# Patient Record
Sex: Male | Born: 1940
Health system: Southern US, Community
[De-identification: ages and names within clinical notes are randomized; demographics above are authoritative.]

## PROBLEM LIST (undated history)

## (undated) DIAGNOSIS — I2510 Atherosclerotic heart disease of native coronary artery without angina pectoris: Secondary | ICD-10-CM

## (undated) DIAGNOSIS — G2581 Restless legs syndrome: Secondary | ICD-10-CM

## (undated) DIAGNOSIS — F32A Depression, unspecified: Secondary | ICD-10-CM

## (undated) DIAGNOSIS — I10 Essential (primary) hypertension: Secondary | ICD-10-CM

## (undated) DIAGNOSIS — F329 Major depressive disorder, single episode, unspecified: Secondary | ICD-10-CM

## (undated) DIAGNOSIS — I255 Ischemic cardiomyopathy: Secondary | ICD-10-CM

## (undated) DIAGNOSIS — G4733 Obstructive sleep apnea (adult) (pediatric): Principal | ICD-10-CM

## (undated) DIAGNOSIS — Z9289 Personal history of other medical treatment: Secondary | ICD-10-CM

## (undated) DIAGNOSIS — C801 Malignant (primary) neoplasm, unspecified: Secondary | ICD-10-CM

## (undated) DIAGNOSIS — M1990 Unspecified osteoarthritis, unspecified site: Secondary | ICD-10-CM

## (undated) DIAGNOSIS — J302 Other seasonal allergic rhinitis: Secondary | ICD-10-CM

## (undated) DIAGNOSIS — E785 Hyperlipidemia, unspecified: Secondary | ICD-10-CM

## (undated) HISTORY — DX: Personal history of other medical treatment: Z92.89

## (undated) HISTORY — PX: TONSILLECTOMY: SUR1361

## (undated) HISTORY — PX: OTHER SURGICAL HISTORY: SHX169

## (undated) HISTORY — DX: Essential (primary) hypertension: I10

## (undated) HISTORY — DX: Malignant (primary) neoplasm, unspecified: C80.1

## (undated) HISTORY — DX: Depression, unspecified: F32.A

## (undated) HISTORY — DX: Hyperlipidemia, unspecified: E78.5

## (undated) HISTORY — DX: Other seasonal allergic rhinitis: J30.2

## (undated) HISTORY — DX: Restless legs syndrome: G25.81

## (undated) HISTORY — DX: Atherosclerotic heart disease of native coronary artery without angina pectoris: I25.10

## (undated) HISTORY — DX: Major depressive disorder, single episode, unspecified: F32.9

## (undated) HISTORY — PX: COLONOSCOPY: SHX174

## (undated) HISTORY — DX: Obstructive sleep apnea (adult) (pediatric): G47.33

## (undated) HISTORY — DX: Ischemic cardiomyopathy: I25.5

## (undated) HISTORY — PX: NOSE SURGERY: SHX723

## (undated) HISTORY — DX: Unspecified osteoarthritis, unspecified site: M19.90

---

## 2002-05-02 ENCOUNTER — Encounter (INDEPENDENT_AMBULATORY_CARE_PROVIDER_SITE_OTHER): Payer: Self-pay | Admitting: Specialist

## 2002-05-02 ENCOUNTER — Ambulatory Visit (HOSPITAL_COMMUNITY): Admission: RE | Admit: 2002-05-02 | Discharge: 2002-05-02 | Payer: Self-pay | Admitting: *Deleted

## 2003-12-01 ENCOUNTER — Encounter: Admission: RE | Admit: 2003-12-01 | Discharge: 2003-12-01 | Payer: Self-pay | Admitting: Internal Medicine

## 2007-09-25 ENCOUNTER — Ambulatory Visit (HOSPITAL_COMMUNITY): Admission: RE | Admit: 2007-09-25 | Discharge: 2007-09-25 | Payer: Self-pay | Admitting: *Deleted

## 2008-02-01 ENCOUNTER — Ambulatory Visit (HOSPITAL_BASED_OUTPATIENT_CLINIC_OR_DEPARTMENT_OTHER): Admission: RE | Admit: 2008-02-01 | Discharge: 2008-02-01 | Payer: Self-pay | Admitting: Orthopedic Surgery

## 2008-02-22 ENCOUNTER — Ambulatory Visit (HOSPITAL_BASED_OUTPATIENT_CLINIC_OR_DEPARTMENT_OTHER): Admission: RE | Admit: 2008-02-22 | Discharge: 2008-02-22 | Payer: Self-pay | Admitting: Orthopedic Surgery

## 2010-01-22 ENCOUNTER — Inpatient Hospital Stay (HOSPITAL_COMMUNITY): Admission: EM | Admit: 2010-01-22 | Discharge: 2010-01-24 | Payer: Self-pay | Admitting: Emergency Medicine

## 2010-01-22 ENCOUNTER — Ambulatory Visit: Payer: Self-pay | Admitting: Internal Medicine

## 2010-01-29 ENCOUNTER — Encounter: Payer: Self-pay | Admitting: Cardiovascular Disease

## 2010-02-08 ENCOUNTER — Encounter (INDEPENDENT_AMBULATORY_CARE_PROVIDER_SITE_OTHER): Payer: Self-pay | Admitting: *Deleted

## 2010-02-11 ENCOUNTER — Encounter (HOSPITAL_COMMUNITY): Admission: RE | Admit: 2010-02-11 | Discharge: 2010-03-26 | Payer: Self-pay | Admitting: Cardiovascular Disease

## 2010-02-11 ENCOUNTER — Encounter: Payer: Self-pay | Admitting: Cardiovascular Disease

## 2010-02-23 ENCOUNTER — Ambulatory Visit: Payer: Self-pay | Admitting: Cardiovascular Disease

## 2010-02-23 DIAGNOSIS — E7800 Pure hypercholesterolemia, unspecified: Secondary | ICD-10-CM | POA: Insufficient documentation

## 2010-02-23 DIAGNOSIS — I2510 Atherosclerotic heart disease of native coronary artery without angina pectoris: Secondary | ICD-10-CM | POA: Insufficient documentation

## 2010-02-24 ENCOUNTER — Encounter: Payer: Self-pay | Admitting: Cardiovascular Disease

## 2010-02-24 ENCOUNTER — Telehealth (INDEPENDENT_AMBULATORY_CARE_PROVIDER_SITE_OTHER): Payer: Self-pay | Admitting: *Deleted

## 2010-02-25 ENCOUNTER — Telehealth: Payer: Self-pay | Admitting: Cardiovascular Disease

## 2010-03-02 ENCOUNTER — Ambulatory Visit: Payer: Self-pay | Admitting: Cardiovascular Disease

## 2010-03-05 ENCOUNTER — Encounter: Payer: Self-pay | Admitting: Cardiology

## 2010-03-08 ENCOUNTER — Telehealth (INDEPENDENT_AMBULATORY_CARE_PROVIDER_SITE_OTHER): Payer: Self-pay | Admitting: *Deleted

## 2010-03-09 ENCOUNTER — Encounter: Payer: Self-pay | Admitting: Cardiology

## 2010-03-09 ENCOUNTER — Ambulatory Visit: Payer: Self-pay | Admitting: Cardiology

## 2010-03-09 ENCOUNTER — Encounter (HOSPITAL_COMMUNITY): Admission: RE | Admit: 2010-03-09 | Discharge: 2010-04-28 | Payer: Self-pay | Admitting: Internal Medicine

## 2010-03-09 ENCOUNTER — Ambulatory Visit: Payer: Self-pay

## 2010-03-10 ENCOUNTER — Telehealth: Payer: Self-pay | Admitting: Cardiovascular Disease

## 2010-03-12 ENCOUNTER — Ambulatory Visit (HOSPITAL_COMMUNITY): Admission: RE | Admit: 2010-03-12 | Discharge: 2010-03-12 | Payer: Self-pay | Admitting: Internal Medicine

## 2010-03-12 ENCOUNTER — Ambulatory Visit: Payer: Self-pay

## 2010-03-12 ENCOUNTER — Ambulatory Visit: Payer: Self-pay | Admitting: Internal Medicine

## 2010-03-16 ENCOUNTER — Telehealth: Payer: Self-pay | Admitting: Cardiovascular Disease

## 2010-03-24 ENCOUNTER — Telehealth: Payer: Self-pay | Admitting: Cardiovascular Disease

## 2010-03-26 ENCOUNTER — Ambulatory Visit: Payer: Self-pay | Admitting: Internal Medicine

## 2010-03-26 DIAGNOSIS — I495 Sick sinus syndrome: Secondary | ICD-10-CM | POA: Insufficient documentation

## 2010-03-27 ENCOUNTER — Encounter (HOSPITAL_COMMUNITY)
Admission: RE | Admit: 2010-03-27 | Discharge: 2010-06-14 | Payer: Self-pay | Source: Home / Self Care | Attending: Cardiovascular Disease | Admitting: Cardiovascular Disease

## 2010-04-07 ENCOUNTER — Encounter: Payer: Self-pay | Admitting: Cardiovascular Disease

## 2010-04-20 ENCOUNTER — Ambulatory Visit: Payer: Self-pay | Admitting: Cardiovascular Disease

## 2010-05-12 ENCOUNTER — Encounter: Payer: Self-pay | Admitting: Cardiovascular Disease

## 2010-07-02 ENCOUNTER — Encounter: Payer: Self-pay | Admitting: Cardiovascular Disease

## 2010-07-27 NOTE — Progress Notes (Signed)
Summary: questions re appt  Phone Note Call from Patient   Caller: Patient [phone] Reason for Call: Talk to Nurse Summary of Call: pt was referred to dr allred, by dr cooper for poss pacemaker, pt had echo first and he was told it was normal and he wouldn't need the pacemaker, does he still need to see dr allred? [phone] Initial call taken by: Glynda Jaeger,  March 24, 2010 10:35 AM  Follow-up for Phone Call        I spoke with the pt and made him aware that we would like him to keep his appt with Dr Johney Frame. The pt's echo was okay but he still needs to be evaluated for arrhythmia on holter monitor.  Follow-up by: Julieta Gutting, RN, BSN,  March 24, 2010 10:53 AM

## 2010-07-27 NOTE — Assessment & Plan Note (Signed)
Summary: 2 month rov   Visit Type:  Follow-up Referring Provider:  Dr Excell Seltzer Primary Provider:  Pearson Grippe, M.D.  CC:  No complaints.  History of Present Illness: 70 year-old male presents for followup evaluation of CAD following an inferior STEMI July 2011. His LVEF has returned to normal at 60%. He was evaluated by Dr Johney Frame for an idioventricular rhythm and bradycardia - he has had no symptoms since cessation of his beta-blocker. Pt is participating in cardiac rehab. He has no complaints today. The patient had a followup Myoview scan to assess residual CAD in the LAD and LCx territories and this showed no ischemia.  He is scheduled to see Dr Selena Batten next month with labwork at that time.  Current Medications (verified): 1)  Nitrostat 0.4 Mg Subl (Nitroglycerin) .Marland Kitchen.. 1 Tablet Under Tongue At Onset of Chest Pain; You May Repeat Every 5 Minutes For Up To 3 Doses. 2)  Effient 10 Mg Tabs (Prasugrel Hcl) .... Take One Tablet By Mouth Daily 3)  Lisinopril 5 Mg Tabs (Lisinopril) .... Take One Tablet By Mouth Daily 4)  Aspirin 81 Mg Tbec (Aspirin) .... Take One Tablet By Mouth Daily 5)  Crestor 40 Mg Tabs (Rosuvastatin Calcium) .... Take One Tablet By Mouth Daily. 6)  Allegra 180 Mg Tabs (Fexofenadine Hcl) .... Take 1 Tablet By Mouth Once A Day 7)  Calcium 600+d Plus Minerals 600-400 Mg-Unit Tabs (Calcium Carbonate-Vit D-Min) .... Take 1 Tablet By Mouth Once A Day 8)  Metformin Hcl 500 Mg Tabs (Metformin Hcl) .... Take 1 Tablet By Mouth Two Times A Day 9)  Multivitamins  Tabs (Multiple Vitamin) .... Take 1 Tablet By Mouth Once A Day 10)  Metamucil 30.9 % Powd (Psyllium) .... Once A Day 11)  Azelastine Hcl 137 Mcg/spray Soln (Azelastine Hcl) .... As Needed 12)  Perforomist 20 Mcg/35ml Nebu (Formoterol Fumarate) .... As Needed  Allergies (verified): No Known Drug Allergies  Past History:  Past medical history reviewed for relevance to current acute and chronic problems.  Past Medical  History: Reviewed history from 03/26/2010 and no changes required.  Acute inferior ST-elevation myocardial infarction July 2011 s/p PCI RCA  Non-insulin-dependent diabetes mellitus.   Hypertension.   Hyperlipidemia.   DJD  Seasonal Allergies  Review of Systems       Positive for episodic GI upset/diarrhea, chronic low back pain, otherwise negative except as per HPI.  Vital Signs:  Patient profile:   70 year old male Height:      69 inches Weight:      173.50 pounds BMI:     25.71 Pulse rate:   60 / minute Pulse rhythm:   regular Resp:     18 per minute BP sitting:   122 / 64  (left arm) Cuff size:   large  Vitals Entered By: Vikki Ports (April 20, 2010 10:01 AM)  Physical Exam  General:  Pt is alert and oriented, in no acute distress. HEENT: normal Neck: normal carotid upstrokes without bruits, JVP normal Lungs: CTA CV: RRR without murmur or gallop Abd: soft, NT, positive BS, no bruit, no organomegaly Ext: no clubbing, cyanosis, or edema. peripheral pulses 2+ and equal Skin: warm and dry without rash    Impression & Recommendations:  Problem # 1:  CORONARY ATHEROSCLEROSIS NATIVE CORONARY ARTERY (ICD-414.01) Pt stable post-MI. LVEF has normalized and he has no angina with activity. He is participitating in Phase 2 cardiac rehab. Would continue current Rx and will plan on followup in 6 months.  His updated medication list for this problem includes:    Nitrostat 0.4 Mg Subl (Nitroglycerin) .Marland Kitchen... 1 tablet under tongue at onset of chest pain; you may repeat every 5 minutes for up to 3 doses.    Effient 10 Mg Tabs (Prasugrel hcl) .Marland Kitchen... Take one tablet by mouth daily    Lisinopril 5 Mg Tabs (Lisinopril) .Marland Kitchen... Take one tablet by mouth daily    Aspirin 81 Mg Tbec (Aspirin) .Marland Kitchen... Take one tablet by mouth daily  Problem # 2:  HYPERCHOLESTEROLEMIA (ICD-272.0) Upcoming labs with Dr Selena Batten next month.  His updated medication list for this problem includes:    Crestor 40 Mg  Tabs (Rosuvastatin calcium) .Marland Kitchen... Take one tablet by mouth daily.  Problem # 3:  SINOATRIAL NODE DYSFUNCTION (ICD-427.81) HR improved off of beta blocker. Continue to follow.  His updated medication list for this problem includes:    Nitrostat 0.4 Mg Subl (Nitroglycerin) .Marland Kitchen... 1 tablet under tongue at onset of chest pain; you may repeat every 5 minutes for up to 3 doses.    Effient 10 Mg Tabs (Prasugrel hcl) .Marland Kitchen... Take one tablet by mouth daily    Lisinopril 5 Mg Tabs (Lisinopril) .Marland Kitchen... Take one tablet by mouth daily    Aspirin 81 Mg Tbec (Aspirin) .Marland Kitchen... Take one tablet by mouth daily  Patient Instructions: 1)  Your physician recommends that you continue on your current medications as directed. Please refer to the Current Medication list given to you today. 2)  Your physician wants you to follow-up in: 6 MONTHS.  You will receive a reminder letter in the mail two months in advance. If you don't receive a letter, please call our office to schedule the follow-up appointment.

## 2010-07-27 NOTE — Progress Notes (Signed)
Summary: Need Monitor  Phone Note Outgoing Call   Call placed by: Julieta Gutting, RN, BSN,  February 25, 2010 7:44 PM Call placed to: Patient Summary of Call: I spoke with the pt and made him aware that Dr Excell Seltzer reviewed his cardiac strips from rehab.  This showed an Idioventricular rhythm in the 60s.  Dr Excell Seltzer would like the pt to wear a 48 hour holter monitor.  Order sent to [facility] to schedule.  Pt aware that our office will call with appointment.  Initial call taken by: Julieta Gutting, RN, BSN,  February 25, 2010 7:44 PM

## 2010-07-27 NOTE — Miscellaneous (Signed)
Summary: update meds  Clinical Lists Changes  Medications: Added new medication of METOPROLOL SUCCINATE 25 MG XR24H-TAB (METOPROLOL SUCCINATE) Take one tablet by mouth daily Added new medication of NITROSTAT 0.4 MG SUBL (NITROGLYCERIN) 1 tablet under tongue at onset of chest pain; you may repeat every 5 minutes for up to 3 doses. Added new medication of EFFIENT 10 MG TABS (PRASUGREL HCL) Take one tablet by mouth daily Added new medication of LISINOPRIL 2.5 MG TABS (LISINOPRIL) Take one tablet by mouth daily Added new medication of ASPIRIN EC 325 MG TBEC (ASPIRIN) Take one tablet by mouth daily

## 2010-07-27 NOTE — Progress Notes (Signed)
Summary: Nuclear pre procedure  Phone Note Outgoing Call Call back at [facility] Phone [phone]   Call placed by: Rea College, CMA,  March 08, 2010 3:15 PM Call placed to: Patient Summary of Call: Left message with information on Myoview Information Sheet (see scanned document for details).      Nuclear Med Background Indications for Stress Test: Evaluation for Ischemia, Stent Patency, Post Hospital  Indications Comments: evaluation of LAD and LCFX  History: Angioplasty, Heart Catheterization, Myocardial Perfusion Study, Stents  History Comments: 7/11 Stent-RCA with moderate residual CAD  Symptoms: Chest Pain  Symptoms Comments: CP>jaw.   Nuclear Pre-Procedure Cardiac Risk Factors: Hypertension, Lipids, NIDDM Height (in): 69

## 2010-07-27 NOTE — Miscellaneous (Signed)
Summary: [place] [facility] Progress Note   [place] [facility] [facility] Progress Note   Imported By: Roderic Ovens 05/03/2010 09:14:17  _____________________________________________________________________  External Attachment:    Type:   Image     Comment:   External Document

## 2010-07-27 NOTE — Assessment & Plan Note (Signed)
Summary: Idioventricular Rhythm   Visit Type:  Initial Consult Referring Zachary Nolan:  Dr Excell Seltzer Primary Zachary Nolan:  Zachary Nolan, M.D.   History of Present Illness: [name] is a pleasant 70 year-old gentleman with a h/o CAD sp acute inferior MI requiring PCI of the RCA 7/11 who has subsequently been diagnosed with accelerated ideoventricular rhythm. He reports doing very well s/p MI.  He denies CP, SOB,  presyncope, or syncope.  He reports rate palpitations, worse with caffeine which he has had for years.  He reports being told previously that he had an "irregular heart beat".  He reports occasional orthostatic dizziness if he stands too quickly but otherwise does not have dizziness. During recent cardiac rehab, he was found to have a wide complex rhythm for which he was referred to Dr Excell Seltzer.  He reports that during this documentation, he felt normal.  He subsequently wore an event monitor.  His post-MI LVEF was in the range of 35-40%.  Repeat echo 03/12/10 revealed preserved EF.  He has done well since discharge home from the hospital and denies recurrent chest pain or dyspnea. He denies edema, orthopnea, or PND.  He has been walking about 30 minutes daily.  Current Medications (verified): 1)  Metoprolol Succinate 25 Mg Xr24h-Tab (Metoprolol Succinate) .... Take One Tablet By Mouth Daily 2)  Nitrostat 0.4 Mg Subl (Nitroglycerin) .Marland Kitchen.. 1 Tablet Under Tongue At Onset of Chest Pain; You May Repeat Every 5 Minutes For Up To 3 Doses. 3)  Effient 10 Mg Tabs (Prasugrel Hcl) .... Take One Tablet By Mouth Daily 4)  Lisinopril 5 Mg Tabs (Lisinopril) .... Take One Tablet By Mouth Daily 5)  Aspirin 81 Mg Tbec (Aspirin) .... Take One Tablet By Mouth Daily 6)  Crestor 40 Mg Tabs (Rosuvastatin Calcium) .... Take One Tablet By Mouth Daily. 7)  Allegra 180 Mg Tabs (Fexofenadine Hcl) .... Take 1 Tablet By Mouth Once A Day 8)  Calcium 600+d Plus Minerals 600-400 Mg-Unit Tabs (Calcium Carbonate-Vit D-Min) .... Take 1  Tablet By Mouth Once A Day 9)  Metformin Hcl 500 Mg Tabs (Metformin Hcl) .... Take 1 Tablet By Mouth Two Times A Day 10)  Multivitamins  Tabs (Multiple Vitamin) .... Take 1 Tablet By Mouth Once A Day 11)  Nexium 40 Mg Cpdr (Esomeprazole Magnesium) .... Take 1 Capsule By Mouth Once A Day 12)  Metamucil 30.9 % Powd (Psyllium) .... Once A Day 13)  Azelastine Hcl 137 Mcg/spray Soln (Azelastine Hcl) .... As Needed 14)  Perforomist 20 Mcg/44ml Nebu (Formoterol Fumarate) .... As Needed  Allergies (verified): No Known Drug Allergies  Past History:  Past Medical History:  Acute inferior ST-elevation myocardial infarction July 2011 s/p PCI RCA  Non-insulin-dependent diabetes mellitus.   Hypertension.   Hyperlipidemia.   DJD  Seasonal Allergies  Past Surgical History: Reviewed history from 02/19/2010 and no changes required. Percutaneous coronary    intervention using a drug-eluting stent (Promus).  Moderately    severe left anterior descending stenosis.  Moderate left     circumflex stenosis, moderate left ventricular dysfunction with   left ventricular ejection fraction of 35% to 40%.   Release of left transcarpal ligament.   Release of right transcarpal ligament.   Family History: Reviewed history from 02/19/2010 and no changes required.   Notable for extensive coronary disease in all of his  family members.  He states that none of his family members have lived as  long as he has, all have died from heart disease.  Denies sudden death.     Social History: Reviewed history from 02/19/2010 and no changes required. The patient lives at home in [place].  He is a  widow.  His mother lives at home with him.  He denies any tobacco,  alcohol or drug use.      Review of Systems       All systems are reviewed and negative except as listed in the HPI.   Vital Signs:  Patient profile:   70 year old male Height:      69 inches Weight:      174 pounds BMI:     25.79 Zachary Nolan rate:   51 /  minute BP sitting:   120 / 70  (left arm)  Vitals Entered By: Laurance Flatten CMA (March 26, 2010 9:14 AM)  Physical Exam  General:  well appearing, NAD Head:  normocephalic and atraumatic Eyes:  PERRLA/EOM intact; conjunctiva and lids normal. Mouth:  Teeth, gums and palate normal. Oral mucosa normal. Neck:  Neck supple, no JVD. No masses, thyromegaly or abnormal cervical nodes. Lungs:  Clear bilaterally to auscultation and percussion. Heart:  Non-displaced PMI, chest non-tender; regular rate and rhythm, S1, S2 without murmurs, rubs or gallops. Carotid upstroke normal, no bruit. Normal abdominal aortic size, no bruits. Femorals normal pulses, no bruits. Pedals normal pulses. No edema, no varicosities. Abdomen:  Bowel sounds positive; abdomen soft and non-tender without masses, organomegaly, or hernias noted. No hepatosplenomegaly. Msk:  Back normal, normal gait. Muscle strength and tone normal. Pulses:  pulses normal in all 4 extremities Extremities:  No clubbing or cyanosis. Neurologic:  Alert and oriented x 3. Skin:  Intact without lesions or rashes. Psych:  Normal affect.   EKG  Procedure date:  02/23/2010  Findings:      sinus arrhythmia with probable sinus node exit block,  inferior MI, diffuse TWI  Holter Monitor  Procedure date:  03/02/2010  Findings:      predominant rhythm is sinus.  HR range is 43-93 bpm (average 53),   evidence of sinus node dysfunction with exist block exists,  no prolonged pauses,  accelerated idioventricular rhythm observed     Nuclear Study  Procedure date:  03/09/2010  Findings:       Overall Impression   Exercise Capacity: Lexiscan with no exercise. BP Response: Normal blood pressure response. Clinical Symptoms: Lightheaded ECG Impression: PVCs, no other changes.  Overall Impression: Fixed basal to mid inferior perfusion defect likely represents prior MI.  No significant ischemia.     Signed by Marca Ancona, MD on 03/09/2010  at 6:37 PM  Cardiac Cath  Procedure date:  01/22/2010  Findings:       FINAL ASSESSMENT:   1. Critical right coronary artery stenosis with successful       percutaneous coronary intervention using a drug-eluting stent.   2. Moderately severe left anterior descending stenosis.   3. Moderate left circumflex stenosis.   4. Moderate left ventricular dysfunction with left ventricular       ejection fraction 35%-40%.      PLAN:  Recommend aspirin and Plavix for at least 12 months.  We will   consider staged PCI verses Myoview stress testing to evaluate LAD and   left circumflex territory ischemia.               Veverly Fells. Excell Seltzer, MD            MDC/MEDQ  D:  01/22/2010  T:  01/22/2010  Job:  [id number]  Echocardiogram  Procedure date:  01/22/2010  Findings:      Study Conclusions    - Left ventricle: The cavity size was normal. Wall thickness was     normal. Systolic function was normal. The estimated ejection     fraction was in the range of 60% to 65%.   - Mitral valve: Mild regurgitation.   - Left atrium: The atrium was moderately dilated.   Transthoracic echocardiography. M-mode, complete 2D, spectral   Doppler, and color Doppler. Height: Height: 175.3cm. Height: 69in.   Weight: Weight: 80.3kg. Weight: 176.6lb. Body mass index: BMI:   26.1kg/m^2. Body surface area: BSA: 1.63m^2. Blood pressure: 120/70.   Patient status: Outpatient. Location: [facility]  Left atrium                        gradient,   AP dim           47 mm     ------  S   AP dim index    2.4 cm/m^2 <2.2    HR            63 bpm       ------               Prepared and Electronically Authenticated by    Dietrich Pates, MD   [id number]     Impression & Recommendations:  Problem # 1:  SINOATRIAL NODE DYSFUNCTION (ICD-427.81) The patient has sinus node dysfunction with sinus  node exit block.  He has had no prolonged pauses, though EKG reveals an idioventricular rhythm at times.   Interestingly, the patient is asymptomatic with this.  His EF is preserved post MI and he has no ischemic symptoms.  At this point, I would recommend that we stop his beta blocker.  If he develops symptoms of bradycardia or has more ominimous sinus node dysfunction, then I would have a low threshold to place a pacemaker at that time.  I would not recommend medical therapy for his ideoventricular rhythm.  As his EF has recovered, he does not meet ICD implantation indication at this time.   He will follow closely with Dr Excell Seltzer and I will see him again should he develope further symptoms or arrhythmic issues.  Problem # 2:  CORONARY ATHEROSCLEROSIS NATIVE CORONARY ARTERY (ICD-414.01) stable without symptoms of ischemia no changes at this time  Patient Instructions: 1)  Your physician recommends that you schedule a follow-up appointment with Dr Excell Seltzer as scheduled 2)  Your physician has recommended you make the following change in your medication: stop Metoprolol

## 2010-07-27 NOTE — Letter (Signed)
Summary: [facility]  [facility]   Imported By: Marylou Mccoy 04/06/2010 08:12:32  _____________________________________________________________________  External Attachment:    Type:   Image     Comment:   External Document

## 2010-07-27 NOTE — Miscellaneous (Signed)
Summary: [facility] Physician Order/Treatment Plan   [facility] Order/Treatment Plan   Imported By: Roderic Ovens 02/03/2010 12:35:44  _____________________________________________________________________  External Attachment:    Type:   Image     Comment:   External Document

## 2010-07-27 NOTE — Procedures (Signed)
Summary: summary report  summary report   Imported By: Mirna Mires 03/10/2010 12:34:24  _____________________________________________________________________  External Attachment:    Type:   Image     Comment:   External Document

## 2010-07-27 NOTE — Assessment & Plan Note (Signed)
Summary: Cardiology Nuclear Testing  Nuclear Med Background Indications for Stress Test: Evaluation for Ischemia, Stent Patency, Post Hospital  Indications Comments: evaluation of LAD and LCFX  History: Angioplasty, Heart Catheterization, Myocardial Perfusion Study, Stents  History Comments: 7/11 Stent-RCA with moderate residual CAD  Symptoms: Chest Pain  Symptoms Comments: CP>jaw.   Nuclear Pre-Procedure Cardiac Risk Factors: Hypertension, Lipids, NIDDM Caffeine/Decaff Intake: None NPO After: 10:00 PM Lungs: clear IV 0.9% NS with Angio Cath: 20g     IV Site: R Antecubital IV Started by: Irean Hong, RN Chest Size (in) 42     Height (in): 69 Weight (lb): 173 BMI: 25.64 Tech Comments: Held metoprolol this am.  Nuclear Med Study 1 or 2 day study:  1 day     Stress Test Type:  Eugenie Birks Reading MD:  Marca Ancona, MD     Referring MD:  M.Cooper Resting Radionuclide:  Technetium 78m Tetrofosmin     Resting Radionuclide Dose:  11 mCi  Stress Radionuclide:  Technetium 68m Tetrofosmin     Stress Radionuclide Dose:  33 mCi   Stress Protocol   Lexiscan: 0.4 mg   Stress Test Technologist:  Milana Na, EMT-P     Nuclear Technologist:  Domenic Polite, CNMT  Rest Procedure  Myocardial perfusion imaging was performed at rest 45 minutes following the intravenous administration of Technetium 57m Tetrofosmin.  Stress Procedure  The patient received IV Lexiscan 0.4 mg over 15-seconds.  Technetium 36m Tetrofosmin injected at 30-seconds.  There were no significant changes and occ pvcs/V-Cuplets with infusion.  Quantitative spect images were obtained after a 45 minute delay.  QPS Raw Data Images:  Normal; no motion artifact; normal heart/lung ratio. Stress Images:  Mild basal to mid inferior perfusion defect.  Rest Images:  Mild basal to mid inferior perfusion defect.  Subtraction (SDS):  Mild fixed basal to mid inferior perfusion defect.  Transient Ischemic Dilatation:  0.95   (Normal <1.22)  Lung/Heart Ratio:  0.33  (Normal <0.45)  Quantitative Gated Spect Images QGS cine images:  Nongated study.    Overall Impression  Exercise Capacity: Lexiscan with no exercise. BP Response: Normal blood pressure response. Clinical Symptoms: Lightheaded ECG Impression: PVCs, no other changes.  Overall Impression: Fixed basal to mid inferior perfusion defect likely represents prior MI.  No significant ischemia.   Appended Document: Cardiology Nuclear Testing Pt aware of results by phone.  Pt scheduled for ECHO 03/12/10, Dr Johney Frame 03/26/10.

## 2010-07-27 NOTE — Assessment & Plan Note (Signed)
Summary: eph   Visit Type:  Post-hospital Primary Provider:  Pearson Grippe, M.D.  CC:  No cardiac complaints.  History of Present Illness: 70 year-old gentleman presenting for hospital follow-up evaluation, currently one month out from an inferior MI. The pt presented with an inferior STEMI and was treated with primary PCI using a drug-eluting stent in the right coronary artery. He was noted to have moderate residual CAD involving the LAD and LCx vessels. He had an uncomplicated post-MI course and was discharged home from the hospital after 48 hours. His post-MI LVEF was in the range of 35-40%.  He has done well since discharge home from the hospital and denies recurrent chest pain or dyspnea. He denies edema, orthopnea, or PND. He is eager to begin cardiac rehab. He has been walking about 30 minutes daily.  Current Medications (verified): 1)  Metoprolol Succinate 25 Mg Xr24h-Tab (Metoprolol Succinate) .... Take One Tablet By Mouth Daily 2)  Nitrostat 0.4 Mg Subl (Nitroglycerin) .Marland Kitchen.. 1 Tablet Under Tongue At Onset of Chest Pain; You May Repeat Every 5 Minutes For Up To 3 Doses. 3)  Effient 10 Mg Tabs (Prasugrel Hcl) .... Take One Tablet By Mouth Daily 4)  Lisinopril 2.5 Mg Tabs (Lisinopril) .... Take One Tablet By Mouth Daily 5)  Aspirin Ec 325 Mg Tbec (Aspirin) .... Take One Tablet By Mouth Daily 6)  Crestor 40 Mg Tabs (Rosuvastatin Calcium) .... Take One Tablet By Mouth Daily. 7)  Allegra 180 Mg Tabs (Fexofenadine Hcl) .... Take 1 Tablet By Mouth Once A Day 8)  Calcium 600+d Plus Minerals 600-400 Mg-Unit Tabs (Calcium Carbonate-Vit D-Min) .... Take 1 Tablet By Mouth Once A Day 9)  Metformin Hcl 500 Mg Tabs (Metformin Hcl) .... Take 1 Tablet By Mouth Two Times A Day 10)  Multivitamins  Tabs (Multiple Vitamin) .... Take 1 Tablet By Mouth Once A Day 11)  Nexium 40 Mg Cpdr (Esomeprazole Magnesium) .... Take 1 Capsule By Mouth Once A Day 12)  Metamucil 30.9 % Powd (Psyllium) .... Once A Day 13)   Azelastine Hcl 137 Mcg/spray Soln (Azelastine Hcl) .... As Needed 14)  Perforomist 20 Mcg/20ml Nebu (Formoterol Fumarate) .... As Needed  Allergies (verified): No Known Drug Allergies  Past History:  Past medical history reviewed for relevance to current acute and chronic problems.  Past Medical History:  Acute inferior ST-elevation myocardial infarction July 2011   Non-insulin-dependent diabetes mellitus.   Hypertension.   Hyperlipidemia.   Review of Systems       Negative except as per HPI   Vital Signs:  Patient profile:   70 year old male Height:      69 inches Weight:      177 pounds BMI:     26.23 Pulse rate:   57 / minute Pulse rhythm:   regular Resp:     18 per minute BP sitting:   120 / 70  (left arm) Cuff size:   large  Vitals Entered By: Vikki Ports (February 23, 2010 4:14 PM)  Physical Exam  General:  Pt is alert and oriented, in no acute distress. HEENT: normal Neck: normal carotid upstrokes without bruits, JVP normal Lungs: CTA CV: RRR without murmur or gallop Abd: soft, NT, positive BS, no bruit, no organomegaly Ext: no clubbing, cyanosis, or edema. peripheral pulses 2+ and equal Skin: warm and dry without rash    EKG  Procedure date:  02/23/2010  Findings:      NSR with atrial bigeminy, HR 57 bpm, ag-indeterminate inferior  MI.  Impression & Recommendations:  Problem # 1:  CORONARY ATHEROSCLEROSIS NATIVE CORONARY ARTERY (ICD-414.01) Pt is s/p inferior MI with moderate residual CAD. He is tolerating post-MI medical therapy well and is currently symptom-free. He is scheduled for an exercise Myoview stress test to rule out significant ischemia in the LAD or LCx territories. He is also scheduled for a followup echo, but if his LVEF is ok by [facility], will cancel the echo.  Recommend continue dual antiplatelet Rx with ASA and Effient (he was a plavix 'nonresponder'). Will decrease ASA dose to 81 mg daily in the presence of Effient. Also recommend  increase lisinopril to 5 mg daily. OK to start cardiac rehab as scheduled tomorrow. Will followup in 2 months.  His updated medication list for this problem includes:    Metoprolol Succinate 25 Mg Xr24h-tab (Metoprolol succinate) .Marland Kitchen... Take one tablet by mouth daily    Nitrostat 0.4 Mg Subl (Nitroglycerin) .Marland Kitchen... 1 tablet under tongue at onset of chest pain; you may repeat every 5 minutes for up to 3 doses.    Effient 10 Mg Tabs (Prasugrel hcl) .Marland Kitchen... Take one tablet by mouth daily    Lisinopril 5 Mg Tabs (Lisinopril) .Marland Kitchen... Take one tablet by mouth daily    Aspirin 81 Mg Tbec (Aspirin) .Marland Kitchen... Take one tablet by mouth daily  Orders: EKG w/ Interpretation (93000)  Problem # 2:  HYPERCHOLESTEROLEMIA (ICD-272.0) Followed by Dr Selena Batten. Goal LDL less than 70 mg/dL.  His updated medication list for this problem includes:    Crestor 40 Mg Tabs (Rosuvastatin calcium) .Marland Kitchen... Take one tablet by mouth daily.  Orders: EKG w/ Interpretation (93000)  Patient Instructions: 1)  Your physician recommends that you schedule a follow-up appointment in: 2 MONTHS 2)  Your physician has recommended you make the following change in your medication:  DECREASE Aspirin to 81mg  once a day, INCREASE Lisinopril 5mg  once a day Prescriptions: CRESTOR 40 MG TABS (ROSUVASTATIN CALCIUM) Take one tablet by mouth daily.  #90 x 3   Entered by:   Julieta Gutting, RN, BSN   Authorized by:   Norva Karvonen, MD   Signed by:   Julieta Gutting, RN, BSN on 02/23/2010   Method used:   Electronically to        [facility]  [address] [phone]* (retail)       [address]       [place], [place]  [address]       Ph: [phone] or [phone]       Fax: [phone]   RxID:   [id number] LISINOPRIL 5 MG TABS (LISINOPRIL) Take one tablet by mouth daily  #90 x 3   Entered by:   Julieta Gutting, RN, BSN   Authorized by:   Norva Karvonen, MD   Signed by:   Julieta Gutting, RN, BSN on 02/23/2010   Method  used:   Electronically to        [facility]  [address] [phone]* (retail)       [address]       [place], [place]  [address]       Ph: [phone] or [phone]       Fax: [phone]   RxID:   [id number] EFFIENT 10 MG TABS (PRASUGREL HCL) Take one tablet by mouth daily  #90 x 3   Entered by:   Julieta Gutting, RN, BSN   Authorized  by:   Norva Karvonen, MD   Signed by:   Julieta Gutting, RN, BSN on 02/23/2010   Method used:   [facility]ronically [facility]        [facility] [facility] [address] [phone]* (retail)       [address]       [place], [place]  [address]       Ph: [phone] or [phone]       Fax: [phone]   RxID:   [id number] METOPROLOL SUCCINATE 25 MG XR24H-TAB (METOPROLOL SUCCINATE) Take one tablet by mouth daily  #90 x 3   Entered by:   Julieta Gutting, RN, BSN   Authorized by:   Norva Karvonen, MD   Signed by:   Julieta Gutting, RN, BSN on 02/23/2010   Method used:   [facility]ronically to        [facility]  [address] [phone]* (retail)       [address]       [place], [place]  [id number]       Ph: [phone] or [phone]       Fax: [phone]   RxID:   [id number]

## 2010-07-27 NOTE — Progress Notes (Signed)
Summary: Echo results  Phone Note Call from Patient Call back at Home Phone [phone]   Caller: Patient Summary of Call: Echo results Initial call taken by: Judie Grieve,  March 16, 2010 9:27 AM  Follow-up for Phone Call        pt given results Meredith Staggers, RN  March 16, 2010 9:32 AM

## 2010-07-27 NOTE — Progress Notes (Signed)
  Phone Note From Other Clinic   Caller: [facility] Initial call taken by: KM    Faxed LOV, 12 lead to [phone] [facility]  February 24, 2010 8:53 AM

## 2010-07-27 NOTE — Progress Notes (Signed)
Summary: Holter Monitor Results  Phone Note Outgoing Call   Call placed by: Julieta Gutting, RN, BSN,  March 10, 2010 9:37 AM Call placed to: Patient Summary of Call: Left message for pt to call back. Julieta Gutting, RN, BSN  March 10, 2010 9:37 AM  Follow-up for Phone Call        I spoke with the pt and reviewed the results of his myoview and holter monitor.   Holter monitor showed NSR/SB and PVC's and long runs of AIVR vs slow juctional rhythm.  Dr Excell Seltzer recommeded 2D echo and then EP eval.  Would not push beta blockers with average HR 53 bpm.  The pt is scheduled for an echo in October and we will move this up and arrange EP appt. Order sent to [facility].  Follow-up by: Julieta Gutting, RN, BSN,  March 10, 2010 10:24 AM

## 2010-07-27 NOTE — Miscellaneous (Signed)
Summary: [facility]   [facility]   Imported By: Roderic Ovens 03/08/2010 13:19:11  _____________________________________________________________________  External Attachment:    Type:   Image     Comment:   External Document

## 2010-07-29 NOTE — Miscellaneous (Signed)
Summary: [facility] Progress Note   [facility]   Imported By: Roderic Ovens 07/19/2010 15:52:57  _____________________________________________________________________  External Attachment:    Type:   Image     Comment:   External Document

## 2010-09-09 LAB — GLUCOSE, CAPILLARY
Glucose-Capillary: 147 mg/dL — ABNORMAL HIGH (ref 70–99)
Glucose-Capillary: 77 mg/dL (ref 70–99)
Glucose-Capillary: 88 mg/dL (ref 70–99)

## 2010-09-10 LAB — GLUCOSE, CAPILLARY
Glucose-Capillary: 130 mg/dL — ABNORMAL HIGH (ref 70–99)
Glucose-Capillary: 98 mg/dL (ref 70–99)

## 2010-09-11 LAB — GLUCOSE, CAPILLARY
Glucose-Capillary: 110 mg/dL — ABNORMAL HIGH (ref 70–99)
Glucose-Capillary: 124 mg/dL — ABNORMAL HIGH (ref 70–99)
Glucose-Capillary: 163 mg/dL — ABNORMAL HIGH (ref 70–99)
Glucose-Capillary: 96 mg/dL (ref 70–99)

## 2010-09-11 LAB — POCT I-STAT, CHEM 8
Calcium, Ion: 1.24 mmol/L (ref 1.12–1.32)
Chloride: 108 mEq/L (ref 96–112)
Glucose, Bld: 164 mg/dL — ABNORMAL HIGH (ref 70–99)
HCT: 38 % — ABNORMAL LOW (ref 39.0–52.0)
Hemoglobin: 12.9 g/dL — ABNORMAL LOW (ref 13.0–17.0)
Sodium: 140 mEq/L (ref 135–145)
TCO2: 19 mmol/L (ref 0–100)

## 2010-09-11 LAB — BASIC METABOLIC PANEL
BUN: 15 mg/dL (ref 6–23)
BUN: 20 mg/dL (ref 6–23)
CO2: 23 mEq/L (ref 19–32)
Calcium: 8.7 mg/dL (ref 8.4–10.5)
Calcium: 9.6 mg/dL (ref 8.4–10.5)
Chloride: 102 mEq/L (ref 96–112)
Chloride: 105 mEq/L (ref 96–112)
Creatinine, Ser: 0.9 mg/dL (ref 0.4–1.5)
Creatinine, Ser: 0.98 mg/dL (ref 0.4–1.5)
GFR calc Af Amer: >60 mL/min (ref >60)
GFR calc non Af Amer: >60 mL/min (ref >60)
Glucose, Bld: 169 mg/dL — ABNORMAL HIGH (ref 70–99)
Glucose, Bld: 199 mg/dL — ABNORMAL HIGH (ref 70–99)
Potassium: 4.1 mEq/L (ref 3.5–5.1)

## 2010-09-11 LAB — LIPID PANEL
LDL Cholesterol: 79 mg/dL (ref 0–99)
VLDL: 28 mg/dL (ref 0–40)

## 2010-09-11 LAB — CBC
Hemoglobin: 14 g/dL (ref 13.0–17.0)
MCH: 32.7 pg (ref 26.0–34.0)
MCHC: 35.1 g/dL (ref 30.0–36.0)
MCV: 93.3 fL (ref 78.0–100.0)
MCV: 93.6 fL (ref 78.0–100.0)
Platelets: 160 10*3/uL (ref 150–400)
Platelets: 180 10*3/uL (ref 150–400)
RBC: 4.41 MIL/uL (ref 4.22–5.81)
RDW: 12.8 % (ref 11.5–15.5)
RDW: 12.9 % (ref 11.5–15.5)
WBC: 6.1 10*3/uL (ref 4.0–10.5)
WBC: 6.3 10*3/uL (ref 4.0–10.5)

## 2010-09-11 LAB — TROPONIN I: Troponin I: 1.18 ng/mL (ref 0.00–0.06)

## 2010-09-11 LAB — PROTIME-INR: Prothrombin Time: 11.5 seconds — ABNORMAL LOW (ref 11.6–15.2)

## 2010-09-11 LAB — CK TOTAL AND CKMB (NOT AT ARMC)
CK, MB: 10.2 ng/mL (ref 0.3–4.0)
Relative Index: 3.8 — ABNORMAL HIGH (ref 0.0–2.5)
Total CK: 272 U/L — ABNORMAL HIGH (ref 7–232)

## 2010-09-11 LAB — CARDIAC PANEL(CRET KIN+CKTOT+MB+TROPI): Troponin I: 2.97 ng/mL (ref 0.00–0.06)

## 2010-09-11 LAB — PLATELET INHIBITION P2Y12: Platelet Function  P2Y12: 330 [PRU] (ref 194–418)

## 2010-09-28 ENCOUNTER — Telehealth: Payer: Self-pay | Admitting: Internal Medicine

## 2010-09-28 ENCOUNTER — Encounter: Payer: Self-pay | Admitting: Cardiovascular Disease

## 2010-10-11 ENCOUNTER — Other Ambulatory Visit: Payer: Self-pay | Admitting: Dermatology

## 2010-11-01 ENCOUNTER — Encounter: Payer: Self-pay | Admitting: *Deleted

## 2010-11-02 ENCOUNTER — Ambulatory Visit (INDEPENDENT_AMBULATORY_CARE_PROVIDER_SITE_OTHER): Payer: Medicare Other | Admitting: Cardiovascular Disease

## 2010-11-02 ENCOUNTER — Encounter: Payer: Self-pay | Admitting: Cardiovascular Disease

## 2010-11-02 VITALS — BP 140/72 | HR 62 | Ht 69.0 in | Wt 182.0 lb

## 2010-11-02 DIAGNOSIS — I10 Essential (primary) hypertension: Secondary | ICD-10-CM | POA: Insufficient documentation

## 2010-11-02 DIAGNOSIS — E7800 Pure hypercholesterolemia, unspecified: Secondary | ICD-10-CM

## 2010-11-02 DIAGNOSIS — I2510 Atherosclerotic heart disease of native coronary artery without angina pectoris: Secondary | ICD-10-CM

## 2010-11-02 MED ORDER — LISINOPRIL 10 MG PO TABS: 10.0000 mg | ORAL_TABLET | Freq: Every day | ORAL | Status: DC

## 2010-11-02 NOTE — Assessment & Plan Note (Signed)
The patient reports elevation of his blood pressure at home. Lisinopril will be increased from 5 to 10 mg daily.

## 2010-11-02 NOTE — Patient Instructions (Signed)
Your physician recommends that you schedule a follow-up appointment in: 6 months with Dr. Theodoro Parma may stop your Effient 01/26/11  Your physician has recommended you make the following change in your medication:  INCREASE LISINOPRIL to 10 mg daily.

## 2010-11-02 NOTE — Progress Notes (Signed)
HPI:  This is a 70 year old gentleman presenting for followup evaluation. The patient presented with an inferior wall ST elevation infarction in July 2011.  He was treated with primary PCI using a 3.5 x 28 mm Promus drug-eluting stent. The patient is doing well at present. He denies chest pain, lightheadedness, palpitations, dyspnea, or syncope. He has been active but not engage in regular exercise. The patient had moderate residual CAD at the time of his catheterization and has subsequently undergone a followup nuclear study which was negative for ischemia. He brings in lab work today from Dr. Elmyra Ricks office dated September 13, 2010. His total cholesterol was 104 with an LDL of 51 and an HDL of 42. Triglycerides were 54.  Outpatient Encounter Prescriptions as of 11/02/2010  Medication Sig Dispense Refill  . aspirin 81 MG tablet Take 81 mg by mouth daily.        Marland Kitchen azelastine (ASTELIN) 137 MCG/SPRAY nasal spray 1 spray by Nasal route as needed. Use in each nostril as directed       . Calcium Carbonate-Vitamin D (CALCIUM 600 + D PO) Take by mouth daily.        . fexofenadine (ALLEGRA) 180 MG tablet Take 180 mg by mouth daily.        . formoterol (PERFOROMIST) 20 MCG/2ML nebulizer solution Take 20 mcg by nebulization as needed.        Marland Kitchen L-Methylfolate-B6-B12 (METANX PO) Take 180 mg by mouth 2 (two) times daily.        Marland Kitchen lisinopril (PRINIVIL,ZESTRIL) 10 MG tablet Take 1 tablet (10 mg total) by mouth daily.  90 tablet  3  . metFORMIN (GLUMETZA) 500 MG (MOD) 24 hr tablet Take 500 mg by mouth 2 (two) times daily with a meal.        . Multiple Vitamin (MULTIVITAMIN) capsule Take 1 capsule by mouth daily.        . nitroGLYCERIN (NITROSTAT) 0.4 MG SL tablet Place 0.4 mg under the tongue every 5 (five) minutes as needed.        . prasugrel (EFFIENT) 10 MG TABS 1 tab po qd       . psyllium (METAMUCIL) 58.6 % powder Take 1 packet by mouth daily.        . rosuvastatin (CRESTOR) 40 MG tablet Take 40 mg by mouth daily.         Marland Kitchen DISCONTD: lisinopril (PRINIVIL,ZESTRIL) 5 MG tablet Take 5 mg by mouth daily.          Allergies  Allergen Reactions  . Dust Mite Extract   . Pollen Extract   . Shellfish-Derived Products     JUST CRAB MEAT    Past Medical History  Diagnosis Date  . Myocardial infarction     Acute inferior ST-elevation myocardial infarction July 2011 s/p PCI RCA  . Diabetes mellitus     Non-insulin-dependent diabetes mellitus.   Marland Kitchen HTN (hypertension)   . Hyperlipemia   . DJD (degenerative joint disease)   . Seasonal allergies     ROS: Negative except as per HPI  BP 140/72  Pulse 62  Ht 5\' 9"  (1.753 m)  Wt 182 lb (82.555 kg)  BMI 26.88 kg/m2  PHYSICAL EXAM: Pt is alert and oriented, NAD HEENT: normal Neck: JVP - normal, carotids 2+= without bruits Lungs: CTA bilaterally CV: RRR without murmur or gallop Abd: soft, NT, Positive BS, no hepatomegaly Ext: no C/C/E Skin: warm/dry no rash  EKG: Sinus bradycardia with sinus arrhythmia, heart rate 56 beats  per minute, inferior infarction age undetermined  ASSESSMENT AND PLAN:

## 2010-11-02 NOTE — Assessment & Plan Note (Signed)
The patient is stable without angina will continue his current medical program. I advised he can discontinue Effient when he is out to 12 months from his infarction which will be July 2012. We discussed the very low risk of late stent thrombosis but when balancing this with increased bleeding risk agreed that discontinuing Effient at that time we'll be appropriate.

## 2010-11-02 NOTE — Assessment & Plan Note (Signed)
Lipids are excellent on his current medical program.

## 2010-11-09 NOTE — Op Note (Signed)
NAMEDYSHAWN, CANGELOSI NO.:  [id number]   MEDICAL RECORD NO.:  [id number]          PATIENT TYPE:  AMB   LOCATION:  ENDO                         FACILITY:  [facility]   PHYSICIAN:  Georgiana Spinner, M.D.    DATE OF BIRTH:  1940-11-13   DATE OF PROCEDURE:  DATE OF DISCHARGE:                               OPERATIVE REPORT   PROCEDURE:  Colonoscopy.   INDICATIONS:  Colon polyps.   ANESTHESIA:  Demerol 70 mg, Versed 7.5 mg.   PROCEDURE:  With the patient mildly sedated in the left lateral  decubitus position, a rectal examinations was performed which was  unremarkable, prostate felt normal.  Subsequently the Pentax videoscopic  colonoscope was inserted in the rectum and passed under direct vision  with pressure applied to reach the cecum identified by ileocecal valve  and appendiceal orifice, both of which were photographed. From this  point, the colonoscope was slowly withdrawn taking circumferential views  of the colonic mucosa stopping only in the rectum which appeared normal  on direct and showed hemorrhoids on retroflexed view. The endoscope was  straightened and withdrawn.  The patient's vital signs and pulse  oximeter remained stable.  The patient tolerated the procedure well  without apparent complications.   FINDINGS:  Diverticulosis mild scattered throughout the colon,  photographed in the right colon and internal hemorrhoids otherwise an  unremarkable exam.   PLAN:  Repeat examination in 5 years.           ______________________________  Georgiana Spinner, M.D.     GMO/MEDQ  D:  09/25/2007  T:  09/25/2007  Job:  [id number]

## 2010-11-09 NOTE — Op Note (Signed)
Zachary Nolan, Zachary Nolan                  ACCOUNT NO.:  [id number]   MEDICAL RECORD NO.:  [id number]          PATIENT TYPE:  AMB   LOCATION:  DSC                          FACILITY:  [facility]   PHYSICIAN:  Katy Fitch. Sypher, M.D. DATE OF BIRTH:  [birth year]   DATE OF PROCEDURE:  02/01/2008  DATE OF DISCHARGE:                               OPERATIVE REPORT   PREOPERATIVE DIAGNOSIS:  Entrapped neuropathy, median nerve, right  carpal tunnel.   POSTOPERATIVE DIAGNOSIS:  Entrapped neuropathy, median nerve, right  carpal tunnel.   OPERATION:  Release of right transcarpal ligament.   OPERATING SURGEON:  Katy Fitch. Sypher, MD.   ASSISTANT:  Marveen Reeks. Dasnoit, PA-C.   ANESTHESIA:  General by LMA.   SUPERVISING ANESTHESIOLOGIST:  Burna Forts, MD   INDICATIONS:  Zachary Nolan is a 70 year old gentleman referred through the  courtesy of Dr. Pearson Grippe for evaluation and management of hand numbness  and discomfort.  Zachary Nolan was noted to have background degenerative  arthritis of his wrist, cervical degenerative disk disease without sign  of radiculopathy, and bilateral carpal tunnel syndrome.   Electrodiagnostic studies confirmed significant median neuropathy  bilaterally.   He was advised to proceed with stage bilateral carpal tunnel release.   Preoperatively, he was advised that surgery would not relieve his  discomfort due to arthritis.  Our goal is to correct his nighttime  numbness and numbness in the median distribution.   Preoperatively, questions were invited and answered in detail.   PROCEDURE:  Zachary Nolan was brought to the operating room and placed in  the supine position upon the operating table.   Following an anesthesia consult with Dr. Jacklynn Bue, general anesthesia by  LMA technique was recommended and accepted.  Zachary Nolan was brought to the  operating room and placed in the supine position upon the operating  table and under Dr. Marlane Mingle direct supervision, general anesthesia  by  LMA technique was induced.   The right arm was prepped with Betadine soap solution and sterilely  draped.  Following exsanguination of the right arm with an Esmarch  bandage, an arterial tourniquet on the proximal right brachium was  inflated to 220 mmHg.  The procedure was commenced with a short incision  in the line of the ring finger of the palm.  Subcutaneous tissues were  carefully divided revealing the palmar fascia.  This was split with  scissors to reveal a common sensory branch of the median nerve.  A  Penfield #4 elevator was used to sound the carpal canal and separate the  median nerve proper from the synovium on the deep surface of the  transverse carpal ligament.  The limb was then released with scissors  into the distal forearm.   This widely opened the carpal canal.  No mass or other predicaments were  noted.  The ulnar bursa was quite fibrotic and thickened.   Bleeding points were inspected and found to be otherwise not  problematic.  The wound was then repaired with intradermal 3-0 Prolene  suture.   A compressive dressing was applied with a volar  plaster splint  maintaining the wrist in 5 degrees of dorsiflexion.   For aftercare, Zachary Nolan is provided with a prescription for Percocet 5  mg 1 p.o. q.4-6 h. p.r.n. pain, 20 tablets without refill.      Katy Fitch Sypher, M.D.  Electronically Signed     RVS/MEDQ  D:  02/01/2008  T:  02/01/2008  Job:  [id number]   cc:   Massie Maroon, MD

## 2010-11-09 NOTE — Op Note (Signed)
NAMETONY, GRANQUIST                  ACCOUNT NO.:  [id number]   MEDICAL RECORD NO.:  [id number]          PATIENT TYPE:  AMB   LOCATION:  DSC                          FACILITY:  [facility]   PHYSICIAN:  Katy Fitch. Sypher, M.D. DATE OF BIRTH:  1940/08/01   DATE OF PROCEDURE:  02/22/2008  DATE OF DISCHARGE:                               OPERATIVE REPORT   PREOPERATIVE DIAGNOSIS:  Chronic entrapped neuropathy, left median nerve  at carpal tunnel.   POSTOPERATIVE DIAGNOSIS:  Chronic entrapped neuropathy, left median  nerve at carpal tunnel.   OPERATIONS:  Release of left transcarpal ligament.   OPERATING SURGEON:  Katy Fitch. Sypher, MD   No assistant.   ANESTHESIA:  General by LMA.  Supervising anesthesiologist is W. Autumn Patty, MD   INDICATIONS:  Zachary Nolan is a 70 year old gentleman referred by Dr.  Pearson Grippe for evaluation and management of chronic numbness of his  hands.  He is status post right carpal tunnel release with good recovery  of sensibility.  He now presents for release of his left transcarpal  ligament.   He is noted to have type 2 diabetes.  He is allergic to CRABMEAT.   After informed consent, he is brought to the operating room at this  time.   PROCEDURE:  Zachary Nolan was brought to the operating room and placed in a  supine position on the operating table.   Following the induction of general anesthesia by LMA technique, the left  arm was prepped with Betadine soap solution and sterilely draped.  A  pneumatic tourniquet was applied to the proximal left brachium.  Following exsanguination of the left arm with an Esmarch bandage, an  arterial tourniquet was inflated to 220 mmHg.  The procedure commenced  with short incision in line of the ring finger of the palm.  Subcutaneous tissues were carefully divided between the palmar fascia.  This split longitudinally to reveal the common sensory branch of the  median nerve.  These were followed back to the  transcarpal ligament,  which was gently isolated from the median nerve.  The ligament was then  released along its ulnar border extending into the distal forearm.  This  widely opened the carpal canal.  No masses or other predicaments were  noted.   Bleeding points along the margin of the released ligament were  electrocauterized with bipolar current followed by repair of the skin  with intradermal 3-0 Prolene suture.   Compressive dressing applied with volar plaster splint maintaining the  wrist in 5 degrees of dorsiflexion.   For aftercare, Zachary Nolan has a prescription for Percocet.  He will use  this at home.  I will see him back in followup in a week for dressing  change and possible suture removal.      Katy Fitch. Sypher, M.D.  Electronically Signed     RVS/MEDQ  D:  02/22/2008  T:  02/22/2008  Job:  [id number]   cc:   Massie Maroon, MD

## 2010-11-12 NOTE — Op Note (Signed)
   NAMEMARVON, Zachary Nolan NO.:  [id number]   MEDICAL RECORD NO.:  [id number]                   PATIENT TYPE:  AMB   LOCATION:  ENDO                                 FACILITY:  [facility]   PHYSICIAN:  Georgiana Spinner, M.D.                 DATE OF BIRTH:  Jun 15, 1941   DATE OF PROCEDURE:  05/02/2002  DATE OF DISCHARGE:                                 OPERATIVE REPORT   PROCEDURE:  Upper endoscopy.   INDICATIONS:  GERD.   ANESTHESIA:  Demerol 50 mg, Versed 4 mg.   DESCRIPTION OF PROCEDURE:  With the patient mildly sedated in the left  lateral decubitus position, the Olympus videoscopic endoscope was inserted  in the mouth, passed under direct vision through the esophagus, which  appeared normal.  There was a beginning of a stricture, which was  photographed.  The endoscope was advanced into the stomach.  Fundus, body,  antrum, duodenal bulb, second portion of the duodenum all appeared normal.  From this point the endoscope was slowly withdrawn, taking circumferential  views of the entire duodenal mucosa, until the endoscope pulled back into  the stomach, placed in retroflexion to view the stomach from below.  The  endoscope was then straightened and withdrawn, taking circumferential views  of the remaining gastric and esophageal mucosa.  The patient's vital signs  and pulse oximetry remained stable.  The patient tolerated the procedure  well without apparent complications.   FINDINGS:  Early stricture of distal esophagus, otherwise unremarkable  examination.   PLAN:  Proceed to colonoscopy.                                               Georgiana Spinner, M.D.    GMO/MEDQ  D:  05/02/2002  T:  05/02/2002  Job:  [id number]   cc:   Maxwell Caul, M.D.

## 2010-11-12 NOTE — Op Note (Signed)
   NAMEASCHER, SCHROEPFER NO.:  [id number]   MEDICAL RECORD NO.:  [id number]                   PATIENT TYPE:  AMB   LOCATION:  ENDO                                 FACILITY:  [facility]   PHYSICIAN:  Georgiana Spinner, M.D.                 DATE OF BIRTH:  [birth year]   DATE OF PROCEDURE:  DATE OF DISCHARGE:                                 OPERATIVE REPORT   PROCEDURE PERFORMED:  Colonoscopy.   ENDOSCOPIST:  Georgiana Spinner, M.D.   INDICATIONS FOR PROCEDURE:  Colon cancer screening, colon polyp, hemoccult  positivity.   ANESTHESIA:  Demerol 20 mg, Versed 1 gm.   DESCRIPTION OF PROCEDURE:  With the patient mildly sedated in the left  lateral decubitus position, a rectal exam was performed which was  unremarkable.  Subsequently, the Olympus video colonoscope was inserted in  the rectum and passed under direct vision to the cecum, identified by the  ileocecal valve and appendiceal orifice, both of which were photographed.  From this point the colonoscope was slowly withdrawn taking circumferential  views of the entire colonic mucosa stopping in the rectum where a small  polyp was seen on retroflex view.  It was photographed, biopsied and removed  using hot biopsy forceps technique at a setting of 20/20 blended current.  The patient's vital signs and pulse oximeter remained stable.  The patient  tolerated the procedure well without apparent complications.   FINDINGS:  Small polyp or rectum.  Otherwise unremarkable examination.   PLAN:  Await biopsy report.  Patient will call me for results and follow up  with me as an outpatient.                                                  Georgiana Spinner, M.D.    GMO/MEDQ  D:  05/02/2002  T:  05/02/2002  Job:  [id number]

## 2011-03-04 ENCOUNTER — Other Ambulatory Visit: Payer: Self-pay | Admitting: Cardiovascular Disease

## 2011-03-25 LAB — BASIC METABOLIC PANEL
BUN: 13
CO2: 29
Chloride: 105
Creatinine, Ser: 0.82
Potassium: 5.5 — ABNORMAL HIGH

## 2011-05-06 ENCOUNTER — Encounter: Payer: Self-pay | Admitting: Cardiovascular Disease

## 2011-05-06 ENCOUNTER — Ambulatory Visit (INDEPENDENT_AMBULATORY_CARE_PROVIDER_SITE_OTHER): Payer: Medicare Other | Admitting: Cardiovascular Disease

## 2011-05-06 ENCOUNTER — Ambulatory Visit: Payer: PRIVATE HEALTH INSURANCE | Admitting: Cardiovascular Disease

## 2011-05-06 DIAGNOSIS — I10 Essential (primary) hypertension: Secondary | ICD-10-CM

## 2011-05-06 DIAGNOSIS — E7800 Pure hypercholesterolemia, unspecified: Secondary | ICD-10-CM

## 2011-05-06 DIAGNOSIS — I2510 Atherosclerotic heart disease of native coronary artery without angina pectoris: Secondary | ICD-10-CM

## 2011-05-06 MED ORDER — LISINOPRIL 20 MG PO TABS: 20.0000 mg | ORAL_TABLET | Freq: Every day | ORAL | Status: DC

## 2011-05-06 NOTE — Patient Instructions (Addendum)
Your physician wants you to follow-up in: 6 months.   You will receive a reminder letter in the mail two months in advance. If you don't receive a letter, please call our office to schedule the follow-up appointment.  Your physician recommends that you return for lab work in: 2 weeks 05/20/2011  for a BMP//401.1; 414.01  Your physician has recommended you make the following change in your medication: Increase Lisinopril to 20 mg daily.  Take one tablet by mouth daily.

## 2011-05-06 NOTE — Assessment & Plan Note (Signed)
Lipids are followed by Dr. Selena Batten. The patient reports that his lipids have been very good and he remains on Crestor 40 mg.

## 2011-05-06 NOTE — Progress Notes (Signed)
HPI:  The patient is a 70 year old gentleman returning for followup evaluation. He initially presented with an ST elevation inferior wall infarction in July 2011. He was treated with a drug-eluting stent in the right coronary artery. He was treated with 12 months of dual antiplatelet therapy and then discontinued effient after that. He remains on low dose aspirin and he is doing well at the present time. He denies chest pain or tightness. He denies shortness of breath, edema, lightheadedness, or palpitations. The patient just got remarried recently and he is very happy. He has gained some weight and has noted increased blood pressure recently, but he otherwise feels very well.  Outpatient Encounter Prescriptions as of 05/06/2011  Medication Sig Dispense Refill  . aspirin 81 MG tablet Take 81 mg by mouth daily.        Marland Kitchen azelastine (ASTELIN) 137 MCG/SPRAY nasal spray Place 1 spray into the nose as needed. Use in each nostril as directed       . Calcium Carbonate-Vitamin D (CALCIUM 600 + D PO) Take by mouth daily.        . CRESTOR 40 MG tablet TAKE ONE TABLET BY MOUTH DAILY.  90 tablet  3  . fexofenadine (ALLEGRA) 180 MG tablet Take 180 mg by mouth daily.        Marland Kitchen lisinopril (PRINIVIL,ZESTRIL) 10 MG tablet Take 1 tablet (10 mg total) by mouth daily.  90 tablet  3  . metFORMIN (GLUMETZA) 500 MG (MOD) 24 hr tablet Take 500 mg by mouth 2 (two) times daily with a meal.        . Multiple Vitamin (MULTIVITAMIN) capsule Take 1 capsule by mouth daily.        . nitroGLYCERIN (NITROSTAT) 0.4 MG SL tablet Place 0.4 mg under the tongue every 5 (five) minutes as needed.        . psyllium (METAMUCIL) 58.6 % powder Take 1 packet by mouth daily.        Marland Kitchen DISCONTD: formoterol (PERFOROMIST) 20 MCG/2ML nebulizer solution Take 20 mcg by nebulization as needed.        Marland Kitchen DISCONTD: L-Methylfolate-B6-B12 (METANX PO) Take 180 mg by mouth 2 (two) times daily.        Marland Kitchen DISCONTD: prasugrel (EFFIENT) 10 MG TABS 1 tab po qd         Allergies  Allergen Reactions  . Dust Mite Extract   . Pollen Extract   . Shellfish-Derived Products     JUST CRAB MEAT    Past Medical History  Diagnosis Date  . Myocardial infarction     Acute inferior ST-elevation myocardial infarction July 2011 s/p PCI RCA  . Diabetes mellitus     Non-insulin-dependent diabetes mellitus.   Marland Kitchen HTN (hypertension)   . Hyperlipemia   . DJD (degenerative joint disease)   . Seasonal allergies     ROS: Negative except as per HPI  BP 148/76  Pulse 58  Ht 5\' 9"  (1.753 m)  Wt 83.008 kg (183 lb)  BMI 27.02 kg/m2  PHYSICAL EXAM: Pt is alert and oriented, NAD HEENT: normal Neck: JVP - normal, carotids 2+= without bruits Lungs: CTA bilaterally CV: RRR without murmur or gallop Abd: soft, NT, Positive BS, no hepatomegaly Ext: no C/C/E, distal pulses intact and equal Skin: warm/dry no rash  EKG:  Sinus bradycardia 58 beats per minute, left axis deviation, otherwise within normal limits.  ASSESSMENT AND PLAN:

## 2011-05-06 NOTE — Assessment & Plan Note (Signed)
He is stable without angina. Continue secondary risk reduction measures. Lung discussion about the importance of lifestyle modification with exercise and diet. Will plan on followup in 6 months.

## 2011-05-06 NOTE — Assessment & Plan Note (Signed)
The patient's blood pressure is mildly elevated. I suspect this is related to his weight gain. Recommend increase lisinopril to 20 mg daily. He will work on weight loss as above.

## 2011-05-20 ENCOUNTER — Other Ambulatory Visit (INDEPENDENT_AMBULATORY_CARE_PROVIDER_SITE_OTHER): Payer: Medicare Other | Admitting: *Deleted

## 2011-05-20 DIAGNOSIS — I10 Essential (primary) hypertension: Secondary | ICD-10-CM

## 2011-05-20 LAB — BASIC METABOLIC PANEL
BUN: 21 mg/dL (ref 6–23)
CO2: 25 mEq/L (ref 19–32)
Chloride: 108 mEq/L (ref 96–112)
Glucose, Bld: 129 mg/dL — ABNORMAL HIGH (ref 70–99)
Potassium: 4.2 mEq/L (ref 3.5–5.1)
Sodium: 140 mEq/L (ref 135–145)

## 2011-07-01 DIAGNOSIS — J309 Allergic rhinitis, unspecified: Secondary | ICD-10-CM | POA: Diagnosis not present

## 2011-07-07 DIAGNOSIS — J309 Allergic rhinitis, unspecified: Secondary | ICD-10-CM | POA: Diagnosis not present

## 2011-07-12 DIAGNOSIS — J309 Allergic rhinitis, unspecified: Secondary | ICD-10-CM | POA: Diagnosis not present

## 2011-07-20 DIAGNOSIS — J309 Allergic rhinitis, unspecified: Secondary | ICD-10-CM | POA: Diagnosis not present

## 2011-07-25 DIAGNOSIS — J309 Allergic rhinitis, unspecified: Secondary | ICD-10-CM | POA: Diagnosis not present

## 2011-08-05 DIAGNOSIS — J309 Allergic rhinitis, unspecified: Secondary | ICD-10-CM | POA: Diagnosis not present

## 2011-08-09 DIAGNOSIS — J309 Allergic rhinitis, unspecified: Secondary | ICD-10-CM | POA: Diagnosis not present

## 2011-08-16 DIAGNOSIS — J309 Allergic rhinitis, unspecified: Secondary | ICD-10-CM | POA: Diagnosis not present

## 2011-08-23 DIAGNOSIS — R112 Nausea with vomiting, unspecified: Secondary | ICD-10-CM | POA: Diagnosis not present

## 2011-08-23 DIAGNOSIS — G4700 Insomnia, unspecified: Secondary | ICD-10-CM | POA: Diagnosis not present

## 2011-08-24 DIAGNOSIS — J309 Allergic rhinitis, unspecified: Secondary | ICD-10-CM | POA: Diagnosis not present

## 2011-08-24 DIAGNOSIS — E119 Type 2 diabetes mellitus without complications: Secondary | ICD-10-CM | POA: Diagnosis not present

## 2011-08-24 DIAGNOSIS — I10 Essential (primary) hypertension: Secondary | ICD-10-CM | POA: Diagnosis not present

## 2011-08-31 DIAGNOSIS — J309 Allergic rhinitis, unspecified: Secondary | ICD-10-CM | POA: Diagnosis not present

## 2011-09-06 DIAGNOSIS — K219 Gastro-esophageal reflux disease without esophagitis: Secondary | ICD-10-CM | POA: Diagnosis not present

## 2011-09-06 DIAGNOSIS — J309 Allergic rhinitis, unspecified: Secondary | ICD-10-CM | POA: Diagnosis not present

## 2011-09-06 DIAGNOSIS — G4700 Insomnia, unspecified: Secondary | ICD-10-CM | POA: Diagnosis not present

## 2011-09-12 DIAGNOSIS — J309 Allergic rhinitis, unspecified: Secondary | ICD-10-CM | POA: Diagnosis not present

## 2011-09-15 DIAGNOSIS — J309 Allergic rhinitis, unspecified: Secondary | ICD-10-CM | POA: Diagnosis not present

## 2011-09-19 DIAGNOSIS — J309 Allergic rhinitis, unspecified: Secondary | ICD-10-CM | POA: Diagnosis not present

## 2011-09-27 DIAGNOSIS — J309 Allergic rhinitis, unspecified: Secondary | ICD-10-CM | POA: Diagnosis not present

## 2011-10-03 DIAGNOSIS — J309 Allergic rhinitis, unspecified: Secondary | ICD-10-CM | POA: Diagnosis not present

## 2011-10-14 DIAGNOSIS — J309 Allergic rhinitis, unspecified: Secondary | ICD-10-CM | POA: Diagnosis not present

## 2011-10-17 DIAGNOSIS — J309 Allergic rhinitis, unspecified: Secondary | ICD-10-CM | POA: Diagnosis not present

## 2011-10-19 DIAGNOSIS — J309 Allergic rhinitis, unspecified: Secondary | ICD-10-CM | POA: Diagnosis not present

## 2011-10-24 DIAGNOSIS — J309 Allergic rhinitis, unspecified: Secondary | ICD-10-CM | POA: Diagnosis not present

## 2011-10-26 DIAGNOSIS — J309 Allergic rhinitis, unspecified: Secondary | ICD-10-CM | POA: Diagnosis not present

## 2011-10-31 DIAGNOSIS — J309 Allergic rhinitis, unspecified: Secondary | ICD-10-CM | POA: Diagnosis not present

## 2011-11-07 DIAGNOSIS — J309 Allergic rhinitis, unspecified: Secondary | ICD-10-CM | POA: Diagnosis not present

## 2011-11-16 DIAGNOSIS — J309 Allergic rhinitis, unspecified: Secondary | ICD-10-CM | POA: Diagnosis not present

## 2011-11-17 DIAGNOSIS — F331 Major depressive disorder, recurrent, moderate: Secondary | ICD-10-CM | POA: Diagnosis not present

## 2011-11-23 DIAGNOSIS — J309 Allergic rhinitis, unspecified: Secondary | ICD-10-CM | POA: Diagnosis not present

## 2011-11-29 ENCOUNTER — Ambulatory Visit: Payer: Medicare Other | Admitting: Cardiovascular Disease

## 2011-11-29 DIAGNOSIS — J309 Allergic rhinitis, unspecified: Secondary | ICD-10-CM | POA: Diagnosis not present

## 2011-12-02 ENCOUNTER — Encounter: Payer: Self-pay | Admitting: Cardiovascular Disease

## 2011-12-02 DIAGNOSIS — E7800 Pure hypercholesterolemia, unspecified: Secondary | ICD-10-CM | POA: Diagnosis not present

## 2011-12-02 DIAGNOSIS — Z125 Encounter for screening for malignant neoplasm of prostate: Secondary | ICD-10-CM | POA: Diagnosis not present

## 2011-12-02 DIAGNOSIS — I2510 Atherosclerotic heart disease of native coronary artery without angina pectoris: Secondary | ICD-10-CM | POA: Diagnosis not present

## 2011-12-02 DIAGNOSIS — E119 Type 2 diabetes mellitus without complications: Secondary | ICD-10-CM | POA: Diagnosis not present

## 2011-12-06 DIAGNOSIS — F331 Major depressive disorder, recurrent, moderate: Secondary | ICD-10-CM | POA: Diagnosis not present

## 2011-12-07 DIAGNOSIS — J309 Allergic rhinitis, unspecified: Secondary | ICD-10-CM | POA: Diagnosis not present

## 2011-12-07 DIAGNOSIS — E7800 Pure hypercholesterolemia, unspecified: Secondary | ICD-10-CM | POA: Diagnosis not present

## 2011-12-07 DIAGNOSIS — E1365 Other specified diabetes mellitus with hyperglycemia: Secondary | ICD-10-CM | POA: Diagnosis not present

## 2011-12-07 DIAGNOSIS — I10 Essential (primary) hypertension: Secondary | ICD-10-CM | POA: Diagnosis not present

## 2011-12-07 DIAGNOSIS — IMO0001 Reserved for inherently not codable concepts without codable children: Secondary | ICD-10-CM | POA: Diagnosis not present

## 2011-12-12 DIAGNOSIS — J309 Allergic rhinitis, unspecified: Secondary | ICD-10-CM | POA: Diagnosis not present

## 2011-12-14 DIAGNOSIS — IMO0001 Reserved for inherently not codable concepts without codable children: Secondary | ICD-10-CM | POA: Diagnosis not present

## 2011-12-20 DIAGNOSIS — J309 Allergic rhinitis, unspecified: Secondary | ICD-10-CM | POA: Diagnosis not present

## 2011-12-26 DIAGNOSIS — M19019 Primary osteoarthritis, unspecified shoulder: Secondary | ICD-10-CM | POA: Diagnosis not present

## 2011-12-28 DIAGNOSIS — J309 Allergic rhinitis, unspecified: Secondary | ICD-10-CM | POA: Diagnosis not present

## 2012-01-02 DIAGNOSIS — J309 Allergic rhinitis, unspecified: Secondary | ICD-10-CM | POA: Diagnosis not present

## 2012-01-03 DIAGNOSIS — F331 Major depressive disorder, recurrent, moderate: Secondary | ICD-10-CM | POA: Diagnosis not present

## 2012-01-13 DIAGNOSIS — J309 Allergic rhinitis, unspecified: Secondary | ICD-10-CM | POA: Diagnosis not present

## 2012-01-19 DIAGNOSIS — J309 Allergic rhinitis, unspecified: Secondary | ICD-10-CM | POA: Diagnosis not present

## 2012-01-19 DIAGNOSIS — E119 Type 2 diabetes mellitus without complications: Secondary | ICD-10-CM | POA: Diagnosis not present

## 2012-01-19 DIAGNOSIS — R0609 Other forms of dyspnea: Secondary | ICD-10-CM | POA: Diagnosis not present

## 2012-01-19 DIAGNOSIS — I10 Essential (primary) hypertension: Secondary | ICD-10-CM | POA: Diagnosis not present

## 2012-01-19 DIAGNOSIS — G4733 Obstructive sleep apnea (adult) (pediatric): Secondary | ICD-10-CM | POA: Diagnosis not present

## 2012-01-25 DIAGNOSIS — F331 Major depressive disorder, recurrent, moderate: Secondary | ICD-10-CM | POA: Diagnosis not present

## 2012-01-31 DIAGNOSIS — J309 Allergic rhinitis, unspecified: Secondary | ICD-10-CM | POA: Diagnosis not present

## 2012-02-06 DIAGNOSIS — J309 Allergic rhinitis, unspecified: Secondary | ICD-10-CM | POA: Diagnosis not present

## 2012-02-08 DIAGNOSIS — E119 Type 2 diabetes mellitus without complications: Secondary | ICD-10-CM | POA: Diagnosis not present

## 2012-02-08 DIAGNOSIS — R0989 Other specified symptoms and signs involving the circulatory and respiratory systems: Secondary | ICD-10-CM | POA: Diagnosis not present

## 2012-02-08 DIAGNOSIS — I10 Essential (primary) hypertension: Secondary | ICD-10-CM | POA: Diagnosis not present

## 2012-02-08 DIAGNOSIS — G4733 Obstructive sleep apnea (adult) (pediatric): Secondary | ICD-10-CM | POA: Diagnosis not present

## 2012-02-09 ENCOUNTER — Ambulatory Visit: Payer: Medicare Other | Admitting: Cardiovascular Disease

## 2012-02-16 DIAGNOSIS — J309 Allergic rhinitis, unspecified: Secondary | ICD-10-CM | POA: Diagnosis not present

## 2012-02-20 ENCOUNTER — Encounter: Payer: Self-pay | Admitting: Cardiovascular Disease

## 2012-02-20 ENCOUNTER — Ambulatory Visit (INDEPENDENT_AMBULATORY_CARE_PROVIDER_SITE_OTHER): Payer: Medicare Other | Admitting: Cardiovascular Disease

## 2012-02-20 VITALS — BP 153/87 | HR 63 | Ht 69.0 in | Wt 193.0 lb

## 2012-02-20 DIAGNOSIS — I10 Essential (primary) hypertension: Secondary | ICD-10-CM

## 2012-02-20 DIAGNOSIS — E7800 Pure hypercholesterolemia, unspecified: Secondary | ICD-10-CM

## 2012-02-20 DIAGNOSIS — I2510 Atherosclerotic heart disease of native coronary artery without angina pectoris: Secondary | ICD-10-CM

## 2012-02-20 DIAGNOSIS — J309 Allergic rhinitis, unspecified: Secondary | ICD-10-CM | POA: Diagnosis not present

## 2012-02-20 NOTE — Assessment & Plan Note (Signed)
The patient is stable without anginal symptoms. We need to refocus on lifestyle modification with weight loss and exercise. See below for discussion. He remains on aspirin for antiplatelet therapy. The patient has type 2 diabetes and we discussed the importance of weight loss as it relates to reducing his oral hypoglycemics and also better controlling his blood glucose levels.

## 2012-02-20 NOTE — Assessment & Plan Note (Signed)
Lipids were reviewed and his LDL was at goal when he was taking a statin drug. He is now off of medication. I recommended that he start back on a half dose of his Crestor as he was previously doing under the direction of Dr. Selena Batten. If he develops recurrent myalgias he did consider a trial of Crestor 5 mg daily as this is probably the best tolerated drug at the lowest dose. If intolerant then would continue to work with lifestyle modification. I will see him back in one year for followup.

## 2012-02-20 NOTE — Progress Notes (Signed)
   HPI:  71 year old gentleman presenting for followup evaluation. The patient has coronary artery disease and initially presented with an inferior wall MI in 2011. He was treated with primary PCI using a drug-eluting stent in the right coronary artery. Recent lipids were reviewed from Dr. Elmyra Ricks office and his total cholesterol was 136 with an LDL of 68 and an HDL of 38.  The patient has unfortunately continued to gain weight since I saw him last year. He's gained another 10 pounds. He then remarried and wife is a good cut. He had a lot of difficulty with lower extremity myalgias on statin drugs and he has now discontinued them. He started taking coenzyme Q10 and this helped him. He denies chest pain or pressure, palpitations, edema, orthopnea, or PND.  Outpatient Encounter Prescriptions as of 02/20/2012  Medication Sig Dispense Refill  . aspirin 81 MG tablet Take 81 mg by mouth daily.        Marland Kitchen azelastine (ASTELIN) 137 MCG/SPRAY nasal spray Place 1 spray into the nose as needed. Use in each nostril as directed       . Calcium Carbonate-Vitamin D (CALCIUM 600 + D PO) Take by mouth daily.        . Coenzyme Q10 (CO Q 10 PO) Take by mouth. 1 tab daily      . fexofenadine (ALLEGRA) 180 MG tablet Take 180 mg by mouth daily.        Marland Kitchen lisinopril (PRINIVIL,ZESTRIL) 20 MG tablet Take 1 tablet (20 mg total) by mouth daily.  90 tablet  3  . metFORMIN (GLUMETZA) 500 MG (MOD) 24 hr tablet Take 500 mg by mouth 2 (two) times daily with a meal.        . Multiple Vitamin (MULTIVITAMIN) capsule Take 1 capsule by mouth daily.        . nitroGLYCERIN (NITROSTAT) 0.4 MG SL tablet Place 0.4 mg under the tongue every 5 (five) minutes as needed.        . Omega-3 Fatty Acids (FISH OIL) 1000 MG CAPS Take by mouth. Take two tabs daily      . psyllium (METAMUCIL) 58.6 % powder Take 1 packet by mouth daily.        Marland Kitchen DISCONTD: CRESTOR 40 MG tablet TAKE ONE TABLET BY MOUTH DAILY.  90 tablet  3    Allergies  Allergen Reactions    . Dust Mite Extract   . Pollen Extract   . Shellfish-Derived Products     JUST CRAB MEAT    Past Medical History  Diagnosis Date  . Myocardial infarction     Acute inferior ST-elevation myocardial infarction July 2011 s/p PCI RCA  . Diabetes mellitus     Non-insulin-dependent diabetes mellitus.   Marland Kitchen HTN (hypertension)   . Hyperlipemia   . DJD (degenerative joint disease)   . Seasonal allergies     ROS: Negative except as per HPI  BP 153/87  Pulse 63  Ht 5\' 9"  (1.753 m)  Wt 193 lb (87.544 kg)  BMI 28.50 kg/m2  PHYSICAL EXAM: Pt is alert and oriented, NAD HEENT: normal Neck: JVP - normal, carotids 2+= without bruits Lungs: CTA bilaterally CV: RRR without murmur or gallop Abd: soft, NT, Positive BS, no hepatomegaly Ext: no C/C/E, distal pulses intact and equal Skin: warm/dry no rash  EKG:  Normal sinus rhythm 65 beats per minute, left axis deviation, age indeterminate inferior infarct the  ASSESSMENT AND PLAN:

## 2012-02-20 NOTE — Assessment & Plan Note (Signed)
Blood pressure is elevated today. I decided not to escalate his antihypertensive medication as I think he is committed to losing weight. He sees Dr. Selena Batten every 3 months so this can be followed closely. If he remains above goal I would consider starting him on a diuretic or beta blocker in conjunction with his ACE inhibitor.

## 2012-02-20 NOTE — Patient Instructions (Addendum)
Your physician wants you to follow-up in: 1 YEAR.  You will receive a reminder letter in the mail two months in advance. If you don't receive a letter, please call our office to schedule the follow-up appointment.  Please work on weight loss!!  Please restart your previous cholesterol medication at half of prescribed dose. (Pt will take Crestor 20mg  daily per PCP)

## 2012-03-01 ENCOUNTER — Encounter: Payer: Self-pay | Admitting: Cardiovascular Disease

## 2012-03-01 DIAGNOSIS — E1365 Other specified diabetes mellitus with hyperglycemia: Secondary | ICD-10-CM | POA: Diagnosis not present

## 2012-03-01 DIAGNOSIS — J309 Allergic rhinitis, unspecified: Secondary | ICD-10-CM | POA: Diagnosis not present

## 2012-03-08 DIAGNOSIS — I2510 Atherosclerotic heart disease of native coronary artery without angina pectoris: Secondary | ICD-10-CM | POA: Diagnosis not present

## 2012-03-08 DIAGNOSIS — I10 Essential (primary) hypertension: Secondary | ICD-10-CM | POA: Diagnosis not present

## 2012-03-08 DIAGNOSIS — Z23 Encounter for immunization: Secondary | ICD-10-CM | POA: Diagnosis not present

## 2012-03-08 DIAGNOSIS — E119 Type 2 diabetes mellitus without complications: Secondary | ICD-10-CM | POA: Diagnosis not present

## 2012-03-08 DIAGNOSIS — J309 Allergic rhinitis, unspecified: Secondary | ICD-10-CM | POA: Diagnosis not present

## 2012-03-08 DIAGNOSIS — E7800 Pure hypercholesterolemia, unspecified: Secondary | ICD-10-CM | POA: Diagnosis not present

## 2012-03-13 DIAGNOSIS — J309 Allergic rhinitis, unspecified: Secondary | ICD-10-CM | POA: Diagnosis not present

## 2012-03-19 DIAGNOSIS — J309 Allergic rhinitis, unspecified: Secondary | ICD-10-CM | POA: Diagnosis not present

## 2012-03-20 DIAGNOSIS — J309 Allergic rhinitis, unspecified: Secondary | ICD-10-CM | POA: Diagnosis not present

## 2012-03-26 DIAGNOSIS — J309 Allergic rhinitis, unspecified: Secondary | ICD-10-CM | POA: Diagnosis not present

## 2012-04-06 DIAGNOSIS — J309 Allergic rhinitis, unspecified: Secondary | ICD-10-CM | POA: Diagnosis not present

## 2012-04-09 DIAGNOSIS — J309 Allergic rhinitis, unspecified: Secondary | ICD-10-CM | POA: Diagnosis not present

## 2012-04-14 ENCOUNTER — Other Ambulatory Visit: Payer: Self-pay | Admitting: Cardiovascular Disease

## 2012-04-16 DIAGNOSIS — J309 Allergic rhinitis, unspecified: Secondary | ICD-10-CM | POA: Diagnosis not present

## 2012-04-18 DIAGNOSIS — J309 Allergic rhinitis, unspecified: Secondary | ICD-10-CM | POA: Diagnosis not present

## 2012-04-23 DIAGNOSIS — J309 Allergic rhinitis, unspecified: Secondary | ICD-10-CM | POA: Diagnosis not present

## 2012-04-27 DIAGNOSIS — J309 Allergic rhinitis, unspecified: Secondary | ICD-10-CM | POA: Diagnosis not present

## 2012-04-28 ENCOUNTER — Other Ambulatory Visit: Payer: Self-pay | Admitting: Cardiovascular Disease

## 2012-05-02 DIAGNOSIS — J309 Allergic rhinitis, unspecified: Secondary | ICD-10-CM | POA: Diagnosis not present

## 2012-05-07 DIAGNOSIS — J309 Allergic rhinitis, unspecified: Secondary | ICD-10-CM | POA: Diagnosis not present

## 2012-05-09 ENCOUNTER — Other Ambulatory Visit: Payer: Self-pay | Admitting: Dermatology

## 2012-05-09 DIAGNOSIS — L820 Inflamed seborrheic keratosis: Secondary | ICD-10-CM | POA: Diagnosis not present

## 2012-05-09 DIAGNOSIS — C44221 Squamous cell carcinoma of skin of unspecified ear and external auricular canal: Secondary | ICD-10-CM | POA: Diagnosis not present

## 2012-05-09 DIAGNOSIS — D485 Neoplasm of uncertain behavior of skin: Secondary | ICD-10-CM | POA: Diagnosis not present

## 2012-05-09 DIAGNOSIS — C44621 Squamous cell carcinoma of skin of unspecified upper limb, including shoulder: Secondary | ICD-10-CM | POA: Diagnosis not present

## 2012-05-09 DIAGNOSIS — L570 Actinic keratosis: Secondary | ICD-10-CM | POA: Diagnosis not present

## 2012-05-09 DIAGNOSIS — D234 Other benign neoplasm of skin of scalp and neck: Secondary | ICD-10-CM | POA: Diagnosis not present

## 2012-05-16 DIAGNOSIS — J309 Allergic rhinitis, unspecified: Secondary | ICD-10-CM | POA: Diagnosis not present

## 2012-05-17 ENCOUNTER — Other Ambulatory Visit: Payer: Self-pay | Admitting: Dermatology

## 2012-05-17 DIAGNOSIS — C44221 Squamous cell carcinoma of skin of unspecified ear and external auricular canal: Secondary | ICD-10-CM | POA: Diagnosis not present

## 2012-05-17 DIAGNOSIS — C44621 Squamous cell carcinoma of skin of unspecified upper limb, including shoulder: Secondary | ICD-10-CM | POA: Diagnosis not present

## 2012-05-17 DIAGNOSIS — L570 Actinic keratosis: Secondary | ICD-10-CM | POA: Diagnosis not present

## 2012-05-21 DIAGNOSIS — J309 Allergic rhinitis, unspecified: Secondary | ICD-10-CM | POA: Diagnosis not present

## 2012-05-29 DIAGNOSIS — J309 Allergic rhinitis, unspecified: Secondary | ICD-10-CM | POA: Diagnosis not present

## 2012-05-31 DIAGNOSIS — E119 Type 2 diabetes mellitus without complications: Secondary | ICD-10-CM | POA: Diagnosis not present

## 2012-05-31 DIAGNOSIS — H2510 Age-related nuclear cataract, unspecified eye: Secondary | ICD-10-CM | POA: Diagnosis not present

## 2012-06-04 ENCOUNTER — Encounter: Payer: Self-pay | Admitting: Cardiovascular Disease

## 2012-06-04 DIAGNOSIS — I10 Essential (primary) hypertension: Secondary | ICD-10-CM | POA: Diagnosis not present

## 2012-06-04 DIAGNOSIS — E119 Type 2 diabetes mellitus without complications: Secondary | ICD-10-CM | POA: Diagnosis not present

## 2012-06-06 DIAGNOSIS — J45909 Unspecified asthma, uncomplicated: Secondary | ICD-10-CM | POA: Diagnosis not present

## 2012-06-06 DIAGNOSIS — J301 Allergic rhinitis due to pollen: Secondary | ICD-10-CM | POA: Diagnosis not present

## 2012-06-06 DIAGNOSIS — J3089 Other allergic rhinitis: Secondary | ICD-10-CM | POA: Diagnosis not present

## 2012-06-06 DIAGNOSIS — J309 Allergic rhinitis, unspecified: Secondary | ICD-10-CM | POA: Diagnosis not present

## 2012-06-07 DIAGNOSIS — I10 Essential (primary) hypertension: Secondary | ICD-10-CM | POA: Diagnosis not present

## 2012-06-07 DIAGNOSIS — E7800 Pure hypercholesterolemia, unspecified: Secondary | ICD-10-CM | POA: Diagnosis not present

## 2012-06-07 DIAGNOSIS — Z0000 Encounter for general adult medical examination without abnormal findings: Secondary | ICD-10-CM | POA: Diagnosis not present

## 2012-06-07 DIAGNOSIS — E119 Type 2 diabetes mellitus without complications: Secondary | ICD-10-CM | POA: Diagnosis not present

## 2012-06-07 DIAGNOSIS — I2510 Atherosclerotic heart disease of native coronary artery without angina pectoris: Secondary | ICD-10-CM | POA: Diagnosis not present

## 2012-06-13 DIAGNOSIS — J309 Allergic rhinitis, unspecified: Secondary | ICD-10-CM | POA: Diagnosis not present

## 2012-06-14 ENCOUNTER — Other Ambulatory Visit: Payer: Self-pay | Admitting: Dermatology

## 2012-06-14 DIAGNOSIS — D485 Neoplasm of uncertain behavior of skin: Secondary | ICD-10-CM | POA: Diagnosis not present

## 2012-06-14 DIAGNOSIS — L821 Other seborrheic keratosis: Secondary | ICD-10-CM | POA: Diagnosis not present

## 2012-06-14 DIAGNOSIS — L820 Inflamed seborrheic keratosis: Secondary | ICD-10-CM | POA: Diagnosis not present

## 2012-06-22 DIAGNOSIS — J309 Allergic rhinitis, unspecified: Secondary | ICD-10-CM | POA: Diagnosis not present

## 2012-07-11 DIAGNOSIS — J309 Allergic rhinitis, unspecified: Secondary | ICD-10-CM | POA: Diagnosis not present

## 2012-07-17 DIAGNOSIS — J309 Allergic rhinitis, unspecified: Secondary | ICD-10-CM | POA: Diagnosis not present

## 2012-07-23 DIAGNOSIS — J309 Allergic rhinitis, unspecified: Secondary | ICD-10-CM | POA: Diagnosis not present

## 2012-07-31 DIAGNOSIS — J309 Allergic rhinitis, unspecified: Secondary | ICD-10-CM | POA: Diagnosis not present

## 2012-08-06 DIAGNOSIS — J309 Allergic rhinitis, unspecified: Secondary | ICD-10-CM | POA: Diagnosis not present

## 2012-08-16 DIAGNOSIS — I10 Essential (primary) hypertension: Secondary | ICD-10-CM | POA: Diagnosis not present

## 2012-08-16 DIAGNOSIS — R0609 Other forms of dyspnea: Secondary | ICD-10-CM | POA: Diagnosis not present

## 2012-08-16 DIAGNOSIS — E119 Type 2 diabetes mellitus without complications: Secondary | ICD-10-CM | POA: Diagnosis not present

## 2012-08-16 DIAGNOSIS — I2510 Atherosclerotic heart disease of native coronary artery without angina pectoris: Secondary | ICD-10-CM | POA: Diagnosis not present

## 2012-08-16 DIAGNOSIS — J309 Allergic rhinitis, unspecified: Secondary | ICD-10-CM | POA: Diagnosis not present

## 2012-08-23 ENCOUNTER — Emergency Department (HOSPITAL_COMMUNITY)
Admission: EM | Admit: 2012-08-23 | Discharge: 2012-08-23 | Disposition: A | Discharge status: Home / Self Care | Payer: Medicare Other | Attending: Emergency Medicine | Admitting: Emergency Medicine

## 2012-08-23 ENCOUNTER — Emergency Department (HOSPITAL_COMMUNITY): Payer: Medicare Other

## 2012-08-23 ENCOUNTER — Encounter (HOSPITAL_COMMUNITY): Payer: Self-pay | Admitting: Emergency Medicine

## 2012-08-23 DIAGNOSIS — Z79899 Other long term (current) drug therapy: Secondary | ICD-10-CM | POA: Insufficient documentation

## 2012-08-23 DIAGNOSIS — I252 Old myocardial infarction: Secondary | ICD-10-CM | POA: Diagnosis not present

## 2012-08-23 DIAGNOSIS — Z7982 Long term (current) use of aspirin: Secondary | ICD-10-CM | POA: Diagnosis not present

## 2012-08-23 DIAGNOSIS — J309 Allergic rhinitis, unspecified: Secondary | ICD-10-CM | POA: Diagnosis not present

## 2012-08-23 DIAGNOSIS — E785 Hyperlipidemia, unspecified: Secondary | ICD-10-CM | POA: Diagnosis not present

## 2012-08-23 DIAGNOSIS — Z8739 Personal history of other diseases of the musculoskeletal system and connective tissue: Secondary | ICD-10-CM | POA: Insufficient documentation

## 2012-08-23 DIAGNOSIS — E119 Type 2 diabetes mellitus without complications: Secondary | ICD-10-CM | POA: Diagnosis not present

## 2012-08-23 DIAGNOSIS — R079 Chest pain, unspecified: Secondary | ICD-10-CM | POA: Diagnosis not present

## 2012-08-23 DIAGNOSIS — Z87891 Personal history of nicotine dependence: Secondary | ICD-10-CM | POA: Insufficient documentation

## 2012-08-23 DIAGNOSIS — R072 Precordial pain: Secondary | ICD-10-CM | POA: Diagnosis not present

## 2012-08-23 DIAGNOSIS — I10 Essential (primary) hypertension: Secondary | ICD-10-CM | POA: Diagnosis not present

## 2012-08-23 LAB — COMPREHENSIVE METABOLIC PANEL
ALT: 20 U/L (ref 0–53)
Albumin: 4.6 g/dL (ref 3.5–5.2)
Alkaline Phosphatase: 69 U/L (ref 39–117)
BUN: 27 mg/dL — ABNORMAL HIGH (ref 6–23)
Chloride: 96 mEq/L (ref 96–112)
GFR calc Af Amer: >90 mL/min (ref >90)
Glucose, Bld: 117 mg/dL — ABNORMAL HIGH (ref 70–99)
Potassium: 4 mEq/L (ref 3.5–5.1)
Sodium: 133 mEq/L — ABNORMAL LOW (ref 135–145)
Total Bilirubin: 0.5 mg/dL (ref 0.3–1.2)
Total Protein: 8.1 g/dL (ref 6.0–8.3)

## 2012-08-23 LAB — POCT I-STAT TROPONIN I: Troponin i, poc: 0.03 ng/mL (ref 0.00–0.08)

## 2012-08-23 LAB — CBC
HCT: 42.1 % (ref 39.0–52.0)
Hemoglobin: 15.5 g/dL (ref 13.0–17.0)
MCHC: 36.8 g/dL — ABNORMAL HIGH (ref 30.0–36.0)
WBC: 6.8 10*3/uL (ref 4.0–10.5)

## 2012-08-23 MED ORDER — ASPIRIN 81 MG PO CHEW
324.0000 mg | CHEWABLE_TABLET | Freq: Once | ORAL | Status: AC
  Administered 2012-08-23: 324 mg via ORAL
  Filled 2012-08-23: qty 4

## 2012-08-23 NOTE — ED Notes (Signed)
C/o SSCP radiating into LUE today at 1200. States was sitting when it occurred & lasted approx 2 min. CP at its worst was 4/10. Denied SOB, diaphoresis. Had mild nausea, no emesis.  Reports last thursday had BP med doubled & had a new med added.  C/o "not feeling good", generalized weakness & intermittent dizziness x 2-3 days.  Denies fever, cold, cough.

## 2012-08-23 NOTE — ED Notes (Signed)
Triage by this RN

## 2012-08-23 NOTE — H&P (Signed)
History and Physical  Patient ID: Kartel Wolbert MRN: [id number], SOB: 1941-05-20 72 y.o. Date of Encounter: 08/23/2012, 2:42 PM  Primary Physician: Pearson Grippe, MD Primary Cardiologist: Tonny Bollman MD  Chief Complaint: chest pain  HPI: 72 y.o. male w/ PMHx significant for CAD (STEMI 12/2009 s/p DES to RCA w/ residual mod-severe LAD and mod LCx, nonischemic myoview 02/2010), HTN, HLD, and DMII who presented to [facility] on 08/23/2012 with complaints of chest pain.  Inferior STEMI in 12/2009 with cardiac cath showing critical distal RCA stenosis and mod-severe LAD, mod LCx, and EF 35-40%. He was treated with DES to the RCA and DAPT w/ ASA and Effient. Myoview was performed 02/2010 which showed basal to mid inferior perfusion defect consistent with inferior MI, but otherwise no ischemia. Echo in 02/2010 showed improved EF 60-65%, mod LAE. He stopped Effient after 12 months of therapy. Last evaluated in clinic by Dr. Excell Seltzer 01/2012 at which time he was stable from a cardiac standpoint.   He has been feeling well and in his usual state of health. Two weeks ago noted his BP was elevated at home so his PCP increased Lisinopril to 20mg  BID (previously 10mg  bid) and added HCTZ. His BP has slowly improved. His blood sugars have been stable in the 120-140s. He has been taking Pitavastatin for about the last 2 months. Reports compliance with all other medications. One week [place] and then a few days later drove home. He stopped frequently. Denies sob, palpitations, LE pain/swelling. Today while shopping with his wife he had sudden onset chest pain with radiation to his left arm. He describes the pain as a "discomfort". (+) nausea. Denies sob, diaphoresis, or dizziness. The pain lasted ~2 mins and resolved without intervention. His wife noted that he looked "horrible" afterwards prompting them to come to the ED. The pain did not feel like his pain prior to his MI. He also notes  that he has arthritis in his left shoulder and had an injection about one year ago. He has not had pain in this shoulder until last night. He reports pain in his neck and left shoulder that improved with massage. Denies recent fever, chills, orthopnea, PND, edema, syncope, abd pain, melena/hematochezia.   In the ED, EKG revealed NSR with no acute ST/T changes compared to previous EKG. CXR was without acute cardiopulmonary abnormalities. Labs are significant for normal troponin, unremarkable CBC/CMET. Received 324mg  ASA. He has not had any recurrent pain and is resting comfortably.   12/2009 - Cardiac Cath PROCEDURAL FINDINGS:  Aortic pressure 135/75 with a mean of 99, left ventricular pressure 131/12. Left ventriculography shows a large area of inferior wall akinesis extending to the apex.  The LVEF is estimated at 35%-40%. Coronary angiography:   The  left mainstem is widely patent.  It divides into the LAD and left circumflex.  There are luminal irregularities throughout the left main. LAD:  There are four diagonals arising from the LAD.  The proximal LAD is large and ectatic.  Just after the first diagonal branch, there is a 70%-75% calcific stenosis and the remaining portion of the vessel returned to normal caliber following stenotic region. There is diffuse plaquing throughout the mid and distal LAD, but no areas of high-grade stenosis. Left circumflex:  The left circumflex is much smaller than the LAD in caliber.  There is a 70% eccentric stenosis in the mid circumflex leading into two OM branches. Ccritical distal right coronary artery  stenosis with a 99% long segment lesion with intraluminal thrombus.   FINAL ASSESSMENT: 1. Critical right coronary artery stenosis with successful percutaneous coronary intervention using a drug-eluting stent. 2. Moderately severe left anterior descending stenosis. 3. Moderate left circumflex stenosis. 4. Moderate left ventricular dysfunction with left  ventricular ejection fraction 35%-40%. PLAN:  Recommend aspirin and Plavix for at least 12 months.  We will consider staged PCI verses Myoview stress testing to evaluate LAD and left circumflex territory ischemia.  02/2010 - Nuclear Stress Exercise Capacity: Lexiscan with no exercise.  BP Response: Normal blood pressure response.  Clinical Symptoms: Lightheaded  ECG Impression: PVCs, no other changes.  Overall Impression: Fixed basal to mid inferior perfusion defect likely represents prior MI. No significant ischemia.   02/2010 - Echo Study Conclusions: - Left ventricle: The cavity size was normal. Wall thickness was   normal. Systolic function was normal. The estimated ejection   fraction was in the range of 60% to 65%.  - Mitral valve: Mild regurgitation. - Left atrium: The atrium was moderately dilated.   Past Medical History  Diagnosis Date  . Myocardial infarction     Acute inferior ST-elevation myocardial infarction July 2011 s/p PCI RCA  . Diabetes mellitus     Non-insulin-dependent diabetes mellitus.   Marland Kitchen HTN (hypertension)   . Hyperlipemia   . DJD (degenerative joint disease)   . Seasonal allergies      Surgical History:  Past Surgical History  Procedure Laterality Date  . Release of left transcarpal ligament.    . Release of right transcarpal ligament.    . Percutaneous coronary intervention using a drug-eluting stent (promus)      Moderately    severe left anterior descending stenosis.  Moderate left     circumflex stenosis, moderate left ventricular dysfunction with   left  ventricular ejection fraction of 35% to 40%.      Home Meds: Medication Sig  aspirin 81 MG tablet Take 81 mg by mouth daily.    azelastine (ASTELIN) 137 MCG/SPRAY nasal spray Place 1 spray into the nose 2 (two) times daily as needed for rhinitis. Use in each nostril as directed  Calcium Carbonate-Vitamin D (CALCIUM 600 + D PO) Take 1 tablet by mouth daily.   CINNAMON PO Take 1,500 mg by mouth 2  (two) times daily.  Coenzyme Q10 (CO Q 10 PO) Take 1 capsule by mouth daily. 1 tab daily  glimepiride (AMARYL) 1 MG tablet Take 1 mg by mouth daily before breakfast.  hydrochlorothiazide (HYDRODIURIL) 25 MG tablet Take 25 mg by mouth daily.  lisinopril (PRINIVIL,ZESTRIL) 20 MG tablet Take 20 mg by mouth 2 (two) times daily.  metFORMIN (GLUCOPHAGE) 500 MG tablet Take 500 mg by mouth 2 (two) times daily with a meal.  Multiple Vitamin (MULTIVITAMIN) capsule Take 1 capsule by mouth daily.    nitroGLYCERIN (NITROSTAT) 0.4 MG SL tablet Place 0.4 mg under the tongue every 5 (five) minutes as needed for chest pain.   Omega-3 Fatty Acids (FISH OIL) 1000 MG CAPS Take 1 capsule by mouth 2 (two) times daily.   Pitavastatin Calcium (LIVALO) 2 MG TABS Take 2 mg by mouth daily.    Allergies:  Allergies  Allergen Reactions  . Dust Mite Extract   . Pollen Extract   . Shellfish-Derived Products     JUST CRAB MEAT    History   Social History  . Marital Status: Married    Spouse Name: N/A    Number of Children: N/A  .  Years of Education: N/A   Occupational History  . Not on file.   Social History Main Topics  . Smoking status: Former Games developer  . Smokeless tobacco: Not on file  . Alcohol Use: No  . Drug Use: No  . Sexually Active: Not on file   Other Topics Concern  . Not on file   Social History Narrative  . No narrative on file     Family History  Problem Relation Age of Onset  . Coronary artery disease      Review of Systems: General: negative for chills, fever, night sweats or weight changes.  Cardiovascular: As per HPI Dermatological: negative for rash Respiratory: negative for cough or wheezing Urologic: negative for hematuria Abdominal: negative for vomiting, diarrhea, bright red blood per rectum, melena, or hematemesis Neurologic: negative for visual changes, syncope, or dizziness All other systems reviewed and are otherwise negative except as noted above.  Labs:     Component Value Date   WBC 6.8 08/23/2012   HGB 15.5 08/23/2012   HCT 42.1 08/23/2012   MCV 89.0 08/23/2012   PLT 157 08/23/2012   Lab 08/23/12 1316  NA 133*  K 4.0  CL 96  CO2 23  BUN 27*  CREATININE 0.98  CALCIUM 10.6*  PROT 8.1  BILITOT 0.5  ALKPHOS 69  ALT 20  AST 18  GLUCOSE 117*      08/23/2012 13:16  Prothrombin Time 12.1  INR 0.90    08/23/2012 13:19 08/23/2012 15:11  Troponin I  <0.30  Troponin i, poc 0.03     Radiology/Studies:   08/23/2012 - CHEST - 2 VIEW   Findings: Lung volumes are normal.  Linear opacities in the periphery of the left base are most compatible with subsegmental atelectasis and/or scarring.  No acute consolidative airspace disease.  No pleural effusions.  Pulmonary vasculature and the cardiomediastinal silhouette are within normal limits.  IMPRESSION: 1.  No radiographic evidence of acute cardiopulmonary disease.      EKG: 08/23/12 @ 1234 - NSR with no acute ST/T changes compared to previous EKG  Physical Exam: Blood pressure 123/79, pulse 71, temperature 97.6 F (36.4 C), temperature source Oral, resp. rate 15, height 5\' 9"  (1.753 m), weight 186 lb (84.369 kg), SpO2 97.00%. General: Well developed, elderly white male in no acute distress. Head: Normocephalic, atraumatic, sclera non-icteric, nares are without discharge Neck: Supple. Negative for carotid bruits or JVD. Lungs: Clear bilaterally to auscultation without wheezes, rales, or rhonchi. Breathing is unlabored. Heart: RRR with S1 S2. No murmurs, rubs, or gallops appreciated. Abdomen: Soft, non-tender, non-distended with normoactive bowel sounds. No rebound/guarding. No obvious abdominal masses. Msk:  Strength and tone appear normal for age. Extremities: No edema or palpable cord. No clubbing or cyanosis. Distal pedal pulses are intact and equal bilaterally. Neuro: Alert and oriented X 3. Moves all extremities spontaneously. Psych:  Responds to questions appropriately with a normal affect.     ASSESSMENT AND PLAN:  72 y.o. male w/ PMHx significant for CAD (STEMI 12/2009 s/p DES to RCA w/ residual mod-severe LAD and mod LCx, nonischemic myoview 02/2010), HTN, HLD, and DMII who presented to [facility] on 08/23/2012 with complaints of chest pain.  1. Precordial pain 2. CAD - STEMI 12/2009 s/p DES to RCA w/ residual mod-severe LAD and mod LCx, nonischemic myoview 02/2010 3. Hypertension 4. Hyperlipidemia 5. Diabetes Mellitus, Type 2  Patient presents with an episode of chest pain that lasted ~2 minutes. It resolved without intervention and was not  similar to his pain prior to his MI. EKG is nonischemic and troponin is normal. He is not tachycardic, hypoxic, or tachypneic. Low suspicion for PE. He has not had any recurrent pain and is otherwise feeling well. Given his history of residual coronary disease will cycle cardiac enzymes and if normal plan for outpatient myoview. If enzymes turn positive will need admission with possible cardiac cath. Cont current meds.   Signed, HOPE, JESSICA PA-C 08/23/2012, 2:42 PM  Patient seen, examined. Available data reviewed. Agree with findings, assessment, and plan as outlined by [facility], PA-C. He is well-known to me on an outpatient basis. His exam shows an alert, oriented male in NAD. Lungs are clear and heart is RRR without murmur or gallop. There is no peripheral edema. EKG shows no acute changes and initial troponin is negative. His chest pain episode was very brief (<2 min). I have recommended a repeat troponin for 6 hour follow-up. If negative, could discharge home and we will do an outpatient Myoview stress test next week. He will likely require Lexiscan because of problems with arthritis.   Tonny Bollman, M.D. 08/23/2012 4:42 PM

## 2012-08-23 NOTE — ED Notes (Signed)
Pt c/o mid sternal CP with radiation to left arm and shoulder starting today; pt sts some htn x 2 weeks and dizziness x 3 days; pt sts some nausea today

## 2012-08-23 NOTE — ED Notes (Signed)
Karren Burly, cardiologist at bedside.

## 2012-08-23 NOTE — ED Provider Notes (Signed)
I saw and evaluated the patient, reviewed the resident's note and I agree with the findings and plan. I agree with the resident's EKG interpretation.  Episode of substernal chest pain radiating to L arm that lasted 2 minutes. Resolved without intervention. No SOB, nausea, diaphoresis. Hx MI with stent. Pain going to arm concerned him. EKG unchanged. No tachycaria, hypoxia, tachypnea. Doubt dissection or PE.  Glynn Octave, MD 08/23/12 1743

## 2012-08-23 NOTE — ED Provider Notes (Signed)
Results for orders placed during the hospital encounter of 08/23/12  CBC      Result Value Range   WBC 6.8  4.0 - 10.5 K/uL   RBC 4.73  4.22 - 5.81 MIL/uL   Hemoglobin 15.5  13.0 - 17.0 g/dL   HCT 45.4  09.8 - 11.9 %   MCV 89.0  78.0 - 100.0 fL   MCH 32.8  26.0 - 34.0 pg   MCHC 36.8 (*) 30.0 - 36.0 g/dL   RDW 14.7  82.9 - 56.2 %   Platelets 157  150 - 400 K/uL  COMPREHENSIVE METABOLIC PANEL      Result Value Range   Sodium 133 (*) 135 - 145 mEq/L   Potassium 4.0  3.5 - 5.1 mEq/L   Chloride 96  96 - 112 mEq/L   CO2 23  19 - 32 mEq/L   Glucose, Bld 117 (*) 70 - 99 mg/dL   BUN 27 (*) 6 - 23 mg/dL   Creatinine, Ser 1.30  0.50 - 1.35 mg/dL   Calcium 86.5 (*) 8.4 - 10.5 mg/dL   Total Protein 8.1  6.0 - 8.3 g/dL   Albumin 4.6  3.5 - 5.2 g/dL   AST 18  0 - 37 U/L   ALT 20  0 - 53 U/L   Alkaline Phosphatase 69  39 - 117 U/L   Total Bilirubin 0.5  0.3 - 1.2 mg/dL   GFR calc non Af Amer 80 (*) >90 mL/min   GFR calc Af Amer >90  >90 mL/min  PROTIME-INR      Result Value Range   Prothrombin Time 12.1  11.6 - 15.2 seconds   INR 0.90  0.00 - 1.49  TROPONIN I      Result Value Range   Troponin I <0.30  <0.30 ng/mL  TROPONIN I      Result Value Range   Troponin I <0.30  <0.30 ng/mL  POCT I-STAT TROPONIN I      Result Value Range   Troponin i, poc 0.03  0.00 - 0.08 ng/mL   Comment 3            DG CHEST 2 VIEW   Final Result:         4:44 PM Care transferred from Dr. Piedad Climes on 71yo w/ hx of CAD s/p 1 stent who presented with CP starting at11:30 AM, lasting about 3 mins.  Plan for 4hr delta troponin.  [place] will f/u w/ outpt stress if negative.   Second trop from 18:30 negative.  Pt remains pain free. He will f/u as outpt in clinic and for stress test.  Return precautions given for new or worsening symptoms.   1. Chest pain      Toy Cookey, MD 08/23/12 [phone]

## 2012-08-23 NOTE — ED Notes (Signed)
Pt undressed, in gown, on monitor, continuous pulse oximetry and blood pressure cuff; EKG was performed in triage; family at bedside

## 2012-08-23 NOTE — ED Notes (Signed)
PA with [facility] cardiology at bedside

## 2012-08-23 NOTE — ED Provider Notes (Signed)
History     CSN: [id number]  Arrival date & time 08/23/12  1231   First MD Initiated Contact with Patient 08/23/12 1241      Chief Complaint  Patient presents with  . Chest Pain    HPI Zachary Nolan is a 72 y.o. male who presented to the ED for concern of CP.  Patient has history of STEMI in 2011 with stenting.  Reports that CP occurred while sitting down.  L sided radiating to L arm.  Sharp.  Different from anginal equivalent.  3/10 in severity.  Resolved after 5 minutes without intervention.  No SOB.  No leg swelling.  No cough.  Now at baseline.  No other symptoms.  Past Medical History  Diagnosis Date  . Myocardial infarction     Acute inferior ST-elevation myocardial infarction July 2011 s/p PCI RCA  . Diabetes mellitus     Non-insulin-dependent diabetes mellitus.   Marland Kitchen HTN (hypertension)   . Hyperlipemia   . DJD (degenerative joint disease)   . Seasonal allergies     Past Surgical History  Procedure Laterality Date  . Release of left transcarpal ligament.    . Release of right transcarpal ligament.    . Percutaneous coronary intervention using a drug-eluting stent (promus)      Moderately    severe left anterior descending stenosis.  Moderate left     circumflex stenosis, moderate left ventricular dysfunction with   left  ventricular ejection fraction of 35% to 40%.     Family History  Problem Relation Age of Onset  . Coronary artery disease      History  Substance Use Topics  . Smoking status: Former Games developer  . Smokeless tobacco: Not on file  . Alcohol Use: No      Review of Systems  Constitutional: Negative for fever and chills.  HENT: Negative for congestion, sore throat and neck pain.   Respiratory: Negative for cough.   Cardiovascular: Positive for chest pain.  Gastrointestinal: Negative for nausea, vomiting, abdominal pain, diarrhea and constipation.  Endocrine: Negative for polyuria.  Genitourinary: Negative for dysuria and hematuria.  Skin: Negative  for rash.  Neurological: Negative for headaches.  Psychiatric/Behavioral: Negative.   All other systems reviewed and are negative.    Allergies  Dust mite extract; Pollen extract; and Shellfish-derived products  Home Medications   Current Outpatient Rx  Name  Route  Sig  Dispense  Refill  . aspirin 81 MG tablet   Oral   Take 81 mg by mouth daily.           Marland Kitchen azelastine (ASTELIN) 137 MCG/SPRAY nasal spray   Nasal   Place 1 spray into the nose as needed. Use in each nostril as directed          . Calcium Carbonate-Vitamin D (CALCIUM 600 + D PO)   Oral   Take by mouth daily.           . Coenzyme Q10 (CO Q 10 PO)   Oral   Take by mouth. 1 tab daily         . fexofenadine (ALLEGRA) 180 MG tablet   Oral   Take 180 mg by mouth daily.           Marland Kitchen lisinopril (PRINIVIL,ZESTRIL) 20 MG tablet      TAKE 1 TABLET BY MOUTH DAILY.   90 tablet   3   . lisinopril (PRINIVIL,ZESTRIL) 20 MG tablet      TAKE 1 TABLET BY  MOUTH DAILY.   90 tablet   3   . metFORMIN (GLUMETZA) 500 MG (MOD) 24 hr tablet   Oral   Take 500 mg by mouth 2 (two) times daily with a meal.           . Multiple Vitamin (MULTIVITAMIN) capsule   Oral   Take 1 capsule by mouth daily.           . nitroGLYCERIN (NITROSTAT) 0.4 MG SL tablet   Sublingual   Place 0.4 mg under the tongue every 5 (five) minutes as needed.           . Omega-3 Fatty Acids (FISH OIL) 1000 MG CAPS   Oral   Take by mouth. Take two tabs daily         . psyllium (METAMUCIL) 58.6 % powder   Oral   Take 1 packet by mouth daily.           . rosuvastatin (CRESTOR) 20 MG tablet   Oral   Take 1 tablet (20 mg total) by mouth at bedtime.   1 tablet   0     BP 123/79  Pulse 71  Temp(Src) 97.6 F (36.4 C) (Oral)  Resp 15  Ht 5\' 9"  (1.753 m)  Wt 186 lb (84.369 kg)  BMI 27.45 kg/m2  SpO2 97%  Physical Exam  Nursing note and vitals reviewed. Constitutional: He is oriented to person, place, and time. He appears  well-developed and well-nourished. No distress.  HENT:  Head: Normocephalic and atraumatic.  Right Ear: External ear normal.  Left Ear: External ear normal.  Mouth/Throat: Oropharynx is clear and moist. No oropharyngeal exudate.  Eyes: Conjunctivae are normal. Pupils are equal, round, and reactive to light. Right eye exhibits no discharge.  Neck: Normal range of motion. Neck supple. No tracheal deviation present.  Cardiovascular: Normal rate, regular rhythm and intact distal pulses.   Pulmonary/Chest: Effort normal. No respiratory distress. He has no wheezes. He has no rales.  Abdominal: Soft. He exhibits no distension. There is no tenderness. There is no rebound and no guarding.  Musculoskeletal: Normal range of motion.  Neurological: He is alert and oriented to person, place, and time.  Skin: Skin is warm and dry. No rash noted. He is not diaphoretic.  Psychiatric: He has a normal mood and affect.    ED Course  Procedures (including critical care time)  Labs Reviewed  CBC - Abnormal; Notable for the following:    MCHC 36.8 (*)    All other components within normal limits  COMPREHENSIVE METABOLIC PANEL - Abnormal; Notable for the following:    Sodium 133 (*)    Glucose, Bld 117 (*)    BUN 27 (*)    Calcium 10.6 (*)    GFR calc non Af Amer 80 (*)    All other components within normal limits  PROTIME-INR  TROPONIN I  POCT I-STAT TROPONIN I   Dg Chest 2 View  08/23/2012  *RADIOLOGY REPORT*  Clinical Data: Chest pain and left arm pain.  CHEST - 2 VIEW  Comparison: Chest x-ray 01/22/2010.  Findings: Lung volumes are normal.  Linear opacities in the periphery of the left base are most compatible with subsegmental atelectasis and/or scarring.  No acute consolidative airspace disease.  No pleural effusions.  Pulmonary vasculature and the cardiomediastinal silhouette are within normal limits.  IMPRESSION: 1.  No radiographic evidence of acute cardiopulmonary disease.   Original Report  Authenticated By: Trudie Reed, M.D.  Date: 08/23/2012  Rate: 82  Rhythm: normal sinus rhythm  QRS Axis: normal  Intervals: normal  ST/T Wave abnormalities: normal  Conduction Disutrbances:none  Narrative Interpretation:   Old EKG Reviewed: unchanged     1. Chest pain       MDM   Zachary Nolan is a 72 y.o. male with history of CAD who presented to the ED for concern of atypical CP.  Cardiology consulted and wanting delta trope.  Patient asymptomatic with normal vitals while here.  No concern for PE, dissection, ptx or other life trheSigned out to Dr. Micheline Maze to f/u with troponin at 1830.        Arloa Koh, MD 08/23/12 [phone]

## 2012-08-23 NOTE — ED Notes (Signed)
Results of troponin:  0.03 ng/mL  At 1319 hrs.

## 2012-08-23 NOTE — ED Notes (Signed)
Patient transported to X-ray 

## 2012-08-24 NOTE — ED Provider Notes (Signed)
I saw and evaluated the patient, reviewed the resident's note and I agree with the findings and plan.   Glynn Octave, MD 08/24/12 [phone]

## 2012-08-27 DIAGNOSIS — J309 Allergic rhinitis, unspecified: Secondary | ICD-10-CM | POA: Diagnosis not present

## 2012-08-28 ENCOUNTER — Encounter (HOSPITAL_COMMUNITY): Payer: Medicare Other

## 2012-08-29 ENCOUNTER — Ambulatory Visit (HOSPITAL_COMMUNITY): Payer: Medicare Other | Attending: Cardiology | Admitting: Radiology

## 2012-08-29 ENCOUNTER — Encounter (HOSPITAL_COMMUNITY): Payer: Medicare Other

## 2012-08-29 VITALS — BP 122/70 | Ht 69.0 in | Wt 190.0 lb

## 2012-08-29 DIAGNOSIS — I2510 Atherosclerotic heart disease of native coronary artery without angina pectoris: Secondary | ICD-10-CM

## 2012-08-29 DIAGNOSIS — R079 Chest pain, unspecified: Secondary | ICD-10-CM | POA: Insufficient documentation

## 2012-08-29 DIAGNOSIS — I491 Atrial premature depolarization: Secondary | ICD-10-CM

## 2012-08-29 MED ORDER — REGADENOSON 0.4 MG/5ML IV SOLN
0.4000 mg | Freq: Once | INTRAVENOUS | Status: AC
  Administered 2012-08-29: 0.4 mg via INTRAVENOUS

## 2012-08-29 MED ORDER — TECHNETIUM TC 99M SESTAMIBI GENERIC - CARDIOLITE
33.0000 | Freq: Once | INTRAVENOUS | Status: AC | PRN
  Administered 2012-08-29: 33 via INTRAVENOUS

## 2012-08-29 MED ORDER — TECHNETIUM TC 99M SESTAMIBI GENERIC - CARDIOLITE
11.0000 | Freq: Once | INTRAVENOUS | Status: AC | PRN
  Administered 2012-08-29: 11 via INTRAVENOUS

## 2012-08-29 NOTE — Progress Notes (Signed)
[facility] SITE 3 NUCLEAR MED [address] [place], [place] [address] [phone]    Cardiology Nuclear Med Study  Zachary Nolan is a 72 y.o. male     MRN : [id number]     DOB: July 04, 1940  Procedure Date: 08/29/2012  Nuclear Med Background Indication for Stress Test:  Evaluation for Ischemia,Stent Patency, and Patient seen in hospital on 08-23-12 for chest pain with radiation to (L) arm, and nausea, Enzymes negative History:  12/2009 MI-Inferior Stemi-Heart Cath: RCA 99%-Stent EF: 35-40% residual moderate Dz LAD CFX 70% in both, 02/2010 ECHO: EF: 60-65%,MPS: (-) ischemia inferior scar Cardiac Risk Factors: Family History - CAD, History of Smoking, Hypertension, Lipids and NIDDM  Symptoms:  Chest Pain, Dizziness, Nausea and Palpitations   Nuclear Pre-Procedure Caffeine/Decaff Intake:  None > 12 hrs NPO After: 8:00pm   Lungs:  clear O2 Sat: 96% on room air. IV 0.9% NS with Angio Cath:  22g  IV Site: R Antecubital x 1, tolerated well IV Started by:  Irean Hong, RN  Chest Size (in):  44 Cup Size: n/a  Height: 5\' 9"  (1.753 m)  Weight:  190 lb (86.183 kg)  BMI:  Body mass index is 28.05 kg/(m^2). Tech Comments:  FBS was 136 @ 7:30am;no diabetic medications today    Nuclear Med Study 1 or 2 day study: 1 day  Stress Test Type:  Treadmill/Lexiscan  Reading MD: Marca Ancona, MD  Order Authorizing Dj Senteno:  Tonny Bollman, MD  Resting Radionuclide: Technetium 76m Sestamibi  Resting Radionuclide Dose: 11.0 mCi   Stress Radionuclide:  Technetium 26m Sestamibi  Stress Radionuclide Dose: 33.0 mCi           Stress Protocol Rest HR: 71 Stress HR: 96  Rest BP: 122/70 Stress BP: 145/68  Exercise Time (min): n/a METS: n/a   Predicted Max HR: 148 bpm % Max HR: 64.19 bpm Rate Pressure Product: 21308   Dose of Adenosine (mg):  n/a Dose of Lexiscan: 0.4 mg  Dose of Atropine (mg): n/a Dose of Dobutamine: n/a mcg/kg/min (at max HR)  Stress Test Technologist: Milana Na,  EMT-P  Nuclear Technologist:  Domenic Polite, CNMT     Rest Procedure:  Myocardial perfusion imaging was performed at rest 45 minutes following the intravenous administration of Technetium 21m Sestamibi. Rest ECG: NSR - Normal EKG  Stress Procedure:  The patient received IV Lexiscan 0.4 mg over 15-seconds with concurrent low level exercise and then Technetium 37m Sestamibi was injected at 30-seconds while the patient continued walking one more minute. This patient had sob, abdominal bloating, and was lt. Headed with the Lexiscan infusion. Quantitative spect images were obtained after a 45-minute delay. Stress ECG: No significant change from baseline ECG  QPS Raw Data Images:  Normal; no motion artifact; normal heart/lung ratio. Stress Images:  Small, mild basal inferior perfusion defect.  Rest Images:  Small, mild basal inferior perfusion defect.  Subtraction (SDS):  Fixed, small mild basal inferior perfusion defect.  Transient Ischemic Dilatation (Normal <1.22):  0.91 Lung/Heart Ratio (Normal <0.45):  0.37   Quantitative Gated Spect Images QGS EDV:  81 ml QGS ESV:  34 ml  Impression Exercise Capacity:  Lexiscan with no exercise. BP Response:  Normal blood pressure response. Clinical Symptoms:  Short of breath, lightheaded.  ECG Impression:  No significant ST segment change suggestive of ischemia.  Occasional PVCs.  Comparison with Prior Nuclear Study: No significant change from previous study  Overall Impression:  Low risk stress nuclear study.  Fixed small  mild basal inferior perfusion defect.  Given history, this may represent a small area of prior infarction.  No ischemia.   LV Ejection Fraction: 59%.  LV Wall Motion:  NL LV Function; NL Wall Motion  Marca Ancona 08/29/2012

## 2012-08-30 DIAGNOSIS — E119 Type 2 diabetes mellitus without complications: Secondary | ICD-10-CM | POA: Diagnosis not present

## 2012-08-30 DIAGNOSIS — I10 Essential (primary) hypertension: Secondary | ICD-10-CM | POA: Diagnosis not present

## 2012-08-31 ENCOUNTER — Ambulatory Visit: Payer: Medicare Other | Admitting: Physician Assistant

## 2012-09-05 DIAGNOSIS — I2510 Atherosclerotic heart disease of native coronary artery without angina pectoris: Secondary | ICD-10-CM | POA: Diagnosis not present

## 2012-09-05 DIAGNOSIS — J309 Allergic rhinitis, unspecified: Secondary | ICD-10-CM | POA: Diagnosis not present

## 2012-09-05 DIAGNOSIS — I10 Essential (primary) hypertension: Secondary | ICD-10-CM | POA: Diagnosis not present

## 2012-09-05 DIAGNOSIS — E7800 Pure hypercholesterolemia, unspecified: Secondary | ICD-10-CM | POA: Diagnosis not present

## 2012-09-05 DIAGNOSIS — E119 Type 2 diabetes mellitus without complications: Secondary | ICD-10-CM | POA: Diagnosis not present

## 2012-09-11 ENCOUNTER — Ambulatory Visit: Payer: Medicare Other | Admitting: Physician Assistant

## 2012-09-11 DIAGNOSIS — J309 Allergic rhinitis, unspecified: Secondary | ICD-10-CM | POA: Diagnosis not present

## 2012-09-18 DIAGNOSIS — J309 Allergic rhinitis, unspecified: Secondary | ICD-10-CM | POA: Diagnosis not present

## 2012-09-27 DIAGNOSIS — J309 Allergic rhinitis, unspecified: Secondary | ICD-10-CM | POA: Diagnosis not present

## 2012-10-09 DIAGNOSIS — J309 Allergic rhinitis, unspecified: Secondary | ICD-10-CM | POA: Diagnosis not present

## 2012-10-11 DIAGNOSIS — J309 Allergic rhinitis, unspecified: Secondary | ICD-10-CM | POA: Diagnosis not present

## 2012-10-15 DIAGNOSIS — J309 Allergic rhinitis, unspecified: Secondary | ICD-10-CM | POA: Diagnosis not present

## 2012-10-18 DIAGNOSIS — J309 Allergic rhinitis, unspecified: Secondary | ICD-10-CM | POA: Diagnosis not present

## 2012-10-23 DIAGNOSIS — J309 Allergic rhinitis, unspecified: Secondary | ICD-10-CM | POA: Diagnosis not present

## 2012-11-02 DIAGNOSIS — J309 Allergic rhinitis, unspecified: Secondary | ICD-10-CM | POA: Diagnosis not present

## 2012-11-08 DIAGNOSIS — J309 Allergic rhinitis, unspecified: Secondary | ICD-10-CM | POA: Diagnosis not present

## 2012-11-12 DIAGNOSIS — J309 Allergic rhinitis, unspecified: Secondary | ICD-10-CM | POA: Diagnosis not present

## 2012-11-14 DIAGNOSIS — R29898 Other symptoms and signs involving the musculoskeletal system: Secondary | ICD-10-CM | POA: Diagnosis not present

## 2012-11-14 DIAGNOSIS — E7800 Pure hypercholesterolemia, unspecified: Secondary | ICD-10-CM | POA: Diagnosis not present

## 2012-11-15 ENCOUNTER — Other Ambulatory Visit: Payer: Self-pay | Admitting: Internal Medicine

## 2012-11-15 ENCOUNTER — Ambulatory Visit
Admission: RE | Admit: 2012-11-15 | Discharge: 2012-11-15 | Disposition: A | Discharge status: Home / Self Care | Payer: Medicare Other | Source: Ambulatory Visit | Attending: Internal Medicine | Admitting: Internal Medicine

## 2012-11-15 DIAGNOSIS — R29898 Other symptoms and signs involving the musculoskeletal system: Secondary | ICD-10-CM

## 2012-11-15 DIAGNOSIS — R5381 Other malaise: Secondary | ICD-10-CM | POA: Diagnosis not present

## 2012-11-22 DIAGNOSIS — J309 Allergic rhinitis, unspecified: Secondary | ICD-10-CM | POA: Diagnosis not present

## 2012-11-26 DIAGNOSIS — J309 Allergic rhinitis, unspecified: Secondary | ICD-10-CM | POA: Diagnosis not present

## 2012-12-03 DIAGNOSIS — J309 Allergic rhinitis, unspecified: Secondary | ICD-10-CM | POA: Diagnosis not present

## 2012-12-10 DIAGNOSIS — J309 Allergic rhinitis, unspecified: Secondary | ICD-10-CM | POA: Diagnosis not present

## 2012-12-26 ENCOUNTER — Other Ambulatory Visit: Payer: Self-pay | Admitting: Cardiovascular Disease

## 2012-12-27 DIAGNOSIS — J309 Allergic rhinitis, unspecified: Secondary | ICD-10-CM | POA: Diagnosis not present

## 2012-12-31 DIAGNOSIS — I10 Essential (primary) hypertension: Secondary | ICD-10-CM | POA: Diagnosis not present

## 2013-01-04 DIAGNOSIS — I10 Essential (primary) hypertension: Secondary | ICD-10-CM | POA: Diagnosis not present

## 2013-01-04 DIAGNOSIS — J309 Allergic rhinitis, unspecified: Secondary | ICD-10-CM | POA: Diagnosis not present

## 2013-01-04 DIAGNOSIS — IMO0001 Reserved for inherently not codable concepts without codable children: Secondary | ICD-10-CM | POA: Diagnosis not present

## 2013-01-04 DIAGNOSIS — E119 Type 2 diabetes mellitus without complications: Secondary | ICD-10-CM | POA: Diagnosis not present

## 2013-01-04 DIAGNOSIS — G2581 Restless legs syndrome: Secondary | ICD-10-CM | POA: Diagnosis not present

## 2013-01-08 DIAGNOSIS — J309 Allergic rhinitis, unspecified: Secondary | ICD-10-CM | POA: Diagnosis not present

## 2013-01-18 DIAGNOSIS — J309 Allergic rhinitis, unspecified: Secondary | ICD-10-CM | POA: Diagnosis not present

## 2013-01-30 DIAGNOSIS — E119 Type 2 diabetes mellitus without complications: Secondary | ICD-10-CM | POA: Diagnosis not present

## 2013-01-30 DIAGNOSIS — I10 Essential (primary) hypertension: Secondary | ICD-10-CM | POA: Diagnosis not present

## 2013-01-30 DIAGNOSIS — E7800 Pure hypercholesterolemia, unspecified: Secondary | ICD-10-CM | POA: Diagnosis not present

## 2013-01-30 DIAGNOSIS — M79609 Pain in unspecified limb: Secondary | ICD-10-CM | POA: Diagnosis not present

## 2013-01-31 DIAGNOSIS — J309 Allergic rhinitis, unspecified: Secondary | ICD-10-CM | POA: Diagnosis not present

## 2013-02-04 DIAGNOSIS — J309 Allergic rhinitis, unspecified: Secondary | ICD-10-CM | POA: Diagnosis not present

## 2013-02-05 DIAGNOSIS — J309 Allergic rhinitis, unspecified: Secondary | ICD-10-CM | POA: Diagnosis not present

## 2013-02-13 DIAGNOSIS — J309 Allergic rhinitis, unspecified: Secondary | ICD-10-CM | POA: Diagnosis not present

## 2013-02-27 ENCOUNTER — Ambulatory Visit (INDEPENDENT_AMBULATORY_CARE_PROVIDER_SITE_OTHER): Payer: Medicare Other | Admitting: Cardiovascular Disease

## 2013-02-27 ENCOUNTER — Encounter: Payer: Self-pay | Admitting: Cardiovascular Disease

## 2013-02-27 VITALS — BP 146/86 | HR 61 | Ht 69.0 in | Wt 198.0 lb

## 2013-02-27 DIAGNOSIS — J309 Allergic rhinitis, unspecified: Secondary | ICD-10-CM | POA: Diagnosis not present

## 2013-02-27 DIAGNOSIS — I2510 Atherosclerotic heart disease of native coronary artery without angina pectoris: Secondary | ICD-10-CM | POA: Diagnosis not present

## 2013-02-27 DIAGNOSIS — I10 Essential (primary) hypertension: Secondary | ICD-10-CM

## 2013-02-27 NOTE — Patient Instructions (Signed)
Your physician wants you to follow-up in: 1 YEAR with Dr Cooper.  You will receive a reminder letter in the mail two months in advance. If you don't receive a letter, please call our office to schedule the follow-up appointment.  Your physician recommends that you continue on your current medications as directed. Please refer to the Current Medication list given to you today.  

## 2013-02-27 NOTE — Progress Notes (Signed)
HPI:  72 year-old gentleman presenting for follow-up evaluation. The patient has coronary artery disease and initially presented with an inferior wall MI in 2011. He was treated with primary PCI using a drug-eluting stent in the right coronary artery.  Complains of leg weakness. Denies pain in his legs, but unable to walk far because of weakness. Has chronic pain but no recent exacerbation of pain. No numbness or tingling in the feet. No chest pain or dyspnea. Hasn't been able to lose weight. Reports compliance with medications. Hasn't been able to take statin drugs because of myalgias.   Outpatient Encounter Prescriptions as of 02/27/2013  Medication Sig Dispense Refill  . aspirin 81 MG tablet Take 81 mg by mouth daily.        Marland Kitchen azelastine (ASTELIN) 137 MCG/SPRAY nasal spray Place 1 spray into the nose 2 (two) times daily as needed for rhinitis. Use in each nostril as directed      . Calcium Carbonate-Vitamin D (CALCIUM 600 + D PO) Take 1 tablet by mouth daily.       Marland Kitchen CINNAMON PO Take 1,500 mg by mouth 2 (two) times daily.      . Coenzyme Q10 (CO Q 10 PO) Take 1 capsule by mouth daily. 1 tab daily      . ezetimibe (ZETIA) 10 MG tablet Take 10 mg by mouth daily.      Marland Kitchen glimepiride (AMARYL) 1 MG tablet Take 1 mg by mouth daily before breakfast.      . hydrochlorothiazide (HYDRODIURIL) 25 MG tablet Take 25 mg by mouth daily.      Marland Kitchen lisinopril (PRINIVIL,ZESTRIL) 20 MG tablet Take 20 mg by mouth 2 (two) times daily.      . metFORMIN (GLUCOPHAGE) 500 MG tablet TAKE ONE TABLET IN THE MORNING AND 2 TABLET IN THE AFTERNOON DAILY      . Multiple Vitamin (MULTIVITAMIN) capsule Take 1 capsule by mouth daily.        Marland Kitchen NITROSTAT 0.4 MG SL tablet DISSOLVE 1 TABLET UNDER TONGUE EVERY 5 MINTUES AS NEEDED UP TO 3 DOSES  25 tablet  1  . Omega-3 Fatty Acids (FISH OIL) 1000 MG CAPS Take 1 capsule by mouth 2 (two) times daily.       . Omeprazole Magnesium (PRILOSEC OTC PO) Take by mouth. TAKE 20.6MG  DAILY AS  NEEDED      . [DISCONTINUED] Pitavastatin Calcium (LIVALO) 2 MG TABS Take 2 mg by mouth daily.       No facility-administered encounter medications on file as of 02/27/2013.    Allergies  Allergen Reactions  . Dust Mite Extract   . Pollen Extract   . Shellfish-Derived Products     JUST CRAB MEAT    Past Medical History  Diagnosis Date  . Myocardial infarction     Acute inferior ST-elevation myocardial infarction July 2011 s/p PCI RCA  . Diabetes mellitus     Non-insulin-dependent diabetes mellitus.   Marland Kitchen HTN (hypertension)   . Hyperlipemia   . DJD (degenerative joint disease)   . Seasonal allergies     ROS: Negative except as per HPI  BP 146/86  Pulse 61  Ht 5\' 9"  (1.753 m)  Wt 198 lb (89.812 kg)  BMI 29.23 kg/m2  SpO2 97%  PHYSICAL EXAM: Pt is alert and oriented, NAD HEENT: normal Neck: JVP - normal, carotids 2+= without bruits Lungs: CTA bilaterally CV: RRR without murmur or gallop Abd: soft, NT, Positive BS, no hepatomegaly Ext: no C/C/E, distal pulses  intact and equal Skin: warm/dry no rash  EKG:  Sinus rhythm with PAC's, age-indeterminate inferior infarct  ASSESSMENT AND PLAN: 1. CAD, native vessel. Stable without anginal symptoms. He's on appropriate med Rx. meds limited by bradycardia (no beta-blocker) and statin intolerance. He is on ASA 81 mg daily. Will follow-up in one year.  2. Leg weakness with exertion. Peripheral pulse exam is normal. I don't think this is related to vascular disease. Question lumbar-spine disease versus deconditioning versus med-related (was on statin drugs but now off).   3. Hyperlipidemia: don't think he will ever be able to take statins. He's been on several including Livalo with side-effects. On zetia. Followed by Dr Selena Batten.  4. Overweight. I showed him his weights since 2011. He's gained 25#. Suspect this is contributing to his inability to walk longer distances. He will refocus efforts at exercise and weight loss.  For follow-up  I will see him back in 12 months.  Tonny Bollman 02/27/2013 12:26 PM

## 2013-03-05 DIAGNOSIS — M19019 Primary osteoarthritis, unspecified shoulder: Secondary | ICD-10-CM | POA: Diagnosis not present

## 2013-03-08 DIAGNOSIS — J309 Allergic rhinitis, unspecified: Secondary | ICD-10-CM | POA: Diagnosis not present

## 2013-03-21 DIAGNOSIS — J309 Allergic rhinitis, unspecified: Secondary | ICD-10-CM | POA: Diagnosis not present

## 2013-03-27 DIAGNOSIS — J309 Allergic rhinitis, unspecified: Secondary | ICD-10-CM | POA: Diagnosis not present

## 2013-04-23 DIAGNOSIS — D485 Neoplasm of uncertain behavior of skin: Secondary | ICD-10-CM | POA: Diagnosis not present

## 2013-04-23 DIAGNOSIS — L570 Actinic keratosis: Secondary | ICD-10-CM | POA: Diagnosis not present

## 2013-04-23 DIAGNOSIS — L820 Inflamed seborrheic keratosis: Secondary | ICD-10-CM | POA: Diagnosis not present

## 2013-04-25 DIAGNOSIS — J309 Allergic rhinitis, unspecified: Secondary | ICD-10-CM | POA: Diagnosis not present

## 2013-04-29 DIAGNOSIS — J309 Allergic rhinitis, unspecified: Secondary | ICD-10-CM | POA: Diagnosis not present

## 2013-05-02 DIAGNOSIS — I10 Essential (primary) hypertension: Secondary | ICD-10-CM | POA: Diagnosis not present

## 2013-05-02 DIAGNOSIS — E119 Type 2 diabetes mellitus without complications: Secondary | ICD-10-CM | POA: Diagnosis not present

## 2013-05-03 DIAGNOSIS — J309 Allergic rhinitis, unspecified: Secondary | ICD-10-CM | POA: Diagnosis not present

## 2013-05-07 DIAGNOSIS — Z23 Encounter for immunization: Secondary | ICD-10-CM | POA: Diagnosis not present

## 2013-05-07 DIAGNOSIS — I10 Essential (primary) hypertension: Secondary | ICD-10-CM | POA: Diagnosis not present

## 2013-05-07 DIAGNOSIS — J309 Allergic rhinitis, unspecified: Secondary | ICD-10-CM | POA: Diagnosis not present

## 2013-05-07 DIAGNOSIS — E7800 Pure hypercholesterolemia, unspecified: Secondary | ICD-10-CM | POA: Diagnosis not present

## 2013-05-07 DIAGNOSIS — I259 Chronic ischemic heart disease, unspecified: Secondary | ICD-10-CM | POA: Diagnosis not present

## 2013-05-07 DIAGNOSIS — E119 Type 2 diabetes mellitus without complications: Secondary | ICD-10-CM | POA: Diagnosis not present

## 2013-05-10 DIAGNOSIS — J309 Allergic rhinitis, unspecified: Secondary | ICD-10-CM | POA: Diagnosis not present

## 2013-05-15 DIAGNOSIS — J309 Allergic rhinitis, unspecified: Secondary | ICD-10-CM | POA: Diagnosis not present

## 2013-05-30 DIAGNOSIS — J309 Allergic rhinitis, unspecified: Secondary | ICD-10-CM | POA: Diagnosis not present

## 2013-06-06 DIAGNOSIS — J309 Allergic rhinitis, unspecified: Secondary | ICD-10-CM | POA: Diagnosis not present

## 2013-06-25 DIAGNOSIS — J309 Allergic rhinitis, unspecified: Secondary | ICD-10-CM | POA: Diagnosis not present

## 2013-07-03 DIAGNOSIS — M999 Biomechanical lesion, unspecified: Secondary | ICD-10-CM | POA: Diagnosis not present

## 2013-07-03 DIAGNOSIS — M47817 Spondylosis without myelopathy or radiculopathy, lumbosacral region: Secondary | ICD-10-CM | POA: Diagnosis not present

## 2013-07-03 DIAGNOSIS — M5137 Other intervertebral disc degeneration, lumbosacral region: Secondary | ICD-10-CM | POA: Diagnosis not present

## 2013-07-03 DIAGNOSIS — IMO0002 Reserved for concepts with insufficient information to code with codable children: Secondary | ICD-10-CM | POA: Diagnosis not present

## 2013-07-03 DIAGNOSIS — J309 Allergic rhinitis, unspecified: Secondary | ICD-10-CM | POA: Diagnosis not present

## 2013-07-05 DIAGNOSIS — M999 Biomechanical lesion, unspecified: Secondary | ICD-10-CM | POA: Diagnosis not present

## 2013-07-05 DIAGNOSIS — M47817 Spondylosis without myelopathy or radiculopathy, lumbosacral region: Secondary | ICD-10-CM | POA: Diagnosis not present

## 2013-07-05 DIAGNOSIS — M5137 Other intervertebral disc degeneration, lumbosacral region: Secondary | ICD-10-CM | POA: Diagnosis not present

## 2013-07-05 DIAGNOSIS — IMO0002 Reserved for concepts with insufficient information to code with codable children: Secondary | ICD-10-CM | POA: Diagnosis not present

## 2013-07-15 DIAGNOSIS — M999 Biomechanical lesion, unspecified: Secondary | ICD-10-CM | POA: Diagnosis not present

## 2013-07-15 DIAGNOSIS — M5137 Other intervertebral disc degeneration, lumbosacral region: Secondary | ICD-10-CM | POA: Diagnosis not present

## 2013-07-15 DIAGNOSIS — M47817 Spondylosis without myelopathy or radiculopathy, lumbosacral region: Secondary | ICD-10-CM | POA: Diagnosis not present

## 2013-07-15 DIAGNOSIS — IMO0002 Reserved for concepts with insufficient information to code with codable children: Secondary | ICD-10-CM | POA: Diagnosis not present

## 2013-07-17 DIAGNOSIS — J309 Allergic rhinitis, unspecified: Secondary | ICD-10-CM | POA: Diagnosis not present

## 2013-07-17 DIAGNOSIS — M5137 Other intervertebral disc degeneration, lumbosacral region: Secondary | ICD-10-CM | POA: Diagnosis not present

## 2013-07-17 DIAGNOSIS — M999 Biomechanical lesion, unspecified: Secondary | ICD-10-CM | POA: Diagnosis not present

## 2013-07-17 DIAGNOSIS — M47817 Spondylosis without myelopathy or radiculopathy, lumbosacral region: Secondary | ICD-10-CM | POA: Diagnosis not present

## 2013-07-17 DIAGNOSIS — IMO0002 Reserved for concepts with insufficient information to code with codable children: Secondary | ICD-10-CM | POA: Diagnosis not present

## 2013-07-19 DIAGNOSIS — M5137 Other intervertebral disc degeneration, lumbosacral region: Secondary | ICD-10-CM | POA: Diagnosis not present

## 2013-07-19 DIAGNOSIS — M999 Biomechanical lesion, unspecified: Secondary | ICD-10-CM | POA: Diagnosis not present

## 2013-07-19 DIAGNOSIS — M47817 Spondylosis without myelopathy or radiculopathy, lumbosacral region: Secondary | ICD-10-CM | POA: Diagnosis not present

## 2013-07-19 DIAGNOSIS — IMO0002 Reserved for concepts with insufficient information to code with codable children: Secondary | ICD-10-CM | POA: Diagnosis not present

## 2013-07-22 DIAGNOSIS — M47817 Spondylosis without myelopathy or radiculopathy, lumbosacral region: Secondary | ICD-10-CM | POA: Diagnosis not present

## 2013-07-22 DIAGNOSIS — IMO0002 Reserved for concepts with insufficient information to code with codable children: Secondary | ICD-10-CM | POA: Diagnosis not present

## 2013-07-22 DIAGNOSIS — M999 Biomechanical lesion, unspecified: Secondary | ICD-10-CM | POA: Diagnosis not present

## 2013-07-22 DIAGNOSIS — M5137 Other intervertebral disc degeneration, lumbosacral region: Secondary | ICD-10-CM | POA: Diagnosis not present

## 2013-07-24 DIAGNOSIS — J309 Allergic rhinitis, unspecified: Secondary | ICD-10-CM | POA: Diagnosis not present

## 2013-07-24 DIAGNOSIS — M5137 Other intervertebral disc degeneration, lumbosacral region: Secondary | ICD-10-CM | POA: Diagnosis not present

## 2013-07-24 DIAGNOSIS — M47817 Spondylosis without myelopathy or radiculopathy, lumbosacral region: Secondary | ICD-10-CM | POA: Diagnosis not present

## 2013-07-24 DIAGNOSIS — IMO0002 Reserved for concepts with insufficient information to code with codable children: Secondary | ICD-10-CM | POA: Diagnosis not present

## 2013-07-24 DIAGNOSIS — M999 Biomechanical lesion, unspecified: Secondary | ICD-10-CM | POA: Diagnosis not present

## 2013-07-26 DIAGNOSIS — IMO0002 Reserved for concepts with insufficient information to code with codable children: Secondary | ICD-10-CM | POA: Diagnosis not present

## 2013-07-26 DIAGNOSIS — M47817 Spondylosis without myelopathy or radiculopathy, lumbosacral region: Secondary | ICD-10-CM | POA: Diagnosis not present

## 2013-07-26 DIAGNOSIS — M999 Biomechanical lesion, unspecified: Secondary | ICD-10-CM | POA: Diagnosis not present

## 2013-07-26 DIAGNOSIS — M5137 Other intervertebral disc degeneration, lumbosacral region: Secondary | ICD-10-CM | POA: Diagnosis not present

## 2013-07-29 DIAGNOSIS — IMO0002 Reserved for concepts with insufficient information to code with codable children: Secondary | ICD-10-CM | POA: Diagnosis not present

## 2013-07-29 DIAGNOSIS — M47817 Spondylosis without myelopathy or radiculopathy, lumbosacral region: Secondary | ICD-10-CM | POA: Diagnosis not present

## 2013-07-29 DIAGNOSIS — M999 Biomechanical lesion, unspecified: Secondary | ICD-10-CM | POA: Diagnosis not present

## 2013-07-29 DIAGNOSIS — M5137 Other intervertebral disc degeneration, lumbosacral region: Secondary | ICD-10-CM | POA: Diagnosis not present

## 2013-07-31 DIAGNOSIS — M999 Biomechanical lesion, unspecified: Secondary | ICD-10-CM | POA: Diagnosis not present

## 2013-07-31 DIAGNOSIS — M5137 Other intervertebral disc degeneration, lumbosacral region: Secondary | ICD-10-CM | POA: Diagnosis not present

## 2013-07-31 DIAGNOSIS — IMO0002 Reserved for concepts with insufficient information to code with codable children: Secondary | ICD-10-CM | POA: Diagnosis not present

## 2013-07-31 DIAGNOSIS — M47817 Spondylosis without myelopathy or radiculopathy, lumbosacral region: Secondary | ICD-10-CM | POA: Diagnosis not present

## 2013-08-02 DIAGNOSIS — M999 Biomechanical lesion, unspecified: Secondary | ICD-10-CM | POA: Diagnosis not present

## 2013-08-02 DIAGNOSIS — M5137 Other intervertebral disc degeneration, lumbosacral region: Secondary | ICD-10-CM | POA: Diagnosis not present

## 2013-08-02 DIAGNOSIS — IMO0002 Reserved for concepts with insufficient information to code with codable children: Secondary | ICD-10-CM | POA: Diagnosis not present

## 2013-08-02 DIAGNOSIS — M47817 Spondylosis without myelopathy or radiculopathy, lumbosacral region: Secondary | ICD-10-CM | POA: Diagnosis not present

## 2013-08-05 DIAGNOSIS — IMO0002 Reserved for concepts with insufficient information to code with codable children: Secondary | ICD-10-CM | POA: Diagnosis not present

## 2013-08-05 DIAGNOSIS — M999 Biomechanical lesion, unspecified: Secondary | ICD-10-CM | POA: Diagnosis not present

## 2013-08-05 DIAGNOSIS — M5137 Other intervertebral disc degeneration, lumbosacral region: Secondary | ICD-10-CM | POA: Diagnosis not present

## 2013-08-05 DIAGNOSIS — M47817 Spondylosis without myelopathy or radiculopathy, lumbosacral region: Secondary | ICD-10-CM | POA: Diagnosis not present

## 2013-08-08 DIAGNOSIS — M47817 Spondylosis without myelopathy or radiculopathy, lumbosacral region: Secondary | ICD-10-CM | POA: Diagnosis not present

## 2013-08-08 DIAGNOSIS — M5137 Other intervertebral disc degeneration, lumbosacral region: Secondary | ICD-10-CM | POA: Diagnosis not present

## 2013-08-08 DIAGNOSIS — IMO0002 Reserved for concepts with insufficient information to code with codable children: Secondary | ICD-10-CM | POA: Diagnosis not present

## 2013-08-08 DIAGNOSIS — M999 Biomechanical lesion, unspecified: Secondary | ICD-10-CM | POA: Diagnosis not present

## 2013-08-26 DIAGNOSIS — M5137 Other intervertebral disc degeneration, lumbosacral region: Secondary | ICD-10-CM | POA: Diagnosis not present

## 2013-08-26 DIAGNOSIS — M47817 Spondylosis without myelopathy or radiculopathy, lumbosacral region: Secondary | ICD-10-CM | POA: Diagnosis not present

## 2013-08-26 DIAGNOSIS — M999 Biomechanical lesion, unspecified: Secondary | ICD-10-CM | POA: Diagnosis not present

## 2013-08-26 DIAGNOSIS — IMO0002 Reserved for concepts with insufficient information to code with codable children: Secondary | ICD-10-CM | POA: Diagnosis not present

## 2013-09-19 DIAGNOSIS — I10 Essential (primary) hypertension: Secondary | ICD-10-CM | POA: Diagnosis not present

## 2013-09-19 DIAGNOSIS — E7800 Pure hypercholesterolemia, unspecified: Secondary | ICD-10-CM | POA: Diagnosis not present

## 2013-09-19 DIAGNOSIS — G2581 Restless legs syndrome: Secondary | ICD-10-CM | POA: Diagnosis not present

## 2013-09-19 DIAGNOSIS — E119 Type 2 diabetes mellitus without complications: Secondary | ICD-10-CM | POA: Diagnosis not present

## 2013-10-16 ENCOUNTER — Other Ambulatory Visit: Payer: Self-pay | Admitting: Dermatology

## 2013-10-16 DIAGNOSIS — L570 Actinic keratosis: Secondary | ICD-10-CM | POA: Diagnosis not present

## 2013-10-16 DIAGNOSIS — D485 Neoplasm of uncertain behavior of skin: Secondary | ICD-10-CM | POA: Diagnosis not present

## 2013-10-16 DIAGNOSIS — L820 Inflamed seborrheic keratosis: Secondary | ICD-10-CM | POA: Diagnosis not present

## 2013-10-23 DIAGNOSIS — E119 Type 2 diabetes mellitus without complications: Secondary | ICD-10-CM | POA: Diagnosis not present

## 2013-10-23 DIAGNOSIS — Z125 Encounter for screening for malignant neoplasm of prostate: Secondary | ICD-10-CM | POA: Diagnosis not present

## 2013-10-23 DIAGNOSIS — I10 Essential (primary) hypertension: Secondary | ICD-10-CM | POA: Diagnosis not present

## 2013-10-28 DIAGNOSIS — E119 Type 2 diabetes mellitus without complications: Secondary | ICD-10-CM | POA: Diagnosis not present

## 2013-10-28 DIAGNOSIS — I10 Essential (primary) hypertension: Secondary | ICD-10-CM | POA: Diagnosis not present

## 2013-10-28 DIAGNOSIS — I2510 Atherosclerotic heart disease of native coronary artery without angina pectoris: Secondary | ICD-10-CM | POA: Diagnosis not present

## 2013-10-28 DIAGNOSIS — E7800 Pure hypercholesterolemia, unspecified: Secondary | ICD-10-CM | POA: Diagnosis not present

## 2013-12-05 DIAGNOSIS — H2510 Age-related nuclear cataract, unspecified eye: Secondary | ICD-10-CM | POA: Diagnosis not present

## 2013-12-05 DIAGNOSIS — E119 Type 2 diabetes mellitus without complications: Secondary | ICD-10-CM | POA: Diagnosis not present

## 2014-02-13 DIAGNOSIS — M19019 Primary osteoarthritis, unspecified shoulder: Secondary | ICD-10-CM | POA: Diagnosis not present

## 2014-02-13 DIAGNOSIS — M25519 Pain in unspecified shoulder: Secondary | ICD-10-CM | POA: Diagnosis not present

## 2014-02-14 DIAGNOSIS — E119 Type 2 diabetes mellitus without complications: Secondary | ICD-10-CM | POA: Diagnosis not present

## 2014-02-14 DIAGNOSIS — I10 Essential (primary) hypertension: Secondary | ICD-10-CM | POA: Diagnosis not present

## 2014-02-21 DIAGNOSIS — M25569 Pain in unspecified knee: Secondary | ICD-10-CM | POA: Diagnosis not present

## 2014-02-21 DIAGNOSIS — IMO0002 Reserved for concepts with insufficient information to code with codable children: Secondary | ICD-10-CM | POA: Diagnosis not present

## 2014-02-21 DIAGNOSIS — E7800 Pure hypercholesterolemia, unspecified: Secondary | ICD-10-CM | POA: Diagnosis not present

## 2014-02-21 DIAGNOSIS — M1710 Unilateral primary osteoarthritis, unspecified knee: Secondary | ICD-10-CM | POA: Diagnosis not present

## 2014-02-21 DIAGNOSIS — I10 Essential (primary) hypertension: Secondary | ICD-10-CM | POA: Diagnosis not present

## 2014-02-21 DIAGNOSIS — E119 Type 2 diabetes mellitus without complications: Secondary | ICD-10-CM | POA: Diagnosis not present

## 2014-02-28 ENCOUNTER — Encounter: Payer: Self-pay | Admitting: Physician Assistant

## 2014-02-28 ENCOUNTER — Ambulatory Visit (INDEPENDENT_AMBULATORY_CARE_PROVIDER_SITE_OTHER): Payer: Medicare Other | Admitting: Physician Assistant

## 2014-02-28 VITALS — BP 110/68 | HR 57 | Ht 69.0 in | Wt 198.0 lb

## 2014-02-28 DIAGNOSIS — I10 Essential (primary) hypertension: Secondary | ICD-10-CM | POA: Diagnosis not present

## 2014-02-28 DIAGNOSIS — I2510 Atherosclerotic heart disease of native coronary artery without angina pectoris: Secondary | ICD-10-CM

## 2014-02-28 DIAGNOSIS — E7800 Pure hypercholesterolemia, unspecified: Secondary | ICD-10-CM | POA: Diagnosis not present

## 2014-02-28 NOTE — Progress Notes (Signed)
Cardiology Office Note    Date:  02/28/2014   ID:  Zachary Nolan, DOB [birth year], MRN [id number]  PCP:  Jani Gravel, MD  Cardiologist:  Dr. Sherren Mocha      History of Present Illness: Zachary Nolan is a 73 y.o. male with a hx of CAD s/p inf STEMI in 2011 Rx with a DES to the RCA, ICM with EF 35-40% at time of MI >> improved to normal by nuclear study in 2014, DM2, HTN, HL.  He is intol to statins.  He has not been on beta blocker Rx b/c of bradycardia.  Last seen by Dr. Sherren Mocha in 02/2013.  He returns for FU.  The patient denies chest pain, shortness of breath, syncope, orthopnea, PND or significant pedal edema.    Studies:  - LHC (7/11):  Inf STEMI >>> inf AK, EF 35-40%, LAD 70-75%, mid CFX 70%, dist RCA 99% >>> PCI:  3.5 x 28 mm Promus DES to RCA  - Nuclear (3/14):  Small inf defect - likely scar; no ischemia, EF 59%; LOW RISK   Recent Labs/Images: No results found for requested labs within last 365 days.  US Arterial Seg Multiple  11/15/2012     Findings:  R 1.21;  L 1.16 IMPRESSION: 1.  No evidence of vasoocclusive disease within either lower extremity. 2.  Borderline elevated ABI within the right lower extremity is most suggestive of small-vessel calcifications likely attributable to reported history of diabetes.   Original Report Authenticated By: Jake Seats, MD     Wt Readings from Last 3 Encounters:  02/27/13 198 lb (89.812 kg)  08/29/12 190 lb (86.183 kg)  08/23/12 186 lb (84.369 kg)     Past Medical History  Diagnosis Date  . Myocardial infarction     Acute inferior ST-elevation myocardial infarction July 2011 s/p PCI RCA  . Diabetes mellitus     Non-insulin-dependent diabetes mellitus.   Marland Kitchen HTN (hypertension)   . Hyperlipemia   . DJD (degenerative joint disease)   . Seasonal allergies     Current Outpatient Prescriptions  Medication Sig Dispense Refill  . aspirin 81 MG tablet Take 81 mg by mouth daily.        Marland Kitchen azelastine (ASTELIN) 137 MCG/SPRAY nasal  spray Place 1 spray into the nose 2 (two) times daily as needed for rhinitis. Use in each nostril as directed      . Calcium Carbonate-Vitamin D (CALCIUM 600 + D PO) Take 1 tablet by mouth daily.       Marland Kitchen CINNAMON PO Take 1,500 mg by mouth 2 (two) times daily.      . Coenzyme Q10 (CO Q 10 PO) Take 1 capsule by mouth daily. 1 tab daily      . ezetimibe (ZETIA) 10 MG tablet Take 10 mg by mouth daily.      Marland Kitchen glimepiride (AMARYL) 1 MG tablet Take 1 mg by mouth daily before breakfast.      . hydrochlorothiazide (HYDRODIURIL) 25 MG tablet Take 25 mg by mouth daily.      Marland Kitchen lisinopril (PRINIVIL,ZESTRIL) 20 MG tablet Take 20 mg by mouth 2 (two) times daily.      . metFORMIN (GLUCOPHAGE) 500 MG tablet TAKE ONE TABLET IN THE MORNING AND 2 TABLET IN THE AFTERNOON DAILY      . Multiple Vitamin (MULTIVITAMIN) capsule Take 1 capsule by mouth daily.        Marland Kitchen NITROSTAT 0.4 MG SL tablet DISSOLVE 1 TABLET UNDER TONGUE EVERY  5 MINTUES AS NEEDED UP TO 3 DOSES  25 tablet  1  . Omega-3 Fatty Acids (FISH OIL) 1000 MG CAPS Take 1 capsule by mouth 2 (two) times daily.       . Omeprazole Magnesium (PRILOSEC OTC PO) Take by mouth. TAKE 20.6MG  DAILY AS NEEDED       No current facility-administered medications for this visit.     Allergies:   Dust mite extract; Pollen extract; and Shellfish-derived products   Social History:  The patient  reports that he has quit smoking. He does not have any smokeless tobacco history on file. He reports that he does not drink alcohol or use illicit drugs.   Family History:  The patient's family history includes Coronary artery disease in an other family member.   ROS:  Please see the history of present illness.   He continues to have issues with periodic leg weakness.   All other systems reviewed and negative.   PHYSICAL EXAM: VS:  BP 110/68  Pulse 57  Ht 5\' 9"  (1.753 m)  Wt 198 lb (89.812 kg)  BMI 29.23 kg/m2 Well nourished, well developed, in no acute distress HEENT:  normal Neck: no JVD Vascular:  No carotid bruits Cardiac:  normal S1, S2; RRR; no murmur Lungs:  clear to auscultation bilaterally, no wheezing, rhonchi or rales Abd: soft, nontender, no hepatomegaly Ext: no edema Skin: warm and dry Neuro:  CNs 2-12 intact, no focal abnormalities noted  EKG:  Sinus brady, HR 57, normal axis, no ST changes     ASSESSMENT AND PLAN:  1. CORONARY ARTERY DISEASE:  No angina.  Continue ASA and statin. He is not on beta blocker due to bradycardia.  Myoview in 2014 was low risk. 2. HYPERTENSION:  Controlled.  3. HYPERCHOLESTEROLEMIA:  Managed by primary care.  Continue statin.  Disposition:  FU with Dr. Sherren Mocha in 1 year.   Signed, Zachary Nolan, MHS 02/28/2014 8:04 AM    [facility] [address], [place], [place]  [address] Phone: [phone]; Fax: [phone]

## 2014-02-28 NOTE — Patient Instructions (Signed)
It was great to meet you today!  Continue your same medications.  Please schedule follow up with Dr. Sherren Mocha in 1 year.  Return sooner if you need to.

## 2014-03-19 IMAGING — CR DG CHEST 2V
2 series · 2 of 2 positions shown · non-contrast
Comparison: Chest x-ray 01/22/2010.

CLINICAL DATA: Chest pain and left arm pain.

CHEST - 2 VIEW

[w chest pa]
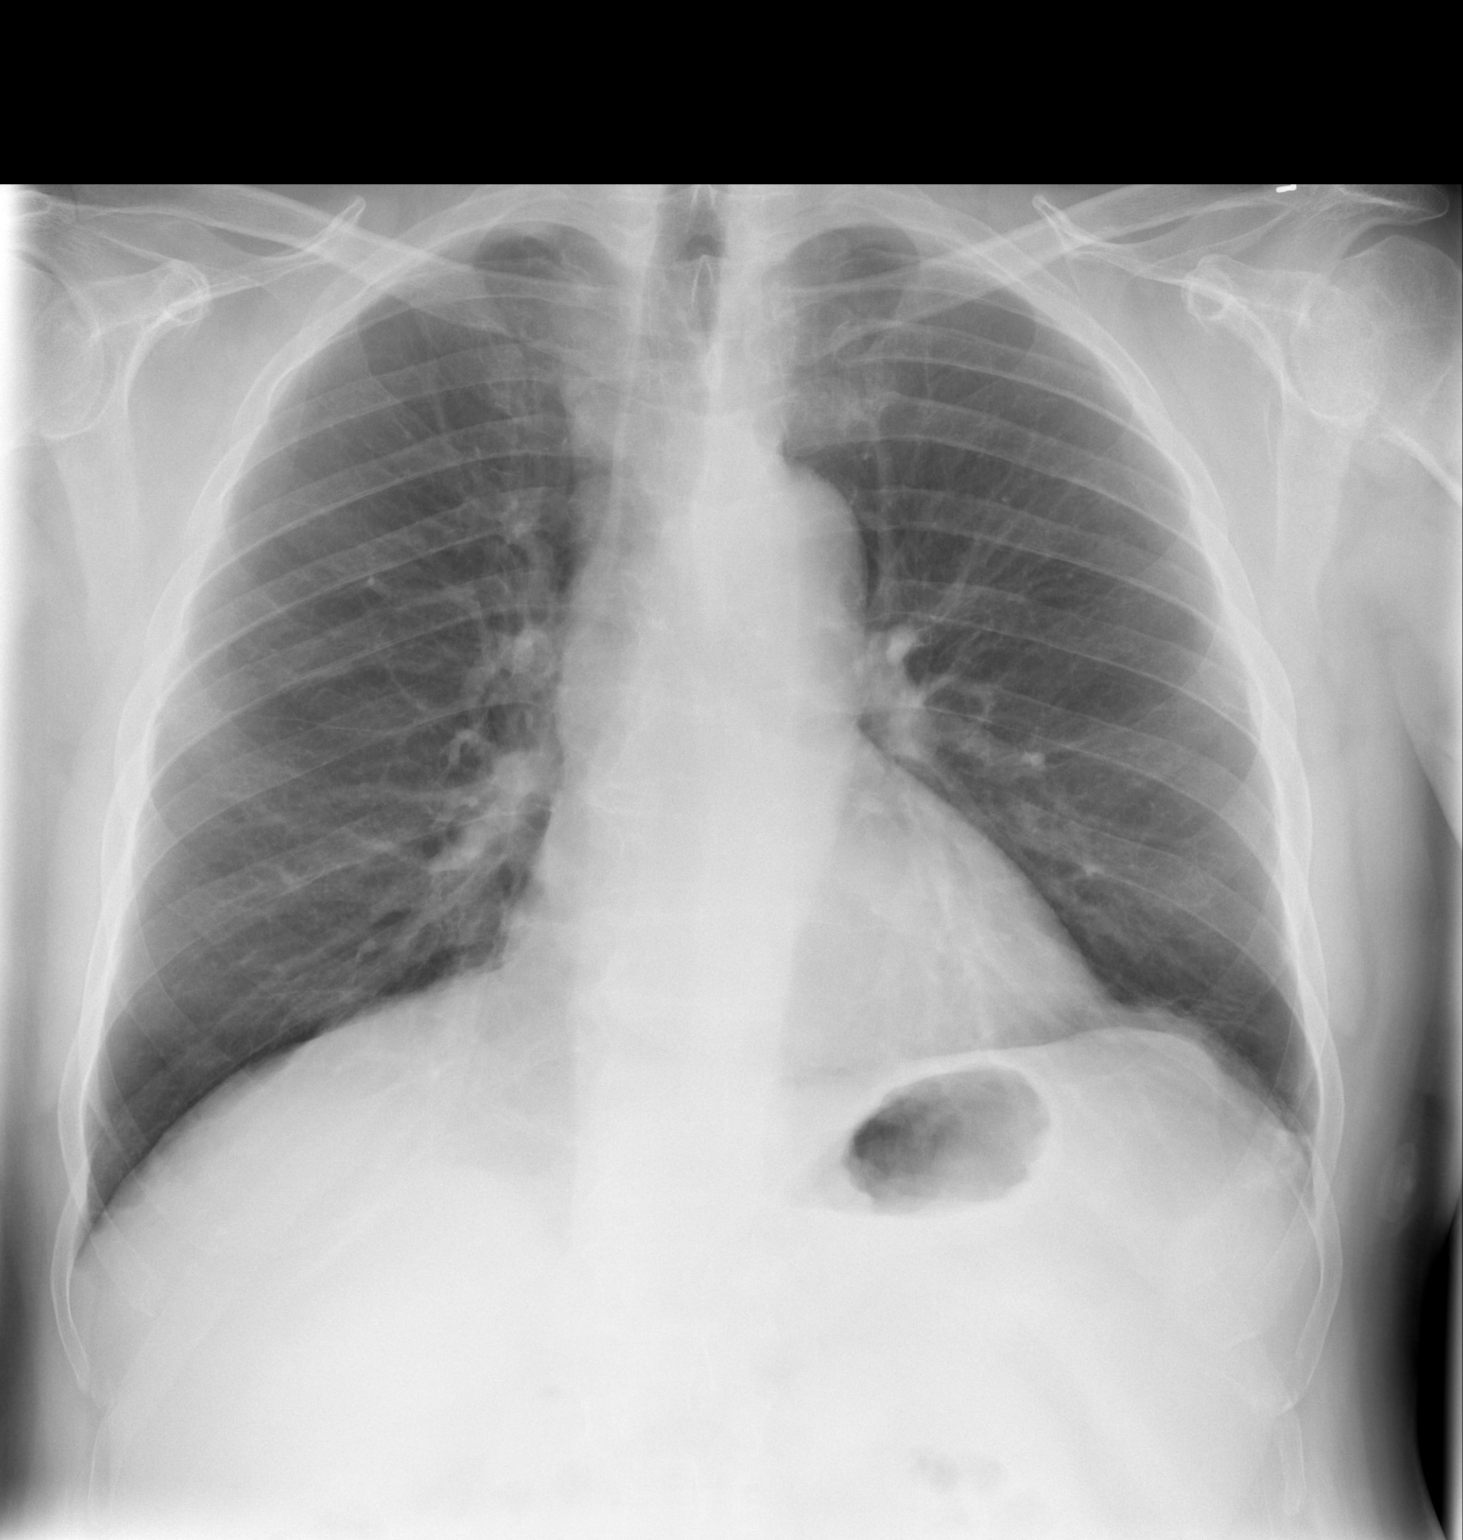

[w chest lat]
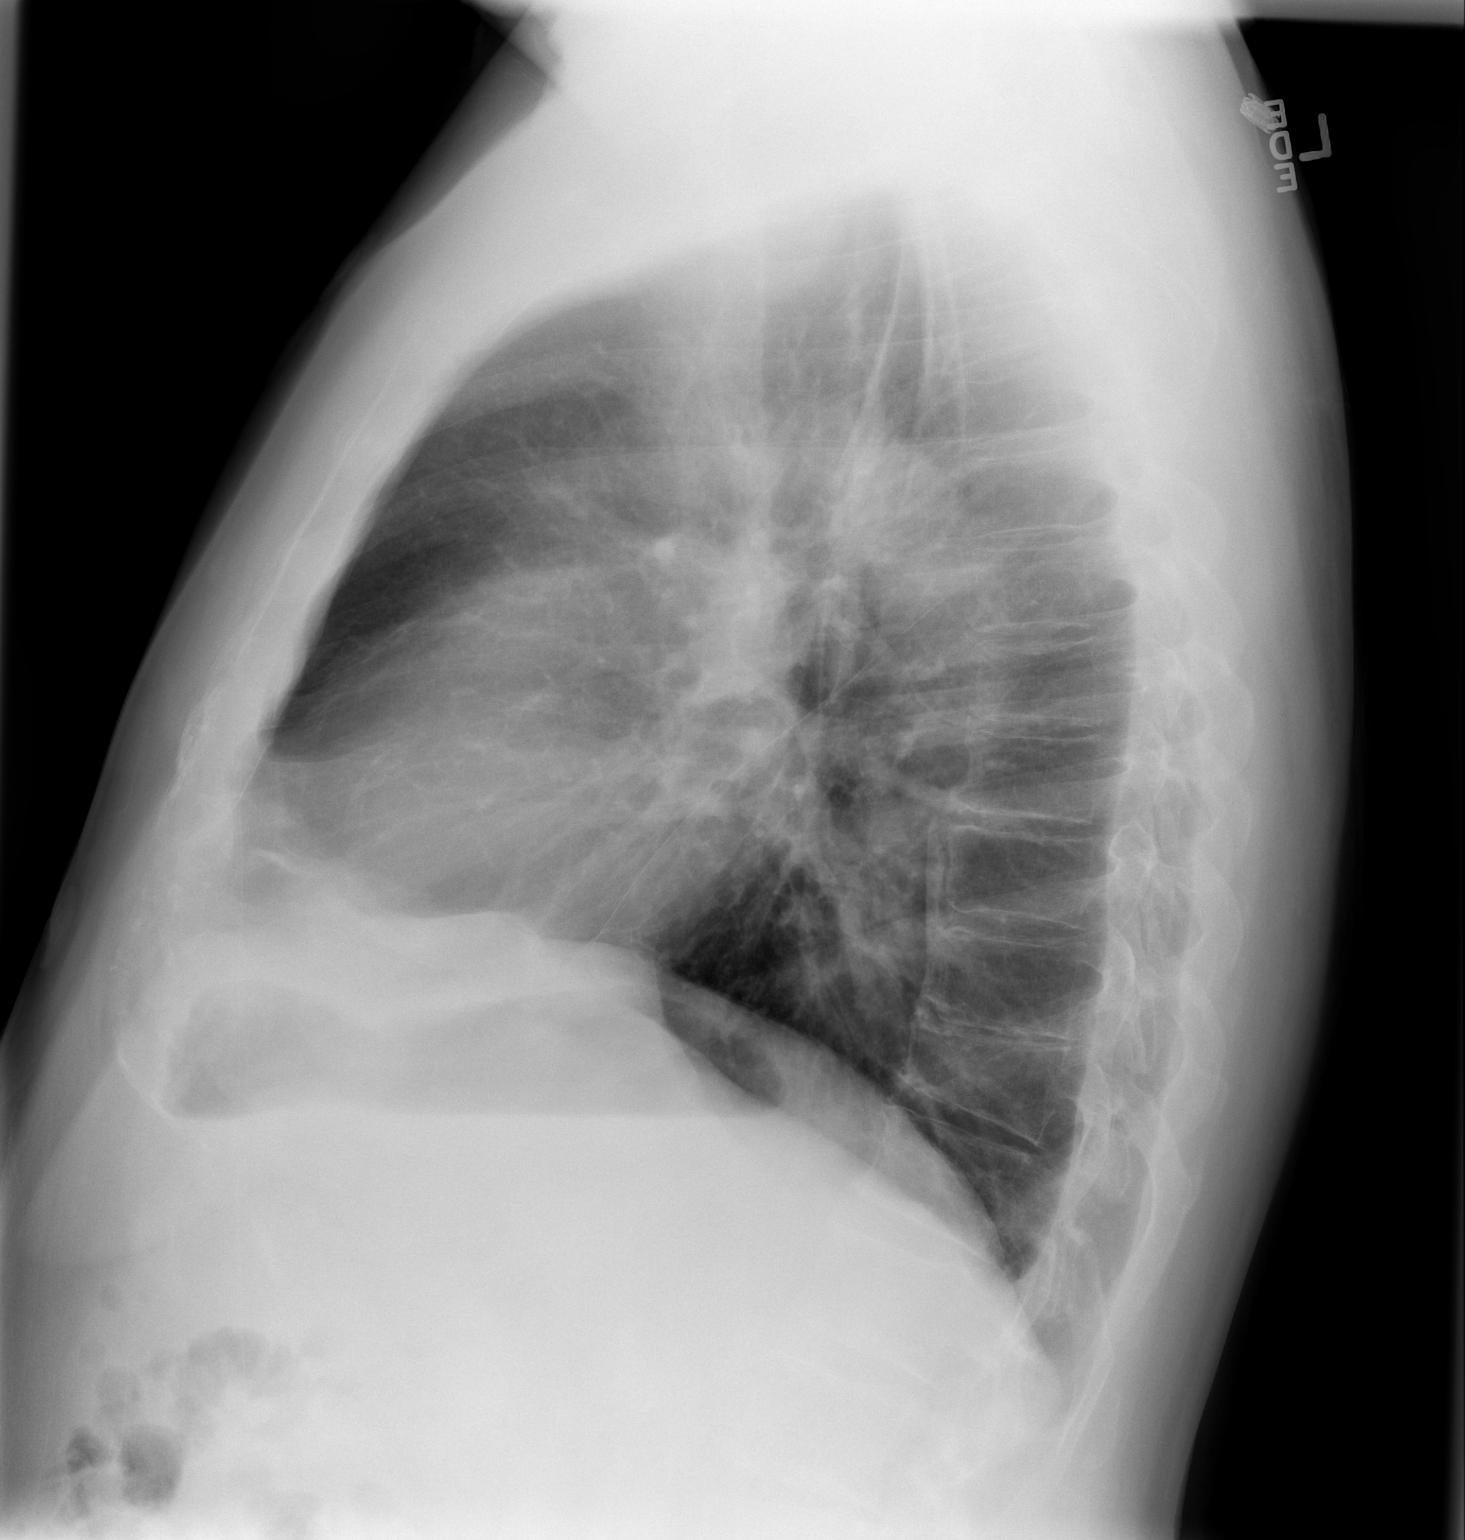

[2 of 2 positions shown; findings below may reference images not displayed]

FINDINGS: Lung volumes are normal.  Linear opacities in the
periphery of the left base are most compatible with subsegmental
atelectasis and/or scarring.  No acute consolidative airspace
disease.  No pleural effusions.  Pulmonary vasculature and the
cardiomediastinal silhouette are within normal limits.
IMPRESSION: 1.  No radiographic evidence of acute cardiopulmonary disease.

## 2014-03-20 DIAGNOSIS — M47817 Spondylosis without myelopathy or radiculopathy, lumbosacral region: Secondary | ICD-10-CM | POA: Diagnosis not present

## 2014-03-20 DIAGNOSIS — E1142 Type 2 diabetes mellitus with diabetic polyneuropathy: Secondary | ICD-10-CM | POA: Diagnosis not present

## 2014-03-20 DIAGNOSIS — E1149 Type 2 diabetes mellitus with other diabetic neurological complication: Secondary | ICD-10-CM | POA: Diagnosis not present

## 2014-03-20 DIAGNOSIS — I70209 Unspecified atherosclerosis of native arteries of extremities, unspecified extremity: Secondary | ICD-10-CM | POA: Diagnosis not present

## 2014-04-01 DIAGNOSIS — M47816 Spondylosis without myelopathy or radiculopathy, lumbar region: Secondary | ICD-10-CM | POA: Diagnosis not present

## 2014-04-08 DIAGNOSIS — M47816 Spondylosis without myelopathy or radiculopathy, lumbar region: Secondary | ICD-10-CM | POA: Diagnosis not present

## 2014-04-10 DIAGNOSIS — M47816 Spondylosis without myelopathy or radiculopathy, lumbar region: Secondary | ICD-10-CM | POA: Diagnosis not present

## 2014-04-15 DIAGNOSIS — M47816 Spondylosis without myelopathy or radiculopathy, lumbar region: Secondary | ICD-10-CM | POA: Diagnosis not present

## 2014-04-17 DIAGNOSIS — E1142 Type 2 diabetes mellitus with diabetic polyneuropathy: Secondary | ICD-10-CM | POA: Diagnosis not present

## 2014-04-17 DIAGNOSIS — M47816 Spondylosis without myelopathy or radiculopathy, lumbar region: Secondary | ICD-10-CM | POA: Diagnosis not present

## 2014-04-22 ENCOUNTER — Ambulatory Visit (HOSPITAL_COMMUNITY)
Admission: RE | Admit: 2014-04-22 | Discharge: 2014-04-22 | Disposition: A | Discharge status: Home / Self Care | Payer: Medicare Other | Source: Ambulatory Visit | Attending: Physical Medicine and Rehabilitation | Admitting: Physical Medicine and Rehabilitation

## 2014-04-22 ENCOUNTER — Other Ambulatory Visit (HOSPITAL_COMMUNITY): Payer: Self-pay | Admitting: Physical Medicine and Rehabilitation

## 2014-04-22 DIAGNOSIS — M795 Residual foreign body in soft tissue: Secondary | ICD-10-CM | POA: Diagnosis present

## 2014-04-22 DIAGNOSIS — Z1389 Encounter for screening for other disorder: Secondary | ICD-10-CM | POA: Insufficient documentation

## 2014-04-22 DIAGNOSIS — R29898 Other symptoms and signs involving the musculoskeletal system: Secondary | ICD-10-CM

## 2014-04-22 DIAGNOSIS — Z01818 Encounter for other preprocedural examination: Secondary | ICD-10-CM | POA: Diagnosis not present

## 2014-04-26 DIAGNOSIS — M545 Low back pain: Secondary | ICD-10-CM | POA: Diagnosis not present

## 2014-05-07 DIAGNOSIS — E1142 Type 2 diabetes mellitus with diabetic polyneuropathy: Secondary | ICD-10-CM | POA: Diagnosis not present

## 2014-05-07 DIAGNOSIS — M47816 Spondylosis without myelopathy or radiculopathy, lumbar region: Secondary | ICD-10-CM | POA: Diagnosis not present

## 2014-05-07 DIAGNOSIS — M4806 Spinal stenosis, lumbar region: Secondary | ICD-10-CM | POA: Diagnosis not present

## 2014-05-20 DIAGNOSIS — M4806 Spinal stenosis, lumbar region: Secondary | ICD-10-CM | POA: Diagnosis not present

## 2014-05-20 DIAGNOSIS — M1612 Unilateral primary osteoarthritis, left hip: Secondary | ICD-10-CM | POA: Diagnosis not present

## 2014-05-20 DIAGNOSIS — M47816 Spondylosis without myelopathy or radiculopathy, lumbar region: Secondary | ICD-10-CM | POA: Diagnosis not present

## 2014-06-05 ENCOUNTER — Encounter: Payer: Self-pay | Admitting: Cardiovascular Disease

## 2014-06-05 DIAGNOSIS — I10 Essential (primary) hypertension: Secondary | ICD-10-CM | POA: Diagnosis not present

## 2014-06-05 DIAGNOSIS — E139 Other specified diabetes mellitus without complications: Secondary | ICD-10-CM | POA: Diagnosis not present

## 2014-06-11 DIAGNOSIS — E118 Type 2 diabetes mellitus with unspecified complications: Secondary | ICD-10-CM | POA: Diagnosis not present

## 2014-06-11 DIAGNOSIS — I2510 Atherosclerotic heart disease of native coronary artery without angina pectoris: Secondary | ICD-10-CM | POA: Diagnosis not present

## 2014-06-11 DIAGNOSIS — I10 Essential (primary) hypertension: Secondary | ICD-10-CM | POA: Diagnosis not present

## 2014-06-11 DIAGNOSIS — E780 Pure hypercholesterolemia: Secondary | ICD-10-CM | POA: Diagnosis not present

## 2014-07-16 ENCOUNTER — Telehealth: Payer: Self-pay | Admitting: *Deleted

## 2014-07-16 ENCOUNTER — Ambulatory Visit (INDEPENDENT_AMBULATORY_CARE_PROVIDER_SITE_OTHER): Payer: Medicare Other | Admitting: Physician Assistant

## 2014-07-16 ENCOUNTER — Telehealth: Payer: Self-pay | Admitting: Cardiovascular Disease

## 2014-07-16 ENCOUNTER — Encounter: Payer: Self-pay | Admitting: Physician Assistant

## 2014-07-16 VITALS — BP 130/70 | HR 57 | Ht 69.0 in | Wt 202.0 lb

## 2014-07-16 DIAGNOSIS — E119 Type 2 diabetes mellitus without complications: Secondary | ICD-10-CM

## 2014-07-16 DIAGNOSIS — I10 Essential (primary) hypertension: Secondary | ICD-10-CM

## 2014-07-16 DIAGNOSIS — R079 Chest pain, unspecified: Secondary | ICD-10-CM

## 2014-07-16 DIAGNOSIS — I255 Ischemic cardiomyopathy: Secondary | ICD-10-CM | POA: Diagnosis not present

## 2014-07-16 DIAGNOSIS — I2510 Atherosclerotic heart disease of native coronary artery without angina pectoris: Secondary | ICD-10-CM

## 2014-07-16 DIAGNOSIS — E785 Hyperlipidemia, unspecified: Secondary | ICD-10-CM

## 2014-07-16 LAB — BASIC METABOLIC PANEL
BUN: 23 mg/dL (ref 6–23)
CO2: 29 mEq/L (ref 19–32)
Calcium: 10.3 mg/dL (ref 8.4–10.5)
Chloride: 103 mEq/L (ref 96–112)
Creatinine, Ser: 1.12 mg/dL (ref 0.40–1.50)
GFR: 67.96 mL/min (ref >60.00)
GLUCOSE: 126 mg/dL — AB (ref 70–99)
POTASSIUM: 4.1 meq/L (ref 3.5–5.1)
Sodium: 136 mEq/L (ref 135–145)

## 2014-07-16 LAB — CBC WITH DIFFERENTIAL/PLATELET
BASOS PCT: 0.5 % (ref 0.0–3.0)
Basophils Absolute: 0 10*3/uL (ref 0.0–0.1)
Eosinophils Absolute: 0.1 10*3/uL (ref 0.0–0.7)
Eosinophils Relative: 1 % (ref 0.0–5.0)
HCT: 39.4 % (ref 39.0–52.0)
Hemoglobin: 13.8 g/dL (ref 13.0–17.0)
LYMPHS PCT: 27.1 % (ref 12.0–46.0)
Lymphs Abs: 1.5 10*3/uL (ref 0.7–4.0)
MCHC: 35.1 g/dL (ref 30.0–36.0)
MCV: 90.9 fl (ref 78.0–100.0)
MONO ABS: 0.5 10*3/uL (ref 0.1–1.0)
MONOS PCT: 9 % (ref 3.0–12.0)
NEUTROS ABS: 3.6 10*3/uL (ref 1.4–7.7)
NEUTROS PCT: 62.4 % (ref 43.0–77.0)
Platelets: 181 10*3/uL (ref 150.0–400.0)
RBC: 4.33 Mil/uL (ref 4.22–5.81)
RDW: 13 % (ref 11.5–15.5)
WBC: 5.7 10*3/uL (ref 4.0–10.5)

## 2014-07-16 LAB — TROPONIN I: TNIDX: 0 ug/l (ref 0.00–0.06)

## 2014-07-16 NOTE — Telephone Encounter (Signed)
I spoke with the pt and he complains of discomfort in his chest and frequent belching. The pt started Atorvastatin 1/5 per Dr Maudie Mercury and he wondered if this medication contributed to worsening symptoms.  The pt also started going to the gym prior to the [place] and he has noticed that he develops chest discomfort with exertion and then belches and his symptoms decrease in severity. The pt states that his symptoms during MI were not typical and he cannot tell if these symptoms are heart related.  BP today 142/82, pulse 60.  The pt has also noticed that his pulse has been erratic at times. I have scheduled the pt to come into the office today for further evaluation.

## 2014-07-16 NOTE — Patient Instructions (Addendum)
Your physician has requested that you have a lexiscan myoview. PER SCOTT WEAVER, PAC THIS NEEDS TO BE DONE IN THE NEXT 7 DAYS. For further information please visit [email or web address]. Please follow instruction sheet, as given.  STAT TROPONIN I TODAY; ALSO BMET, CBC W/DIFF  INCREASE PRILOSEC TO 40 MG DAILY FOR 2 WEEKS THEN RESUME 20 MG DAILY  HOLD LIPITOR UNTIL FURTHER NOTIFIED BY CARDIOLOGY  Your physician recommends that you schedule a follow-up appointment in: 2-3 [place], [place] IS IN THE OFFICE

## 2014-07-16 NOTE — Telephone Encounter (Signed)
Pt notified about negative troponin today with verbal understanding

## 2014-07-16 NOTE — Progress Notes (Signed)
Cardiology Office Note   Date:  07/16/2014   ID:  Zachary Nolan, DOB 1941/01/10, MRN [id number]  Patient Care Team: Jani Gravel, MD as PCP - General (Internal Medicine) Blane Ohara, MD as Consulting Physician (Cardiology)    Chief Complaint  Patient presents with  . Chest Pain    History of Present Illness: Zachary Nolan is a 74 y.o. male who presents for evaluation of the above.  He has a hx of CAD s/p inf STEMI in 2011 Rx with a DES to the RCA, ICM with EF 35-40% at time of MI >> improved to normal by nuclear study in 2014, DM2, HTN, HL. He is intol to statins. He has not been on beta blocker Rx b/c of bradycardia.I saw him for FU in 02/2014.  He called in today with complaints of exertional chest pain and was added on to my schedule.    The patient tells me that he has had a lot of chest pains in the past as well as left shoulder pain related to arthritis. He says that he essentially has arthritis "all over." Since last week he has noted substernal chest discomfort that he describes as a dull ache. It typically occurs when he becomes more active. He's been going to the gym and working out on an exercise bike. He has noted this comfort while exercising. However, he denies worsening symptoms with activity. He does have some left shoulder pain. However, he has significant arthritis in this extremity. He denies any jaw pain. His symptoms do not remind him of his previous MI. He denies significant dyspnea. He is NYHA 2.  He denies orthopnea, PND or edema. He denies syncope.  Of note, he took nitroglycerin times one without relief. He has taken an antacid with some relief. He notes increased belching ever since he started back on statin therapy a couple of weeks ago (atorvastatin).  Studies Reviewed Today: None   Past Medical History  Diagnosis Date  . CAD (coronary artery disease)     a. LHC (7/11): Inf STEMI >>> inf AK, EF 35-40%, LAD 70-75%, mid CFX 70%, dist RCA 99% >>> PCI: 3.5 x  28 mm Promus DES to RCA;  b. Nuclear (3/14): Small inf defect - likely scar; no ischemia, EF 59%; LOW RISK   . Diabetes mellitus     Non-insulin-dependent diabetes mellitus.   Marland Kitchen HTN (hypertension)   . Hyperlipemia   . DJD (degenerative joint disease)   . Seasonal allergies   . Ischemic cardiomyopathy     EF 35-40% at time of MI in 2011 >> improved to normal on Nuclear study in 2014    Past Surgical History  Procedure Laterality Date  . Release of left transcarpal ligament.    . Release of right transcarpal ligament.    . Percutaneous coronary intervention using a drug-eluting stent (promus)      Moderately    severe left anterior descending stenosis.  Moderate left     circumflex stenosis, moderate left ventricular dysfunction with   left  ventricular ejection fraction of 35% to 40%.      Current Outpatient Prescriptions  Medication Sig Dispense Refill  . ALPRAZolam (XANAX) 0.5 MG tablet Take 0.5 mg by mouth at bedtime.     Marland Kitchen aspirin 81 MG tablet Take 81 mg by mouth daily.      Marland Kitchen azelastine (ASTELIN) 137 MCG/SPRAY nasal spray Place 1 spray into the nose 2 (two) times daily as needed for rhinitis. Use in each  nostril as directed    . Calcium Carbonate-Vitamin D (CALCIUM 600 + D PO) Take 1 tablet by mouth daily.     Marland Kitchen CINNAMON PO Take 1,500 mg by mouth 2 (two) times daily.    . Coenzyme Q10 (CO Q 10 PO) Take 1 capsule by mouth daily. 1 tab daily    . ezetimibe (ZETIA) 10 MG tablet Take 10 mg by mouth daily.    Marland Kitchen glimepiride (AMARYL) 1 MG tablet Take 1 mg by mouth daily before breakfast.    . hydrochlorothiazide (HYDRODIURIL) 25 MG tablet Take 25 mg by mouth daily.    Marland Kitchen lisinopril (PRINIVIL,ZESTRIL) 20 MG tablet Take 20 mg by mouth 2 (two) times daily.    . metFORMIN (GLUCOPHAGE) 500 MG tablet TAKE ONE TABLET IN THE MORNING AND 2 TABLET IN THE AFTERNOON DAILY    . Multiple Vitamin (MULTIVITAMIN) capsule Take 1 capsule by mouth daily.      Marland Kitchen NITROSTAT 0.4 MG SL tablet DISSOLVE 1 TABLET  UNDER TONGUE EVERY 5 MINTUES AS NEEDED UP TO 3 DOSES (Patient taking differently: DISSOLVE 1 TABLET UNDER TONGUE EVERY 5 MINTUES AS NEEDED UP TO 3 DOSES FOR CHEST PAIN) 25 tablet 1  . Omega-3 Fatty Acids (FISH OIL) 1000 MG CAPS Take 1 capsule by mouth 2 (two) times daily.     Marland Kitchen omeprazole (PRILOSEC) 20 MG capsule Take 20 mg by mouth daily.     . pioglitazone (ACTOS) 15 MG tablet Take 15 mg by mouth daily.      No current facility-administered medications for this visit.    Allergies:   Dust mite extract; Pollen extract; and Shellfish-derived products    Social History:  The patient  reports that he has quit smoking. He does not have any smokeless tobacco history on file. He reports that he does not drink alcohol or use illicit drugs.   Family History:  The patient's family history includes Coronary artery disease in an other family member; Heart attack in his brother and father; Hypertension in his brother, father, and mother. There is no history of Stroke.    ROS:  Please see the history of present illness.   Otherwise, review of systems are positive a lot of back pain and muscle pain that he relates to arthritis. He notes some difficulty with balance but denies falls..   All other systems are reviewed and negative.    PHYSICAL EXAM: VS:  BP 130/70 mmHg  Pulse 57  Ht 5\' 9"  (1.753 m)  Wt 202 lb (91.627 kg)  BMI 29.82 kg/m2  GEN: Well nourished, well developed, in no acute distress HEENT: normal Neck: no JVD, no carotid bruits, or masses Cardiac:  RRR; no murmurs, rubs, or gallops,no edema  Respiratory:  clear to auscultation bilaterally, no wheezing, rhonchi or rales. GI: soft, nontender, nondistended, + BS MS: no deformity or atrophy Skin: warm and dry  Neuro:  Strength and sensation are intact Psych: Normal affect  Wt Readings from Last 3 Encounters:  07/16/14 202 lb (91.627 kg)  02/28/14 198 lb (89.812 kg)  02/27/13 198 lb (89.812 kg)      EKG:  EKG is ordered today.  It  demonstrates:    Sinus bradycardia, HR 57, inferior Q waves, nonspecific ST-T wave changes, no change from prior tracing dated 02/27/13    Recent Labs: No results found for requested labs within last 365 days.  05/02/2013:  ALT 16, creatinine 1.04, potassium 4.2, TC 100 9D, TG 163, HDL 41, LDL 116  ASSESSMENT AND PLAN:  1.  Chest Pain: He has a typical >typical symptoms. Symptoms seemed to have started after starting on Lipitor. He has a lot of belching associated. He's had symptoms today for several hours. This is not unusual since his symptoms started. ECG demonstrates no significant changes.    -  Check stat troponin, BMET, CBC.    -  If troponin abnormal, sent to the emergency room. Otherwise obtain outpatient Lexiscan Myoview.    -  Hold Lipitor for now.    -  Increase Prilosec to 40 mg daily for 2 weeks. He will then resume 20 mg daily. 2.  Coronary Artery Disease:  Arrange Myoview as outlined above. Continue aspirin. Hold statin as outlined above. He is not a candidate for beta blocker given bradycardia. I'm not convinced that his antianginals need to be advanced at this point in time. Consider adding isosorbide if his stress test is low risk. 3.  Ischemic Cardiomyopathy:  Ejection fraction recovered in the past. He cannot take beta blockers secondary to bradycardia. He remains on ACE inhibitor therapy. 4.  Hypertension:  Controlled. He is tolerating lisinopril and HCTZ. Obtain BMET as outlined above. 5.  Hyperlipidemia:  Intolerant to statins.  As noted, he started having increasing belching symptoms when he started on Lipitor. This will be held as outlined above. 6.  Diabetes Mellitus:  Controlled. He tells me his sugars at home range in the 120s.   Current medicines are reviewed at length with the patient today.  The patient has concerns regarding medicines.  The following changes have been made:  As outlined above.  Labs/ tests ordered today include:  Orders Placed This Encounter    Procedures  . Basic Metabolic Panel (BMET)  . CBC w/Diff  . Troponin I  . Myocardial Perfusion Imaging  . EKG 12-Lead     Disposition:   FU with Dr. Burt Knack or me in 2 weeks   Signed, Versie Starks, MHS 07/16/2014 10:34 AM    [facility] [address], [place], [place]  [address] Phone: [phone]; Fax: [phone]

## 2014-07-16 NOTE — Telephone Encounter (Signed)
Pt c/o of Chest Pain: 1. Are you having CP right now? yes 2. Are you experiencing any other symptoms (ex. SOB, nausea, vomiting, sweating)? no 3. How long have you been experiencing CP? 3days 4. Is your CP continuous or coming and going? continuous 5. Have you taken Nitroglycerin? no

## 2014-07-17 ENCOUNTER — Telehealth: Payer: Self-pay

## 2014-07-17 NOTE — Telephone Encounter (Signed)
pt wife advised no dpr on file, to fill out dpr at next appt. pts wife notified of lab results today with verbal understanding as well to the dpr.

## 2014-07-22 ENCOUNTER — Ambulatory Visit (HOSPITAL_COMMUNITY): Payer: Medicare Other | Attending: Cardiology | Admitting: Radiology

## 2014-07-22 DIAGNOSIS — I10 Essential (primary) hypertension: Secondary | ICD-10-CM | POA: Diagnosis not present

## 2014-07-22 DIAGNOSIS — R0609 Other forms of dyspnea: Secondary | ICD-10-CM | POA: Insufficient documentation

## 2014-07-22 DIAGNOSIS — E119 Type 2 diabetes mellitus without complications: Secondary | ICD-10-CM | POA: Insufficient documentation

## 2014-07-22 DIAGNOSIS — I2510 Atherosclerotic heart disease of native coronary artery without angina pectoris: Secondary | ICD-10-CM

## 2014-07-22 DIAGNOSIS — R002 Palpitations: Secondary | ICD-10-CM | POA: Diagnosis not present

## 2014-07-22 DIAGNOSIS — R079 Chest pain, unspecified: Secondary | ICD-10-CM

## 2014-07-22 MED ORDER — TECHNETIUM TC 99M SESTAMIBI GENERIC - CARDIOLITE
33.0000 | Freq: Once | INTRAVENOUS | Status: AC | PRN
  Administered 2014-07-22: 33 via INTRAVENOUS

## 2014-07-22 MED ORDER — REGADENOSON 0.4 MG/5ML IV SOLN
0.4000 mg | Freq: Once | INTRAVENOUS | Status: AC
  Administered 2014-07-22: 0.4 mg via INTRAVENOUS

## 2014-07-22 MED ORDER — TECHNETIUM TC 99M SESTAMIBI GENERIC - CARDIOLITE
11.0000 | Freq: Once | INTRAVENOUS | Status: AC | PRN
  Administered 2014-07-22: 11 via INTRAVENOUS

## 2014-07-22 NOTE — Progress Notes (Signed)
  [facility] [address] [place], [place] [address] [phone]    Cardiology Nuclear Med Study  Zachary Nolan is a 74 y.o. male     MRN : [id number]     DOB: 03-25-1941  Procedure Date: 07/22/2014  Nuclear Med Background Indication for Stress Test:  Evaluation for Ischemia History:  CAD, MPI 2014 (normal) EF 59% Cardiac Risk Factors: Hypertension and NIDDM  Symptoms:  Chest Pain, Chest Pain with Exertion (last date of chest discomfort was one week ago), DOE and Palpitations   Nuclear Pre-Procedure Caffeine/Decaff Intake:  None NPO After: 7:00pm   Lungs:  clear O2 Sat: 98% on room air. IV 0.9% NS with Angio Cath:  22g  IV Site: R Hand  IV Started by:  Matilde Haymaker, RN  Chest Size (in):  42 Cup Size: n/a  Height: 5\' 9"  (1.753 m)  Weight:  199 lb (90.266 kg)  BMI:  Body mass index is 29.37 kg/(m^2). Tech Comments:  n/a    Nuclear Med Study 1 or 2 day study: 1 day  Stress Test Type:  Lexiscan  Reading MD: n/a  Order Authorizing Provider:  Christena Deem and Nicki Reaper [facility]  Resting Radionuclide: Technetium 44m Sestamibi  Resting Radionuclide Dose: 11.0 mCi   Stress Radionuclide:  Technetium 64m Sestamibi  Stress Radionuclide Dose: 33.0 mCi           Stress Protocol Rest HR: 53 Stress HR: 86  Rest BP: 142/76 Stress BP: 116/69  Exercise Time (min): n/a METS: n/a   Predicted Max HR: 146 bpm % Max HR: 58.9 bpm Rate Pressure Product: 11438   Dose of Adenosine (mg):  n/a Dose of Lexiscan: 0.4 mg  Dose of Atropine (mg): n/a Dose of Dobutamine: n/a mcg/kg/min (at max HR)  Stress Test Technologist: Glade Lloyd, BS-ES  Nuclear Technologist:  Earl Many, CNMT     Rest Procedure:  Myocardial perfusion imaging was performed at rest 45 minutes following the intravenous administration of Technetium 64m Sestamibi. Rest ECG: Sinus bradycardia with PVCs.  Stress Procedure:  The patient received IV Lexiscan 0.4 mg over 15-seconds.   Technetium 66m Sestamibi injected at 30-seconds.  Quantitative spect images were obtained after a 45 minute delay.  During the infusion of Lexiscan the patient complained of SOB, lightheadedness and headache.  These symptoms began to resolve in recovery.  Stress ECG: No significant ST segment change suggestive of ischemia.  QPS Raw Data Images:  Acquisition technically good; normal left ventricular size. Stress Images:  There is decreased uptake in the inferior wall. Rest Images:  There is decreased uptake in the inferior wall. Subtraction (SDS):  No evidence of ischemia. Transient Ischemic Dilatation (Normal <1.22):  0.95 Lung/Heart Ratio (Normal <0.45):  0.29  Quantitative Gated Spect Images QGS EDV:  108 ml QGS ESV:  46 ml  Impression Exercise Capacity:  Lexiscan with no exercise. BP Response:  Normal blood pressure response. Clinical Symptoms:  There is dyspnea. ECG Impression:  No significant ST segment change suggestive of ischemia. Comparison with Prior Nuclear Study: Compared to 08/29/12, no change  Overall Impression:  Low risk stress nuclear study with a small, moderate intensity, fixed inferior defect consistent with prior infarct vs diaphragmatic attenuation; no ischemia.  LV Ejection Fraction: 57%.  LV Wall Motion:  NL LV Function; NL Wall Motion  Zachary Nolan

## 2014-07-23 ENCOUNTER — Encounter: Payer: Self-pay | Admitting: Physician Assistant

## 2014-07-24 ENCOUNTER — Telehealth: Payer: Self-pay | Admitting: *Deleted

## 2014-07-24 NOTE — Telephone Encounter (Signed)
Pt notified of myoview results with verbal understanding.

## 2014-08-13 ENCOUNTER — Ambulatory Visit (INDEPENDENT_AMBULATORY_CARE_PROVIDER_SITE_OTHER): Payer: Medicare Other | Admitting: Physician Assistant

## 2014-08-13 ENCOUNTER — Encounter: Payer: Self-pay | Admitting: Physician Assistant

## 2014-08-13 VITALS — BP 135/72 | HR 63 | Ht 69.0 in | Wt 204.0 lb

## 2014-08-13 DIAGNOSIS — G4733 Obstructive sleep apnea (adult) (pediatric): Secondary | ICD-10-CM | POA: Diagnosis not present

## 2014-08-13 DIAGNOSIS — I2510 Atherosclerotic heart disease of native coronary artery without angina pectoris: Secondary | ICD-10-CM | POA: Diagnosis not present

## 2014-08-13 DIAGNOSIS — I255 Ischemic cardiomyopathy: Secondary | ICD-10-CM | POA: Diagnosis not present

## 2014-08-13 DIAGNOSIS — E785 Hyperlipidemia, unspecified: Secondary | ICD-10-CM | POA: Diagnosis not present

## 2014-08-13 DIAGNOSIS — I10 Essential (primary) hypertension: Secondary | ICD-10-CM | POA: Diagnosis not present

## 2014-08-13 NOTE — Patient Instructions (Addendum)
Please start taking that cholesterol medication Dr. Maudie Mercury gave you (Lipitor? Atorvastatin?) on Monday, Wednesday and Friday only.   You have been referred to DR. TURNER FOR OSA  FOLLOW UP WITH DR. Burt Knack IN 02/2015

## 2014-08-13 NOTE — Progress Notes (Signed)
Cardiology Office Note   Date:  08/13/2014   ID:  Zachary Nolan, DOB [birth year], MRN [id number]  Patient Care Team: Jani Gravel, MD as PCP - General (Internal Medicine) Blane Ohara, MD as Consulting Physician (Cardiology)    Chief Complaint  Patient presents with  . Chest Pain    follow up  . Coronary Artery Disease     History of Present Illness: Zachary Nolan is a 74 y.o. male with a hx of CAD s/p inf STEMI in 2011 Rx with a DES to the RCA, ICM with EF 35-40% at time of MI >> improved to normal by nuclear study in 2014, DM2, HTN, HL. He is intol to statins. He has not been on beta blocker Rx b/c of bradycardia.I saw him last month with complaints of exertional chest pain.  Troponin was normal. Lexiscan Myoview returned low risk, negative for ischemia. I did increase his PPI for 2 weeks. I places Lipitor on hold.   Since last seen, he has been doing well. His belching and indigestion has resolved since stopping the Lipitor. He continues to have occasional chest discomfort. This seems to occur with positional changes. He denies any exertional symptoms reminiscent of previous angina. He denies significant dyspnea. He denies orthopnea, PND or edema. He denies syncope.  Studies/Reports Reviewed Today:  Myoview 07/23/14 Low risk Fixed inferior defect c/w prior infarct vs diaph. Atten. No ischemia. LVEF: 57%   Past Medical History  Diagnosis Date  . CAD (coronary artery disease)     a. LHC (7/11): Inf STEMI >>> inf AK, EF 35-40%, LAD 70-75%, mid CFX 70%, dist RCA 99% >>> PCI: 3.5 x 28 mm Promus DES to RCA;    . Diabetes mellitus     Non-insulin-dependent diabetes mellitus.   Marland Kitchen HTN (hypertension)   . Hyperlipemia   . DJD (degenerative joint disease)   . Seasonal allergies   . Ischemic cardiomyopathy     EF 35-40% at time of MI in 2011 >> improved to normal on Nuclear study in 2014  . Hx of cardiovascular stress test     a. Nuclear (3/14): Small inf defect - likely scar; no  ischemia, EF 59%; LOW RISK;  b. Lexiscan Myoview (1/16): No ischemia, fixed inferior defect consistent with prior infarct versus diaphragmatic attenuation, EF 57%, Low Risk    Past Surgical History  Procedure Laterality Date  . Release of left transcarpal ligament.    . Release of right transcarpal ligament.    . Percutaneous coronary intervention using a drug-eluting stent (promus)      Moderately    severe left anterior descending stenosis.  Moderate left     circumflex stenosis, moderate left ventricular dysfunction with   left  ventricular ejection fraction of 35% to 40%.      Current Outpatient Prescriptions  Medication Sig Dispense Refill  . ALPRAZolam (XANAX) 0.5 MG tablet Take 0.5 mg by mouth at bedtime.     Marland Kitchen aspirin 81 MG tablet Take 81 mg by mouth daily.      Marland Kitchen azelastine (ASTELIN) 137 MCG/SPRAY nasal spray Place 1 spray into the nose 2 (two) times daily as needed for rhinitis. Use in each nostril as directed    . Calcium Carbonate-Vitamin D (CALCIUM 600 + D PO) Take 1 tablet by mouth daily.     Marland Kitchen CINNAMON PO Take 1,500 mg by mouth 2 (two) times daily.    . Coenzyme Q10 (CO Q 10 PO) Take 1 capsule by mouth daily. 1  tab daily    . ezetimibe (ZETIA) 10 MG tablet Take 10 mg by mouth daily.    Marland Kitchen glimepiride (AMARYL) 1 MG tablet Take 1 mg by mouth daily before breakfast.    . hydrochlorothiazide (HYDRODIURIL) 25 MG tablet Take 25 mg by mouth daily.    Marland Kitchen lisinopril (PRINIVIL,ZESTRIL) 20 MG tablet Take 20 mg by mouth 2 (two) times daily.    . metFORMIN (GLUCOPHAGE) 500 MG tablet TAKE ONE TABLET IN THE MORNING AND 2 TABLET IN THE AFTERNOON DAILY    . Multiple Vitamin (MULTIVITAMIN) capsule Take 1 capsule by mouth daily.      Marland Kitchen NITROSTAT 0.4 MG SL tablet DISSOLVE 1 TABLET UNDER TONGUE EVERY 5 MINTUES AS NEEDED UP TO 3 DOSES (Patient taking differently: DISSOLVE 1 TABLET UNDER TONGUE EVERY 5 MINTUES AS NEEDED UP TO 3 DOSES FOR CHEST PAIN) 25 tablet 1  . Omega-3 Fatty Acids (FISH OIL) 1000  MG CAPS Take 1 capsule by mouth 2 (two) times daily.     Marland Kitchen omeprazole (PRILOSEC) 20 MG capsule Take 20 mg by mouth daily.     . pioglitazone (ACTOS) 15 MG tablet Take 15 mg by mouth daily.      No current facility-administered medications for this visit.    Allergies:   Dust mite extract; Pollen extract; and Shellfish-derived products    Social History:  The patient  reports that he has quit smoking. He does not have any smokeless tobacco history on file. He reports that he does not drink alcohol or use illicit drugs.   Family History:  The patient's family history includes Coronary artery disease in an other family member; Heart attack in his brother and father; Hypertension in his brother, father, and mother. There is no history of Stroke.    ROS:   Please see the history of present illness.   Review of Systems  Respiratory: Positive for cough and snoring.        Non-prod Intol of CPAP  Hematologic/Lymphatic: Negative for bleeding problem.  Musculoskeletal: Positive for arthritis and back pain.  Gastrointestinal: Negative for heartburn.  All other systems reviewed and are negative.     PHYSICAL EXAM: VS:  There were no vitals taken for this visit.    Wt Readings from Last 3 Encounters:  07/22/14 199 lb (90.266 kg)  07/16/14 202 lb (91.627 kg)  02/28/14 198 lb (89.812 kg)     GEN: Well nourished, well developed, in no acute distress HEENT: normal Neck: no JVD, no masses Cardiac:  Normal S1/S2, RRR; no murmur, no rubs or gallops, no edema  Respiratory:  clear to auscultation bilaterally, no wheezing, rhonchi or rales. GI: soft, nontender, nondistended, + BS MS: no deformity or atrophy Skin: warm and dry  Neuro:  CNs II-XII intact, Strength and sensation are intact Psych: Normal affect   EKG:  EKG is not ordered today.  It demonstrates:   n/a   Recent Labs: 07/16/2014: BUN 23; Creatinine 1.12; Hemoglobin 13.8; Platelets 181.0; Potassium 4.1; Sodium 136    Lipid  Panel    Component Value Date/Time   CHOL  01/22/2010 0832    144        ATP III CLASSIFICATION:  <200     mg/dL   Desirable  200-239  mg/dL   Borderline High  >=240    mg/dL   High          TRIG 140 01/22/2010 0832   HDL 37* 01/22/2010 0832   CHOLHDL 3.9 01/22/2010 1610  VLDL 28 01/22/2010 0832   LDLCALC  01/22/2010 0832    79        Total Cholesterol/HDL:CHD Risk Coronary Heart Disease Risk Table                     Men   Women  1/2 Average Risk   3.4   3.3  Average Risk       5.0   4.4  2 X Average Risk   9.6   7.1  3 X Average Risk  23.4   11.0        Use the calculated Patient Ratio above and the CHD Risk Table to determine the patient's CHD Risk.        ATP III CLASSIFICATION (LDL):  <100     mg/dL   Optimal  100-129  mg/dL   Near or Above                    Optimal  130-159  mg/dL   Borderline  160-189  mg/dL   High  >190     mg/dL   Very High      ASSESSMENT AND PLAN:  1.  Coronary Artery Disease: Overall stable. His chest pain is atypical and not likely related to CAD. Recent Myoview is low risk. Continue aspirin, ACE inhibitor. Try to resume statin therapy as outlined below. 2.  Hyperlipidemia: I have asked him to resume Lipitor on Monday, Wednesday, Friday. If he cannot tolerate statins, consider referral to lipid clinic (? PCSK-9 trial). 3.  Hypertension: Blood pressure is at target. Continue current therapy. 4.  Ischemic cardiomyopathy: He cannot tolerate beta blockers given bradycardia. Continue ACE inhibitor. Recent Myoview with normal ejection fraction. 5.  Sleep apnea: He has had trouble tolerating CPAP in the past. I offered him referral to Dr. Radford Pax to see if she could help. He would like to try this.   Current medicines are reviewed at length with the patient today.  The patient has concerns regarding medicines.  The following changes have been made:  As above.   Labs/ tests ordered today include:  No orders of the defined types were placed in  this encounter.     Disposition:   FU with Dr. Sherren Mocha  in  02/2015. Refer to Dr. Radford Pax for OSA.    Signed, Versie Starks, MHS 08/13/2014 8:46 AM    [facility] [place], [address], [place]  [address] Phone: [phone]; Fax: [phone]

## 2014-09-10 ENCOUNTER — Encounter: Payer: Self-pay | Admitting: Cardiology

## 2014-09-10 DIAGNOSIS — G4733 Obstructive sleep apnea (adult) (pediatric): Secondary | ICD-10-CM

## 2014-09-10 HISTORY — DX: Obstructive sleep apnea (adult) (pediatric): G47.33

## 2014-09-10 NOTE — Progress Notes (Signed)
Cardiology Office Note   Date:  09/11/2014   ID:  Zachary Nolan, DOB [birth year], MRN [id number]  PCP:  Zachary Gravel, MD  Cardiologist:   Zachary Margarita, MD   Chief Complaint  Patient presents with  . Sleep Apnea  . Hypertension      History of Present Illness: Zachary Nolan is a 74 y.o. male with a hx of CAD s/p inf STEMI in 2011 Rx with a DES to the RCA, ICM with EF 35-40% at time of MI >> improved to normal by nuclear study in 2014, DM2, HTN, HL. He is intol to statins. He has not been on beta blocker Rx b/c of bradycardia.He recently had complaints of exertional chest pain. Troponin was normal. Lexiscan Myoview returned low risk, negative for ischemia. He has a history of OSA and has not tolerated CPAP in the past.  He is referred now for evaluation.  He says that he was not happy with the sleep service that issued the device and he sent it back.   He says that he snores very badly.  He has allergies that contributes to nasal congestion in the middle of the night and results in him breathing through his mouth.  He used to take allergy shots but no longer does that.  He feels very tired in the am due to poor sleep.  He feels sleepy throughout the day.  He has some problems with restless legs.      Past Medical History  Diagnosis Date  . CAD (coronary artery disease)     a. LHC (7/11): Inf STEMI >>> inf AK, EF 35-40%, LAD 70-75%, mid CFX 70%, dist RCA 99% >>> PCI: 3.5 x 28 mm Promus DES to RCA;    . Diabetes mellitus     Non-insulin-dependent diabetes mellitus.   Marland Kitchen HTN (hypertension)   . Hyperlipemia   . DJD (degenerative joint disease)   . Seasonal allergies   . Ischemic cardiomyopathy     EF 35-40% at time of MI in 2011 >> improved to normal on Nuclear study in 2014  . Hx of cardiovascular stress test     a. Nuclear (3/14): Small inf defect - likely scar; no ischemia, EF 59%; LOW RISK;  b. Lexiscan Myoview (1/16): No ischemia, fixed inferior defect consistent with prior  infarct versus diaphragmatic attenuation, EF 57%, Low Risk  . OSA (obstructive sleep apnea) 09/10/2014    Past Surgical History  Procedure Laterality Date  . Release of left transcarpal ligament.    . Release of right transcarpal ligament.    . Percutaneous coronary intervention using a drug-eluting stent (promus)      Moderately    severe left anterior descending stenosis.  Moderate left     circumflex stenosis, moderate left ventricular dysfunction with   left  ventricular ejection fraction of 35% to 40%.      Current Outpatient Prescriptions  Medication Sig Dispense Refill  . aspirin 81 MG tablet Take 81 mg by mouth daily.      Marland Kitchen azelastine (ASTELIN) 137 MCG/SPRAY nasal spray Place 1 spray into the nose 2 (two) times daily as needed for rhinitis. Use in each nostril as directed    . Calcium Carbonate-Vitamin D (CALCIUM 600 + D PO) Take 1 tablet by mouth daily.     Marland Kitchen CINNAMON PO Take 1,500 mg by mouth 2 (two) times daily.    . Coenzyme Q10 (CO Q 10 PO) Take 1 capsule by mouth daily. 1 tab daily    .  glimepiride (AMARYL) 1 MG tablet Take 1 mg by mouth daily before breakfast.    . hydrochlorothiazide (HYDRODIURIL) 25 MG tablet Take 25 mg by mouth daily.    Marland Kitchen lisinopril (PRINIVIL,ZESTRIL) 20 MG tablet Take 20 mg by mouth 2 (two) times daily.    . metFORMIN (GLUCOPHAGE) 500 MG tablet TAKE ONE TABLET IN THE MORNING AND 2 TABLET IN THE AFTERNOON DAILY    . Multiple Vitamin (MULTIVITAMIN) capsule Take 1 capsule by mouth daily.      Marland Kitchen NITROSTAT 0.4 MG SL tablet DISSOLVE 1 TABLET UNDER TONGUE EVERY 5 MINTUES AS NEEDED UP TO 3 DOSES (Patient taking differently: DISSOLVE 1 TABLET UNDER TONGUE EVERY 5 MINTUES AS NEEDED UP TO 3 DOSES FOR CHEST PAIN) 25 tablet 1  . Omega-3 Fatty Acids (FISH OIL) 1000 MG CAPS Take 1 capsule by mouth 2 (two) times daily.     Marland Kitchen omeprazole (PRILOSEC) 20 MG capsule Take 20 mg by mouth daily.     . pioglitazone (ACTOS) 15 MG tablet Take 15 mg by mouth daily.      No  current facility-administered medications for this visit.    Allergies:   Dust mite extract; Pollen extract; and Shellfish-derived products    Social History:  The patient  reports that he has quit smoking. He does not have any smokeless tobacco history on file. He reports that he does not drink alcohol or use illicit drugs.   Family History:  The patient's family history includes Coronary artery disease in an other family member; Heart attack in his brother and father; Hypertension in his brother, father, and mother. There is no history of Stroke.    ROS:  Please see the history of present illness.   Otherwise, review of systems are positive for none.   All other systems are reviewed and negative.    PHYSICAL EXAM: VS:  BP 126/80 mmHg  Pulse 85  Ht 5\' 9"  (1.753 m)  Wt 206 lb (93.441 kg)  BMI 30.41 kg/m2 , BMI Body mass index is 30.41 kg/(m^2). GEN: Well nourished, well developed, in no acute distress HEENT: normal Neck: no JVD, carotid bruits, or masses Cardiac: RRR; no murmurs, rubs, or gallops,no edema  Respiratory:  clear to auscultation bilaterally, normal work of breathing GI: soft, nontender, nondistended, + BS MS: no deformity or atrophy Skin: warm and dry, no rash Neuro:  Strength and sensation are intact Psych: euthymic mood, full affect   EKG:  EKG is not ordered today.    Recent Labs: 07/16/2014: BUN 23; Creatinine 1.12; Hemoglobin 13.8; Platelets 181.0; Potassium 4.1; Sodium 136    Lipid Panel    Component Value Date/Time   CHOL  01/22/2010 0832    144        ATP III CLASSIFICATION:  <200     mg/dL   Desirable  200-239  mg/dL   Borderline High  >=240    mg/dL   High          TRIG 140 01/22/2010 0832   HDL 37* 01/22/2010 0832   CHOLHDL 3.9 01/22/2010 0832   VLDL 28 01/22/2010 0832   LDLCALC  01/22/2010 0832    79        Total Cholesterol/HDL:CHD Risk Coronary Heart Disease Risk Table                     Men   Women  1/2 Average Risk   3.4   3.3   Average Risk  5.0   4.4  2 X Average Risk   9.6   7.1  3 X Average Risk  23.4   11.0        Use the calculated Patient Ratio above and the CHD Risk Table to determine the patient's CHD Risk.        ATP III CLASSIFICATION (LDL):  <100     mg/dL   Optimal  100-129  mg/dL   Near or Above                    Optimal  130-159  mg/dL   Borderline  160-189  mg/dL   High  >190     mg/dL   Very High      Wt Readings from Last 3 Encounters:  09/11/14 206 lb (93.441 kg)  08/13/14 204 lb (92.534 kg)  07/22/14 199 lb (90.266 kg)    ASSESSMENT AND PLAN:  1. Hypertension: Blood pressure is at target. Continue current therapy. 2. Sleep apnea: He got very frustrated with the DME company and handed his device back in.  He says that could sleep with it but did not like the full face mask.  He would like to try the nasal pillow mask.  His sleep study was 2 years ago so we will have to get a new split night study. 3. Obesity - I have encouraged him to get back to the gym    Current medicines are reviewed at length with the patient today.  The patient does not have concerns regarding medicines.  The following changes have been made:  no change  Labs/ tests ordered today include: split night PSG   Orders Placed This Encounter  Procedures  . Split night study     Disposition:   FU with me after sleep study  SignedSueanne Margarita, MD  09/11/2014 1:43 PM    [facility] [address], [place], [place]  [address] Phone: [phone]; Fax: [phone]

## 2014-09-11 ENCOUNTER — Ambulatory Visit (INDEPENDENT_AMBULATORY_CARE_PROVIDER_SITE_OTHER): Payer: Medicare Other | Admitting: Cardiology

## 2014-09-11 ENCOUNTER — Encounter: Payer: Self-pay | Admitting: Cardiology

## 2014-09-11 VITALS — BP 126/80 | HR 85 | Ht 69.0 in | Wt 206.0 lb

## 2014-09-11 DIAGNOSIS — E669 Obesity, unspecified: Secondary | ICD-10-CM

## 2014-09-11 DIAGNOSIS — G4733 Obstructive sleep apnea (adult) (pediatric): Secondary | ICD-10-CM

## 2014-09-11 DIAGNOSIS — I255 Ischemic cardiomyopathy: Secondary | ICD-10-CM | POA: Diagnosis not present

## 2014-09-11 DIAGNOSIS — I10 Essential (primary) hypertension: Secondary | ICD-10-CM | POA: Diagnosis not present

## 2014-09-11 NOTE — Patient Instructions (Signed)
Your physician has recommended that you have a sleep study. This test records several body functions during sleep, including: brain activity, eye movement, oxygen and carbon dioxide blood levels, heart rate and rhythm, breathing rate and rhythm, the flow of air through your mouth and nose, snoring, body muscle movements, and chest and belly movement.  Your physician recommends that you schedule a follow-up appointment AS NEEDED with Dr. Radford Pax pending the results of your sleep study.

## 2014-10-22 DIAGNOSIS — I10 Essential (primary) hypertension: Secondary | ICD-10-CM | POA: Diagnosis not present

## 2014-11-05 DIAGNOSIS — I10 Essential (primary) hypertension: Secondary | ICD-10-CM | POA: Diagnosis not present

## 2014-11-14 ENCOUNTER — Ambulatory Visit (HOSPITAL_BASED_OUTPATIENT_CLINIC_OR_DEPARTMENT_OTHER): Payer: Medicare Other | Attending: Cardiology

## 2014-11-14 VITALS — Ht 68.0 in | Wt 200.0 lb

## 2014-11-14 DIAGNOSIS — G4733 Obstructive sleep apnea (adult) (pediatric): Secondary | ICD-10-CM | POA: Diagnosis not present

## 2014-11-14 DIAGNOSIS — G4730 Sleep apnea, unspecified: Secondary | ICD-10-CM

## 2014-11-14 DIAGNOSIS — I491 Atrial premature depolarization: Secondary | ICD-10-CM | POA: Diagnosis not present

## 2014-11-14 DIAGNOSIS — R0683 Snoring: Secondary | ICD-10-CM | POA: Diagnosis present

## 2014-11-19 DIAGNOSIS — I10 Essential (primary) hypertension: Secondary | ICD-10-CM | POA: Diagnosis not present

## 2014-11-23 ENCOUNTER — Telehealth: Payer: Self-pay | Admitting: Cardiology

## 2014-11-23 DIAGNOSIS — I499 Cardiac arrhythmia, unspecified: Secondary | ICD-10-CM

## 2014-11-23 NOTE — Telephone Encounter (Signed)
Patient had some ? arrhythmais during his sleep study.  Please order a 48 hour Holter monitor to assess further

## 2014-11-23 NOTE — Telephone Encounter (Signed)
Please let patient know that they have significant sleep apnea and had successful CPAP titration and will be set up with CPAP unit.  Please let DME know that order is in EPIC.  Please set patient up for OV in 10 weeks 

## 2014-11-23 NOTE — Sleep Study (Signed)
NAME: Zachary Nolan DATE OF BIRTH:  10-May-1941 MEDICAL RECORD NUMBER [id number]  LOCATION: [facility]  PHYSICIAN: TURNER,TRACI R  DATE OF STUDY: 11/14/2014  SLEEP STUDY TYPE: Split Night Nocturnal Polysomnogram with CPAP titration               REFERRING PHYSICIAN: Sueanne Margarita, MD  INDICATION FOR STUDY: loud snoring, excessive daytime sleepiness  EPWORTH SLEEPINESS SCORE: 4 HEIGHT: 5\' 8"  (172.7 cm)  WEIGHT: 200 lb (90.719 kg)    Body mass index is 30.42 kg/(m^2).  NECK SIZE: 17 in.  MEDICATIONS: Reviewed in the chart  SLEEP ARCHITECTURE: During the diagnostic portion of the study, the patient slept for a total of 121 minutes out of a total sleep period  of 238 minutes.  There was no REM sleep or slow wave sleep.  The onset to sleep latency was 12 minutes.  The sleep efficiency was reduced at 48%.  During the CPAP portion of the study, the total sleep time was 108 minutes out of a total sleep period time of 134 minutes.  The onset to sleep latency was 11 minutes and onset to REM sleep latency was short at 44 minutes.  The sleep efficiency was reduced at 74%.    RESPIRATORY DATA: During the diagnostic portion of the study, there were 83 apneas, of which, 76 were obstructive, 4 were central and 3 were mixed apneas.  There were 54 hypopneas noted.  Most events occurred in the non supine position and all occurred in NREM sleep.  The AHI was 67.9 events per hour consistent with severe obstructive sleep apnea/hypopnea syndrome.  The patient was started on CPAP at 4cm H2O and titrated for respiratory events and snoring to 12cm H2O.  The patient was able to achieve REM sleep but unable to maintain the supine position at an optimum pressure of 9cm H2O.  The AHI was 3 events per hour.    OXYGEN DATA: During the diagnostic portion of the study, the lowest oxygen saturation was 85%.  The average oxygen saturation was 94%.  During the CPAP titration, the lowest oxygen saturation  was 86% and the average oxygen saturation was 86%.  The lowest oxygen saturation at 9cm H2O was 89%.    CARDIAC DATA: The patient maintained NSR with PAC's but did appear, at times, to have beats the no P waves noted.   EKG was poor in quality.  MOVEMENT/PARASOMNIA: During the diagnostic portion of the study, there were an increased number of limb movements with a PLMS index of 35 movements per hour.  During the CPAP titration this increased to 87 movements per hour.  There were no REM sleep behavior disorders noted.  IMPRESSION/ RECOMMENDATION:   1.  Severe obstructive sleep apnea/hypopnea syndrome with an AHI of 67.9 events per hour.   Most events occurred in the non supine position and all occurred in NREM sleep. 2.  Reduced sleep efficiency with increased frequency of arousals due to respiratory events. 3.  Abnormal sleep architecture with no slow wave or REM sleep noted during the diagnostic portion of the study. 4.  Oxygen desaturations associated with respiratory events with the lowest oxygen saturation of 85% during the diagnostic portion of the study.  The lowest oxygen saturation on CPAP at 9cm H2O was 89%. 5.  Arrhythmias were noted with PAC's and ? Loss of p waves but the EKG rhythm was of poor quality.  Recommend 24 hour Holter monitor to assess further. 6.  Successful CPAP  titration to 9cm H2O.   7.  The patient should be counseled on good sleep hygiene and weight loss.   8.  Recommend ResMed CPAP with heated humidifier at 9cm H2O with large ResMed AirFit F10 full face mask.  Signed: Sueanne Margarita Diplomate, American Board of Sleep Medicine  ELECTRONICALLY SIGNED ON:  11/23/2014, 7:55 PM [place] PH: [phone]   FX: [phone] [place]

## 2014-11-23 NOTE — Addendum Note (Signed)
Addended by: Sueanne Margarita on: 11/23/2014 08:14 PM   Modules accepted: Orders

## 2014-11-25 NOTE — Telephone Encounter (Signed)
Patient is aware of results.  He is also aware that Dr. Radford Pax would like for him to have a 48 hour monitor. Will get this scheduled. [facility] has been notified that orders are in the system.

## 2014-11-25 NOTE — Addendum Note (Signed)
Addended by: Andres Ege on: 11/25/2014 02:50 PM   Modules accepted: Orders

## 2014-11-25 NOTE — Telephone Encounter (Signed)
Holter scheduled.  See other phone note.

## 2014-12-03 ENCOUNTER — Ambulatory Visit (INDEPENDENT_AMBULATORY_CARE_PROVIDER_SITE_OTHER): Payer: Medicare Other

## 2014-12-03 DIAGNOSIS — I499 Cardiac arrhythmia, unspecified: Secondary | ICD-10-CM

## 2015-01-07 ENCOUNTER — Encounter: Payer: Self-pay | Admitting: Cardiology

## 2015-02-13 DIAGNOSIS — E119 Type 2 diabetes mellitus without complications: Secondary | ICD-10-CM | POA: Diagnosis not present

## 2015-02-13 DIAGNOSIS — I10 Essential (primary) hypertension: Secondary | ICD-10-CM | POA: Diagnosis not present

## 2015-02-20 DIAGNOSIS — I2510 Atherosclerotic heart disease of native coronary artery without angina pectoris: Secondary | ICD-10-CM | POA: Diagnosis not present

## 2015-02-20 DIAGNOSIS — I10 Essential (primary) hypertension: Secondary | ICD-10-CM | POA: Diagnosis not present

## 2015-02-20 DIAGNOSIS — E119 Type 2 diabetes mellitus without complications: Secondary | ICD-10-CM | POA: Diagnosis not present

## 2015-02-20 DIAGNOSIS — E780 Pure hypercholesterolemia: Secondary | ICD-10-CM | POA: Diagnosis not present

## 2015-03-05 ENCOUNTER — Ambulatory Visit (INDEPENDENT_AMBULATORY_CARE_PROVIDER_SITE_OTHER): Payer: Medicare Other | Admitting: Cardiology

## 2015-03-05 ENCOUNTER — Encounter: Payer: Self-pay | Admitting: Cardiology

## 2015-03-05 VITALS — BP 124/60 | HR 62 | Ht 68.0 in | Wt 201.6 lb

## 2015-03-05 DIAGNOSIS — I10 Essential (primary) hypertension: Secondary | ICD-10-CM | POA: Diagnosis not present

## 2015-03-05 DIAGNOSIS — E669 Obesity, unspecified: Secondary | ICD-10-CM

## 2015-03-05 DIAGNOSIS — I255 Ischemic cardiomyopathy: Secondary | ICD-10-CM

## 2015-03-05 DIAGNOSIS — G4733 Obstructive sleep apnea (adult) (pediatric): Secondary | ICD-10-CM

## 2015-03-05 NOTE — Patient Instructions (Signed)
Medication Instructions:  Your physician recommends that you continue on your current medications as directed. Please refer to the Current Medication list given to you today.   Labwork: None  Testing/Procedures: None  Follow-Up: Your physician wants you to follow-up in: 6 months with Dr. Radford Pax. You will receive a reminder letter in the mail two months in advance. If you don't receive a letter, please call our office to schedule the follow-up appointment.   Any Other Special Instructions Will Be Listed Below (If Applicable). [facility] will be in touch with you soon about your auto-titration. If you are not contacted within the next week, please call Bethany at [phone]. She is our office CPAP assistant!

## 2015-03-05 NOTE — Progress Notes (Signed)
Cardiology Office Note   Date:  03/05/2015   ID:  Antario Yasuda, DOB [birth year], MRN [id number]  PCP:  Jani Gravel, MD    Chief Complaint  Patient presents with  . OSA      History of Present Illness: Dontrail Blackwell is a 74 y.o. male with a hx of CAD s/p inf STEMI in 2011 Rx with a DES to the RCA, ICM with EF 35-40% at time of MI >> improved to normal by nuclear study in 2014, DM2, HTN, HL. He is intol to statins. He has not been on beta blocker Rx b/c of bradycardia.. He has a history of OSA and has not tolerated CPAP in the past. He underwent PSG showing severe OSA with an AHI of 68/hr and underwent CPAP titration to 9cm H2O.  He now presents back today for followup.  He is tolerating his CPAP well.  He tolerates the full face mask well.  He did not like the nasal mask.  He is a mouth breather.  Since starting the CPAP he feels rested in the am and has no daytime sleepiness.  He is sleeping better.  He no longer snores.      Past Medical History  Diagnosis Date  . CAD (coronary artery disease)     a. LHC (7/11): Inf STEMI >>> inf AK, EF 35-40%, LAD 70-75%, mid CFX 70%, dist RCA 99% >>> PCI: 3.5 x 28 mm Promus DES to RCA;    . Diabetes mellitus     Non-insulin-dependent diabetes mellitus.   Marland Kitchen HTN (hypertension)   . Hyperlipemia   . DJD (degenerative joint disease)   . Seasonal allergies   . Ischemic cardiomyopathy     EF 35-40% at time of MI in 2011 >> improved to normal on Nuclear study in 2014  . Hx of cardiovascular stress test     a. Nuclear (3/14): Small inf defect - likely scar; no ischemia, EF 59%; LOW RISK;  b. Lexiscan Myoview (1/16): No ischemia, fixed inferior defect consistent with prior infarct versus diaphragmatic attenuation, EF 57%, Low Risk  . OSA (obstructive sleep apnea) 09/10/2014    severe with AHI 68/hr    Past Surgical History  Procedure Laterality Date  . Release of left transcarpal ligament.    . Release of right transcarpal  ligament.    . Percutaneous coronary intervention using a drug-eluting stent (promus)      Moderately    severe left anterior descending stenosis.  Moderate left     circumflex stenosis, moderate left ventricular dysfunction with   left  ventricular ejection fraction of 35% to 40%.      Current Outpatient Prescriptions  Medication Sig Dispense Refill  . ALPRAZolam (XANAX) 0.5 MG tablet Take 0.5 mg by mouth daily.    Marland Kitchen aspirin 81 MG tablet Take 81 mg by mouth daily.      Marland Kitchen atorvastatin (LIPITOR) 20 MG tablet Take 20 mg by mouth daily.    Marland Kitchen azelastine (ASTELIN) 137 MCG/SPRAY nasal spray Place 1 spray into the nose 2 (two) times daily as needed for rhinitis. Use in each nostril as directed    . Calcium Carbonate-Vitamin D (CALCIUM 600 + D PO) Take 1 tablet by mouth daily.     Marland Kitchen CINNAMON PO Take 1,500 mg by mouth 2 (two) times daily.    . Coenzyme Q10 (CO Q 10 PO) Take 1 capsule by mouth  daily. 1 tab daily    . glimepiride (AMARYL) 1 MG tablet Take 1 mg by mouth daily before breakfast.    . hydrochlorothiazide (HYDRODIURIL) 25 MG tablet Take 25 mg by mouth daily.    Marland Kitchen lisinopril (PRINIVIL,ZESTRIL) 20 MG tablet Take 20 mg by mouth 2 (two) times daily.    . metFORMIN (GLUCOPHAGE) 500 MG tablet TAKE ONE TABLET IN THE MORNING AND 2 TABLET IN THE AFTERNOON DAILY    . Multiple Vitamin (MULTIVITAMIN) capsule Take 1 capsule by mouth daily.      . nitroGLYCERIN (NITROSTAT) 0.4 MG SL tablet Place 0.4 mg under the tongue every 5 (five) minutes as needed for chest pain.    . Omega-3 Fatty Acids (FISH OIL) 1000 MG CAPS Take 1 capsule by mouth 2 (two) times daily.     Marland Kitchen omeprazole (PRILOSEC) 20 MG capsule Take 20 mg by mouth daily.    . pioglitazone (ACTOS) 15 MG tablet Take 15 mg by mouth daily.     No current facility-administered medications for this visit.    Allergies:   Dust mite extract; Pollen extract; and Shellfish-derived products    Social History:  The patient  reports that he has quit  smoking. He does not have any smokeless tobacco history on file. He reports that he does not drink alcohol or use illicit drugs.   Family History:  The patient's family history includes Coronary artery disease in an other family member; Heart attack in his brother and father; Hypertension in his brother, father, and mother. There is no history of Stroke.    ROS:  Please see the history of present illness.   Otherwise, review of systems are positive for none.   All other systems are reviewed and negative.    PHYSICAL EXAM: VS:  BP 124/60 mmHg  Pulse 62  Ht 5\' 8"  (1.727 m)  Wt 201 lb 9.6 oz (91.445 kg)  BMI 30.66 kg/m2  SpO2 97% , BMI Body mass index is 30.66 kg/(m^2). GEN: Well nourished, well developed, in no acute distress HEENT: normal Neck: no JVD, carotid bruits, or masses Cardiac: RRR; no murmurs, rubs, or gallops,no edema  Respiratory:  clear to auscultation bilaterally, normal work of breathing GI: soft, nontender, nondistended, + BS MS: no deformity or atrophy Skin: warm and dry, no rash Neuro:  Strength and sensation are intact Psych: euthymic mood, full affect   EKG:  EKG is not ordered today.    Recent Labs: 07/16/2014: BUN 23; Creatinine, Ser 1.12; Hemoglobin 13.8; Platelets 181.0; Potassium 4.1; Sodium 136    Lipid Panel    Component Value Date/Time   CHOL  01/22/2010 0832    144        ATP III CLASSIFICATION:  <200     mg/dL   Desirable  200-239  mg/dL   Borderline High  >=240    mg/dL   High          TRIG 140 01/22/2010 0832   HDL 37* 01/22/2010 0832   CHOLHDL 3.9 01/22/2010 0832   VLDL 28 01/22/2010 0832   LDLCALC  01/22/2010 0832    79        Total Cholesterol/HDL:CHD Risk Coronary Heart Disease Risk Table                     Men   Women  1/2 Average Risk   3.4   3.3  Average Risk       5.0   4.4  2  X Average Risk   9.6   7.1  3 X Average Risk  23.4   11.0        Use the calculated Patient Ratio above and the CHD Risk Table to determine the  patient's CHD Risk.        ATP III CLASSIFICATION (LDL):  <100     mg/dL   Optimal  100-129  mg/dL   Near or Above                    Optimal  130-159  mg/dL   Borderline  160-189  mg/dL   High  >190     mg/dL   Very High      Wt Readings from Last 3 Encounters:  03/05/15 201 lb 9.6 oz (91.445 kg)  11/14/14 200 lb (90.719 kg)  09/11/14 206 lb (93.441 kg)        ASSESSMENT AND PLAN:  1.  Severe OSA with an AHI of 68/hr now on CPAP and tolerating well. His d/l today showed an AHI of 25.3/hr on 9cm H2O and 50% compliance in using more than 4 hours nightly.  I have recommended that we do a 2 week autotitration from 4-20cm H2O.  Patient has been using and benefiting from CPAP use and will continue to benefit from therapy.  2.  HTN - controlled on diuretic and ACE I 3.  Obesity - his exercise is limited by leg pain   Current medicines are reviewed at length with the patient today.  The patient does not have concerns regarding medicines.  The following changes have been made:  no change  Labs/ tests ordered today: See above Assessment and Plan No orders of the defined types were placed in this encounter.     Disposition:   FU with me in 6 months  Signed, Sueanne Margarita, MD  03/05/2015 8:26 AM    [facility] [place], [place], [place]  [address] Phone: [phone]; Fax: [phone]

## 2015-03-10 DIAGNOSIS — Z23 Encounter for immunization: Secondary | ICD-10-CM | POA: Diagnosis not present

## 2015-03-13 ENCOUNTER — Telehealth: Payer: Self-pay | Admitting: *Deleted

## 2015-03-13 DIAGNOSIS — G4733 Obstructive sleep apnea (adult) (pediatric): Secondary | ICD-10-CM

## 2015-03-13 NOTE — Telephone Encounter (Signed)
Patient states that you are doing a 2 week auto-titration and the top number blows pressure so hard that its waking him up and blowing through the mask.  He called [facility] and they advised him to call and see if you would change the auto-titration settings.

## 2015-03-14 NOTE — Telephone Encounter (Signed)
Lower autotitration to 4-16cm H2O

## 2015-03-16 NOTE — Addendum Note (Signed)
Addended by: Andres Ege on: 03/16/2015 09:51 AM   Modules accepted: Orders

## 2015-03-16 NOTE — Telephone Encounter (Signed)
Orders placed, [facility] Notified 

## 2015-03-24 ENCOUNTER — Encounter: Payer: Self-pay | Admitting: Cardiology

## 2015-05-01 ENCOUNTER — Encounter: Payer: Self-pay | Admitting: Cardiology

## 2015-06-12 ENCOUNTER — Encounter: Payer: Self-pay | Admitting: Cardiovascular Disease

## 2015-06-12 ENCOUNTER — Ambulatory Visit (INDEPENDENT_AMBULATORY_CARE_PROVIDER_SITE_OTHER): Payer: Medicare Other | Admitting: Cardiovascular Disease

## 2015-06-12 VITALS — BP 128/85 | HR 72 | Ht 69.0 in | Wt 203.0 lb

## 2015-06-12 DIAGNOSIS — E7800 Pure hypercholesterolemia, unspecified: Secondary | ICD-10-CM

## 2015-06-12 DIAGNOSIS — I10 Essential (primary) hypertension: Secondary | ICD-10-CM

## 2015-06-12 DIAGNOSIS — I255 Ischemic cardiomyopathy: Secondary | ICD-10-CM | POA: Diagnosis not present

## 2015-06-12 DIAGNOSIS — I2510 Atherosclerotic heart disease of native coronary artery without angina pectoris: Secondary | ICD-10-CM | POA: Diagnosis not present

## 2015-06-12 NOTE — Progress Notes (Signed)
Cardiology Office Note Date:  06/12/2015   ID:  Zachary Nolan, DOB 1940-09-09, MRN [id number]  PCP:  Jani Gravel, MD  Cardiologist:  Sherren Mocha, MD    Chief Complaint  Patient presents with  . Extremity Weakness    History of Present Illness: Zachary Nolan is a 74 y.o. male who presents for follow-up of CAD. He initially presented with an inferior wall MI in 2011. He was treated with primary PCI using a drug-eluting stent in the right coronary artery. He's been statin intolerant. Patient's last nuclear stress test in January 2016 was low risk with a small fixed inferior defect, LVEF 57% with normal wall motion. Medical therapy was recommended.  Today, he denies symptoms of palpitations, chest pain, shortness of breath, orthopnea, PND, dizziness, or syncope.  He reports mild ankle swelling at times. His biggest complaint is leg weakness. He's developed severe arthritis in both hips and knees. He is not able to walk very far at all. Also has arthritis in his left shoulder , so his activity level is quite low.   Past Medical History  Diagnosis Date  . CAD (coronary artery disease)     a. LHC (7/11): Inf STEMI >>> inf AK, EF 35-40%, LAD 70-75%, mid CFX 70%, dist RCA 99% >>> PCI: 3.5 x 28 mm Promus DES to RCA;    . Diabetes mellitus     Non-insulin-dependent diabetes mellitus.   Marland Kitchen HTN (hypertension)   . Hyperlipemia   . DJD (degenerative joint disease)   . Seasonal allergies   . Ischemic cardiomyopathy     EF 35-40% at time of MI in 2011 >> improved to normal on Nuclear study in 2014  . Hx of cardiovascular stress test     a. Nuclear (3/14): Small inf defect - likely scar; no ischemia, EF 59%; LOW RISK;  b. Lexiscan Myoview (1/16): No ischemia, fixed inferior defect consistent with prior infarct versus diaphragmatic attenuation, EF 57%, Low Risk  . OSA (obstructive sleep apnea) 09/10/2014    severe with AHI 68/hr    Past Surgical History  Procedure Laterality Date  . Release of  left transcarpal ligament.    . Release of right transcarpal ligament.    . Percutaneous coronary intervention using a drug-eluting stent (promus)      Moderately    severe left anterior descending stenosis.  Moderate left     circumflex stenosis, moderate left ventricular dysfunction with   left  ventricular ejection fraction of 35% to 40%.     Current Outpatient Prescriptions  Medication Sig Dispense Refill  . ALPRAZolam (XANAX) 0.5 MG tablet Take 0.5 mg by mouth daily.    Marland Kitchen aspirin 81 MG tablet Take 81 mg by mouth daily.      Marland Kitchen atorvastatin (LIPITOR) 20 MG tablet Take 20 mg by mouth daily.    Marland Kitchen azelastine (ASTELIN) 137 MCG/SPRAY nasal spray Place 1 spray into the nose 2 (two) times daily as needed for rhinitis. Use in each nostril as directed    . Calcium Carbonate-Vitamin D (CALCIUM 600 + D PO) Take 1 tablet by mouth daily.     . Cetirizine HCl (ZYRTEC ALLERGY) 10 MG CAPS Take 1 capsule by mouth as needed.    Marland Kitchen CINNAMON PO Take 1,500 mg by mouth 2 (two) times daily.    . Coenzyme Q10 (CO Q 10 PO) Take 1 capsule by mouth daily. 1 tab daily    . glimepiride (AMARYL) 1 MG tablet Take 1 mg by mouth daily  before breakfast.    . hydrochlorothiazide (HYDRODIURIL) 25 MG tablet Take 25 mg by mouth daily.    Marland Kitchen lisinopril (PRINIVIL,ZESTRIL) 20 MG tablet Take 20 mg by mouth 2 (two) times daily.    . metFORMIN (GLUCOPHAGE) 500 MG tablet TAKE ONE TABLET IN THE MORNING AND 2 TABLET IN THE AFTERNOON DAILY    . Multiple Vitamin (MULTIVITAMIN) capsule Take 1 capsule by mouth daily.      . nitroGLYCERIN (NITROSTAT) 0.4 MG SL tablet Place 0.4 mg under the tongue every 5 (five) minutes as needed for chest pain.    . Omega-3 Fatty Acids (FISH OIL) 1000 MG CAPS Take 1 capsule by mouth 2 (two) times daily.     Marland Kitchen omeprazole (PRILOSEC) 20 MG capsule Take 20 mg by mouth daily.    . pioglitazone (ACTOS) 15 MG tablet Take 15 mg by mouth daily.     No current facility-administered medications for this visit.     Allergies:   Dust mite extract; Pollen extract; and Shellfish-derived products   Social History:  The patient  reports that he has quit smoking. He does not have any smokeless tobacco history on file. He reports that he does not drink alcohol or use illicit drugs.   Family History:  The patient's  family history includes Heart attack in his brother and father; Hypertension in his brother, father, and mother. There is no history of Stroke.    ROS:  Please see the history of present illness.  Otherwise, review of systems is positive for  Leg swelling, hearing loss, visual disturbance , depression, back pain, muscle pain, dizziness , easy bruising, fatigue, balance problems, headaches.  All other systems are reviewed and negative.    PHYSICAL EXAM: VS:  BP 128/85 mmHg  Pulse 72  Ht 5\' 9"  (1.753 m)  Wt 203 lb (92.08 kg)  BMI 29.96 kg/m2 , BMI Body mass index is 29.96 kg/(m^2). GEN: Well nourished, well developed, pleasant overweight male in no acute distress HEENT: normal Neck: no JVD, no masses. No carotid bruits Cardiac: RRR without murmur or gallop                Respiratory:  clear to auscultation bilaterally, normal work of breathing GI: soft, nontender, nondistended, + BS MS: no deformity or atrophy Ext: no pretibial edema, pedal pulses 2+= bilaterally Skin: warm and dry, no rash Neuro:  Strength and sensation are intact Psych: euthymic mood, full affect  EKG:  EKG is ordered today. The ekg ordered today shows  Normal sinus rhythm 72 bpm, age indeterminate inferior infarct,  Otherwise within normal limits.  Recent Labs: 07/16/2014: BUN 23; Creatinine, Ser 1.12; Hemoglobin 13.8; Platelets 181.0; Potassium 4.1; Sodium 136   Lipid Panel     Component Value Date/Time   CHOL  01/22/2010 0832    144        ATP III CLASSIFICATION:  <200     mg/dL   Desirable  200-239  mg/dL   Borderline High  >=240    mg/dL   High          TRIG 140 01/22/2010 0832   HDL 37* 01/22/2010  0832   CHOLHDL 3.9 01/22/2010 0832   VLDL 28 01/22/2010 0832   LDLCALC  01/22/2010 0832    79        Total Cholesterol/HDL:CHD Risk Coronary Heart Disease Risk Table                     Men  Women  1/2 Average Risk   3.4   3.3  Average Risk       5.0   4.4  2 X Average Risk   9.6   7.1  3 X Average Risk  23.4   11.0        Use the calculated Patient Ratio above and the CHD Risk Table to determine the patient's CHD Risk.        ATP III CLASSIFICATION (LDL):  <100     mg/dL   Optimal  100-129  mg/dL   Near or Above                    Optimal  130-159  mg/dL   Borderline  160-189  mg/dL   High  >190     mg/dL   Very High      Wt Readings from Last 3 Encounters:  06/12/15 203 lb (92.08 kg)  03/05/15 201 lb 9.6 oz (91.445 kg)  11/14/14 200 lb (90.719 kg)    Cardiac Studies Reviewed: Myoview Scan 07/22/2014: Impression Exercise Capacity: Lexiscan with no exercise. BP Response: Normal blood pressure response. Clinical Symptoms: There is dyspnea. ECG Impression: No significant ST segment change suggestive of ischemia. Comparison with Prior Nuclear Study: Compared to 08/29/12, no change  Overall Impression: Low risk stress nuclear study with a small, moderate intensity, fixed inferior defect consistent with prior infarct vs diaphragmatic attenuation; no ischemia.  LV Ejection Fraction: 57%. LV Wall Motion: NL LV Function; NL Wall Motion  ASSESSMENT AND PLAN: 1.   CAD, native vessel, without angina: The patient's medications are reviewed and will be continued without change. He is on appropriate regimen. He's had episodic bradycardia and is not treated with a beta blocker.   2. Hyperlipidemia: the patient has been on and off of statin drugs because of leg weakness and pain. Most recently he has not appreciated significant changes when he is taking a statin. He is currently tolerating atorvastatin 20 mg daily with labs followed by Dr. Maudie Mercury.   3. Type 2 diabetes: patient is  taking metformin. We discussed the importance of weight loss as it relates to insulin resistance.   4. Essential hypertension: blood pressure controlled with lisinopril.  Current medicines are reviewed with the patient today.  The patient does not have concerns regarding medicines.  Labs/ tests ordered today include:   Orders Placed This Encounter  Procedures  . EKG 12-Lead   Disposition:   FU one year  Signed, Sherren Mocha, MD  06/12/2015 9:16 AM    [facility] [place], [place], [place]  [address] Phone: [phone]; Fax: [phone]

## 2015-06-12 NOTE — Patient Instructions (Signed)

## 2015-06-23 DIAGNOSIS — K219 Gastro-esophageal reflux disease without esophagitis: Secondary | ICD-10-CM | POA: Diagnosis not present

## 2015-06-23 DIAGNOSIS — Z125 Encounter for screening for malignant neoplasm of prostate: Secondary | ICD-10-CM | POA: Diagnosis not present

## 2015-06-23 DIAGNOSIS — R5383 Other fatigue: Secondary | ICD-10-CM | POA: Diagnosis not present

## 2015-06-23 DIAGNOSIS — I10 Essential (primary) hypertension: Secondary | ICD-10-CM | POA: Diagnosis not present

## 2015-07-28 ENCOUNTER — Other Ambulatory Visit (HOSPITAL_COMMUNITY): Payer: Self-pay | Admitting: Respiratory Therapy

## 2015-07-28 DIAGNOSIS — J45909 Unspecified asthma, uncomplicated: Secondary | ICD-10-CM

## 2015-07-28 DIAGNOSIS — I739 Peripheral vascular disease, unspecified: Secondary | ICD-10-CM | POA: Diagnosis not present

## 2015-07-28 DIAGNOSIS — Z0000 Encounter for general adult medical examination without abnormal findings: Secondary | ICD-10-CM | POA: Diagnosis not present

## 2015-07-30 DIAGNOSIS — M216X1 Other acquired deformities of right foot: Secondary | ICD-10-CM | POA: Diagnosis not present

## 2015-07-30 DIAGNOSIS — M216X2 Other acquired deformities of left foot: Secondary | ICD-10-CM | POA: Diagnosis not present

## 2015-07-30 DIAGNOSIS — M2042 Other hammer toe(s) (acquired), left foot: Secondary | ICD-10-CM | POA: Diagnosis not present

## 2015-07-30 DIAGNOSIS — E1140 Type 2 diabetes mellitus with diabetic neuropathy, unspecified: Secondary | ICD-10-CM | POA: Diagnosis not present

## 2015-07-30 DIAGNOSIS — L602 Onychogryphosis: Secondary | ICD-10-CM | POA: Diagnosis not present

## 2015-08-04 ENCOUNTER — Ambulatory Visit (HOSPITAL_COMMUNITY)
Admission: RE | Admit: 2015-08-04 | Discharge: 2015-08-04 | Disposition: A | Discharge status: Home / Self Care | Payer: Medicare Other | Source: Ambulatory Visit | Attending: Internal Medicine | Admitting: Internal Medicine

## 2015-08-04 DIAGNOSIS — J45909 Unspecified asthma, uncomplicated: Secondary | ICD-10-CM | POA: Insufficient documentation

## 2015-08-04 LAB — PULMONARY FUNCTION TEST
DL/VA % pred: 79 %
DL/VA: 3.56 ml/min/mmHg/L
DLCO UNC % PRED: 77 %
DLCO unc: 22.87 ml/min/mmHg
FEF 25-75 PRE: 2.47 L/s
FEF2575-%Pred-Pre: 123 %
FEV1-%Pred-Pre: 95 %
FEV1-Pre: 2.66 L
FEV1FVC-%PRED-PRE: 110 %
FEV6-%PRED-PRE: 93 %
FEV6-Pre: 3.36 L
FEV6FVC-%Pred-Pre: 107 %
FVC-%PRED-PRE: 86 %
FVC-Pre: 3.36 L
Pre FEV1/FVC ratio: 79 %
Pre FEV6/FVC Ratio: 100 %

## 2015-09-10 DIAGNOSIS — E119 Type 2 diabetes mellitus without complications: Secondary | ICD-10-CM | POA: Diagnosis not present

## 2015-09-10 DIAGNOSIS — H2513 Age-related nuclear cataract, bilateral: Secondary | ICD-10-CM | POA: Diagnosis not present

## 2015-10-13 ENCOUNTER — Ambulatory Visit (INDEPENDENT_AMBULATORY_CARE_PROVIDER_SITE_OTHER): Payer: Medicare Other | Admitting: Cardiology

## 2015-10-13 ENCOUNTER — Encounter: Payer: Self-pay | Admitting: Cardiology

## 2015-10-13 ENCOUNTER — Ambulatory Visit: Payer: Medicare Other | Admitting: Cardiology

## 2015-10-13 VITALS — BP 140/70 | HR 76 | Ht 69.0 in | Wt 208.0 lb

## 2015-10-13 DIAGNOSIS — G4733 Obstructive sleep apnea (adult) (pediatric): Secondary | ICD-10-CM

## 2015-10-13 DIAGNOSIS — E669 Obesity, unspecified: Secondary | ICD-10-CM | POA: Diagnosis not present

## 2015-10-13 DIAGNOSIS — I10 Essential (primary) hypertension: Secondary | ICD-10-CM

## 2015-10-13 NOTE — Patient Instructions (Signed)

## 2015-10-13 NOTE — Progress Notes (Signed)
Cardiology Office Note    Date:  10/13/2015   ID:  Zachary Nolan, DOB September 13, 1940, MRN [id number]  PCP:  Jani Gravel, MD  Cardiologist:  Sueanne Margarita, MD   Chief Complaint  Patient presents with  . Sleep Apnea    History of Present Illness:  Zachary Nolan is a 75 y.o. male with a hx of CAD s/p inf STEMI in 2011 Rx with a DES to the RCA, ICM with EF 35-40% at time of MI >> improved to normal by nuclear study in 2014, DM2, HTN, HL. He is intol to statins. He has not been on beta blocker Rx b/c of bradycardia.. He has a history of OSA and has not tolerated CPAP in the past. He underwent PSG showing severe OSA with an AHI of 68/hr and underwent CPAP titration to 9cm H2O. He now presents back today for followup. He is tolerating his CPAP well but had a few weeks with problems of restless legs when he would put the mask on.  That has stopped but now he is waking up hourly and will read a book and then wake up again.  He says that the restless legs are not bothering him at night now on the xanax.   He tolerates the full face mask well. He did not like the nasal mask. He is a mouth breather.He is feeling tired during the day now due to lack of sleep.  He does not nap during the day.  He does not drink ETOH.     Past Medical History  Diagnosis Date  . CAD (coronary artery disease)     a. LHC (7/11): Inf STEMI >>> inf AK, EF 35-40%, LAD 70-75%, mid CFX 70%, dist RCA 99% >>> PCI: 3.5 x 28 mm Promus DES to RCA;    . Diabetes mellitus     Non-insulin-dependent diabetes mellitus.   Marland Kitchen HTN (hypertension)   . Hyperlipemia   . DJD (degenerative joint disease)   . Seasonal allergies   . Ischemic cardiomyopathy     EF 35-40% at time of MI in 2011 >> improved to normal on Nuclear study in 2014  . Hx of cardiovascular stress test     a. Nuclear (3/14): Small inf defect - likely scar; no ischemia, EF 59%; LOW RISK;  b. Lexiscan Myoview (1/16): No ischemia, fixed inferior defect consistent with prior  infarct versus diaphragmatic attenuation, EF 57%, Low Risk  . OSA (obstructive sleep apnea) 09/10/2014    severe with AHI 68/hr    Past Surgical History  Procedure Laterality Date  . Release of left transcarpal ligament.    . Release of right transcarpal ligament.    . Percutaneous coronary intervention using a drug-eluting stent (promus)      Moderately    severe left anterior descending stenosis.  Moderate left     circumflex stenosis, moderate left ventricular dysfunction with   left  ventricular ejection fraction of 35% to 40%.     Current Medications: Outpatient Prescriptions Prior to Visit  Medication Sig Dispense Refill  . ALPRAZolam (XANAX) 0.5 MG tablet Take 0.5 mg by mouth daily.    Marland Kitchen aspirin 81 MG tablet Take 81 mg by mouth daily.      Marland Kitchen azelastine (ASTELIN) 137 MCG/SPRAY nasal spray Place 1 spray into the nose 2 (two) times daily as needed for rhinitis. Use in each nostril as directed    . Calcium Carbonate-Vitamin D (CALCIUM 600 + D PO) Take 1 tablet by mouth daily.     Marland Kitchen  Cetirizine HCl (ZYRTEC ALLERGY) 10 MG CAPS Take 1 capsule by mouth as needed (allergies).     . CINNAMON PO Take 1,500 mg by mouth 2 (two) times daily.    . Coenzyme Q10 (CO Q 10 PO) Take 1 capsule by mouth daily. 1 tab daily    . glimepiride (AMARYL) 1 MG tablet Take 1 mg by mouth daily before breakfast.    . hydrochlorothiazide (HYDRODIURIL) 25 MG tablet Take 25 mg by mouth daily.    Marland Kitchen lisinopril (PRINIVIL,ZESTRIL) 20 MG tablet Take 20 mg by mouth 2 (two) times daily.    . metFORMIN (GLUCOPHAGE) 500 MG tablet Take 500 mg by mouth daily with breakfast. Take 1 tablet by mouth each morning and take 2 tablets by mouth each afternoon    . Multiple Vitamin (MULTIVITAMIN) capsule Take 1 capsule by mouth daily.      . nitroGLYCERIN (NITROSTAT) 0.4 MG SL tablet Place 0.4 mg under the tongue every 5 (five) minutes as needed for chest pain.    . Omega-3 Fatty Acids (FISH OIL) 1000 MG CAPS Take 1 capsule by mouth 2  (two) times daily.     Marland Kitchen omeprazole (PRILOSEC) 20 MG capsule Take 20 mg by mouth daily.    . pioglitazone (ACTOS) 15 MG tablet Take 15 mg by mouth daily.    Marland Kitchen atorvastatin (LIPITOR) 20 MG tablet Take 20 mg by mouth daily.     No facility-administered medications prior to visit.     Allergies:   Dust mite extract; Pollen extract; Shellfish-derived products; and Statins   Social History   Social History  . Marital Status: Married    Spouse Name: N/A  . Number of Children: N/A  . Years of Education: N/A   Social History Main Topics  . Smoking status: Former Research scientist (life sciences)  . Smokeless tobacco: None  . Alcohol Use: No  . Drug Use: No  . Sexual Activity: Not Asked   Other Topics Concern  . None   Social History Narrative     Family History:  The patient's family history includes Heart attack in his brother and father; Hypertension in his brother, father, and mother. There is no history of Stroke.   ROS:   Please see the history of present illness.    Review of Systems  Constitution: Negative.  HENT: Negative.   Eyes: Negative.   Cardiovascular: Negative.   Respiratory: Negative.   Skin: Negative.   Musculoskeletal: Positive for back pain and muscle cramps.  Gastrointestinal: Negative.   Genitourinary: Negative.   Neurological: Negative.   Psychiatric/Behavioral: Positive for depression. The patient is nervous/anxious.    All other systems reviewed and are negative.   PHYSICAL EXAM:   VS:  BP 140/70 mmHg  Pulse 76  Ht 5\' 9"  (1.753 m)  Wt 208 lb (94.348 kg)  BMI 30.70 kg/m2   GEN: Well nourished, well developed, in no acute distress HEENT: normal Neck: no JVD, carotid bruits, or masses Cardiac: RRR; no murmurs, rubs, or gallops,no edema.  Intact distal pulses bilaterally.  Respiratory:  clear to auscultation bilaterally, normal work of breathing GI: soft, nontender, nondistended, + BS MS: no deformity or atrophy Skin: warm and dry, no rash Neuro:  Alert and Oriented x  3, Strength and sensation are intact Psych: euthymic mood, full affect  Wt Readings from Last 3 Encounters:  10/13/15 208 lb (94.348 kg)  06/12/15 203 lb (92.08 kg)  03/05/15 201 lb 9.6 oz (91.445 kg)      Studies/Labs Reviewed:  EKG:  EKG i  ordered today and shows NSR with PACs, inferior infarct , IRBBB and no ST changes  Recent Labs: No results found for requested labs within last 365 days.   Lipid Panel    Component Value Date/Time   CHOL  01/22/2010 0832    144        ATP III CLASSIFICATION:  <200     mg/dL   Desirable  200-239  mg/dL   Borderline High  >=240    mg/dL   High          TRIG 140 01/22/2010 0832   HDL 37* 01/22/2010 0832   CHOLHDL 3.9 01/22/2010 0832   VLDL 28 01/22/2010 0832   LDLCALC  01/22/2010 0832    79        Total Cholesterol/HDL:CHD Risk Coronary Heart Disease Risk Table                     Men   Women  1/2 Average Risk   3.4   3.3  Average Risk       5.0   4.4  2 X Average Risk   9.6   7.1  3 X Average Risk  23.4   11.0        Use the calculated Patient Ratio above and the CHD Risk Table to determine the patient's CHD Risk.        ATP III CLASSIFICATION (LDL):  <100     mg/dL   Optimal  100-129  mg/dL   Near or Above                    Optimal  130-159  mg/dL   Borderline  160-189  mg/dL   High  >190     mg/dL   Very High    Additional studies/ records that were reviewed today include:  none    ASSESSMENT:    1. OSA (obstructive sleep apnea)   2. Obesity   3. Essential hypertension, benign      PLAN:  In order of problems listed above:  1. OSA on CPAP and tolerating well.  His d/l today showed an AHI of 22.7/hr on 9cm H2O and only 30% compliant in using more than 4 hours nightly.  I encouraged him to try to be more compliant.  2. Obesity - I have encouraged him to get into a routine exercise program and cut back on portions and carbs 3. HTN - well controlled.  Continue HCTZ/ACE I 4. Insomnia - he has not tolerated  Lunesta and Ambien in the past due to severe nightmares.  His PCP has him on Xanax now.    Medication Adjustments/Labs and Tests Ordered: Current medicines are reviewed at length with the patient today.  Concerns regarding medicines are outlined above.  Medication changes, Labs and Tests ordered today are listed in the Patient Instructions below. There are no Patient Instructions on file for this visit.   Lurena Nida, MD  10/13/2015 1:01 PM    [facility] [place], [address], [place]  [address] Phone: [phone]; Fax: [phone]

## 2015-10-20 ENCOUNTER — Encounter: Payer: Self-pay | Admitting: Cardiology

## 2016-01-14 DIAGNOSIS — L602 Onychogryphosis: Secondary | ICD-10-CM | POA: Diagnosis not present

## 2016-01-14 DIAGNOSIS — E1351 Other specified diabetes mellitus with diabetic peripheral angiopathy without gangrene: Secondary | ICD-10-CM | POA: Diagnosis not present

## 2016-01-14 DIAGNOSIS — I70293 Other atherosclerosis of native arteries of extremities, bilateral legs: Secondary | ICD-10-CM | POA: Diagnosis not present

## 2016-02-22 ENCOUNTER — Other Ambulatory Visit: Payer: Self-pay

## 2016-04-11 DIAGNOSIS — M1612 Unilateral primary osteoarthritis, left hip: Secondary | ICD-10-CM | POA: Diagnosis not present

## 2016-04-11 DIAGNOSIS — M47816 Spondylosis without myelopathy or radiculopathy, lumbar region: Secondary | ICD-10-CM | POA: Diagnosis not present

## 2016-05-24 DIAGNOSIS — R0602 Shortness of breath: Secondary | ICD-10-CM | POA: Diagnosis not present

## 2016-05-24 DIAGNOSIS — Z23 Encounter for immunization: Secondary | ICD-10-CM | POA: Diagnosis not present

## 2016-05-24 DIAGNOSIS — E119 Type 2 diabetes mellitus without complications: Secondary | ICD-10-CM | POA: Diagnosis not present

## 2016-05-24 DIAGNOSIS — I2510 Atherosclerotic heart disease of native coronary artery without angina pectoris: Secondary | ICD-10-CM | POA: Diagnosis not present

## 2016-05-24 DIAGNOSIS — I10 Essential (primary) hypertension: Secondary | ICD-10-CM | POA: Diagnosis not present

## 2016-05-24 DIAGNOSIS — R0600 Dyspnea, unspecified: Secondary | ICD-10-CM | POA: Diagnosis not present

## 2016-05-25 ENCOUNTER — Other Ambulatory Visit: Payer: Self-pay | Admitting: Internal Medicine

## 2016-05-25 DIAGNOSIS — R0609 Other forms of dyspnea: Principal | ICD-10-CM

## 2016-05-26 DIAGNOSIS — E119 Type 2 diabetes mellitus without complications: Secondary | ICD-10-CM | POA: Diagnosis not present

## 2016-05-26 DIAGNOSIS — R0600 Dyspnea, unspecified: Secondary | ICD-10-CM | POA: Diagnosis not present

## 2016-05-26 DIAGNOSIS — E291 Testicular hypofunction: Secondary | ICD-10-CM | POA: Diagnosis not present

## 2016-05-26 DIAGNOSIS — I10 Essential (primary) hypertension: Secondary | ICD-10-CM | POA: Diagnosis not present

## 2016-05-26 DIAGNOSIS — Z125 Encounter for screening for malignant neoplasm of prostate: Secondary | ICD-10-CM | POA: Diagnosis not present

## 2016-05-31 DIAGNOSIS — I2510 Atherosclerotic heart disease of native coronary artery without angina pectoris: Secondary | ICD-10-CM | POA: Diagnosis not present

## 2016-05-31 DIAGNOSIS — E7800 Pure hypercholesterolemia, unspecified: Secondary | ICD-10-CM | POA: Diagnosis not present

## 2016-05-31 DIAGNOSIS — E119 Type 2 diabetes mellitus without complications: Secondary | ICD-10-CM | POA: Diagnosis not present

## 2016-06-14 ENCOUNTER — Other Ambulatory Visit (HOSPITAL_COMMUNITY): Payer: Medicare Other

## 2016-06-24 ENCOUNTER — Ambulatory Visit (HOSPITAL_COMMUNITY): Payer: Medicare Other | Attending: Cardiovascular Disease

## 2016-06-24 ENCOUNTER — Other Ambulatory Visit: Payer: Self-pay

## 2016-06-24 DIAGNOSIS — Z87891 Personal history of nicotine dependence: Secondary | ICD-10-CM | POA: Diagnosis not present

## 2016-06-24 DIAGNOSIS — R0609 Other forms of dyspnea: Secondary | ICD-10-CM | POA: Insufficient documentation

## 2016-06-24 DIAGNOSIS — E785 Hyperlipidemia, unspecified: Secondary | ICD-10-CM | POA: Diagnosis not present

## 2016-06-24 DIAGNOSIS — E669 Obesity, unspecified: Secondary | ICD-10-CM | POA: Insufficient documentation

## 2016-06-24 DIAGNOSIS — G4733 Obstructive sleep apnea (adult) (pediatric): Secondary | ICD-10-CM | POA: Insufficient documentation

## 2016-06-29 DIAGNOSIS — L602 Onychogryphosis: Secondary | ICD-10-CM | POA: Diagnosis not present

## 2016-06-29 DIAGNOSIS — E1351 Other specified diabetes mellitus with diabetic peripheral angiopathy without gangrene: Secondary | ICD-10-CM | POA: Diagnosis not present

## 2016-08-25 DIAGNOSIS — E119 Type 2 diabetes mellitus without complications: Secondary | ICD-10-CM | POA: Diagnosis not present

## 2016-08-25 DIAGNOSIS — E7800 Pure hypercholesterolemia, unspecified: Secondary | ICD-10-CM | POA: Diagnosis not present

## 2016-09-01 DIAGNOSIS — Z0000 Encounter for general adult medical examination without abnormal findings: Secondary | ICD-10-CM | POA: Diagnosis not present

## 2016-09-01 DIAGNOSIS — I2510 Atherosclerotic heart disease of native coronary artery without angina pectoris: Secondary | ICD-10-CM | POA: Diagnosis not present

## 2016-09-01 DIAGNOSIS — E7800 Pure hypercholesterolemia, unspecified: Secondary | ICD-10-CM | POA: Diagnosis not present

## 2016-09-01 DIAGNOSIS — I10 Essential (primary) hypertension: Secondary | ICD-10-CM | POA: Diagnosis not present

## 2016-09-01 DIAGNOSIS — Z5181 Encounter for therapeutic drug level monitoring: Secondary | ICD-10-CM | POA: Diagnosis not present

## 2016-09-01 DIAGNOSIS — E119 Type 2 diabetes mellitus without complications: Secondary | ICD-10-CM | POA: Diagnosis not present

## 2016-09-01 DIAGNOSIS — F419 Anxiety disorder, unspecified: Secondary | ICD-10-CM | POA: Diagnosis not present

## 2016-09-29 DIAGNOSIS — M21962 Unspecified acquired deformity of left lower leg: Secondary | ICD-10-CM | POA: Diagnosis not present

## 2016-09-29 DIAGNOSIS — M7752 Other enthesopathy of left foot: Secondary | ICD-10-CM | POA: Diagnosis not present

## 2016-09-29 DIAGNOSIS — E1351 Other specified diabetes mellitus with diabetic peripheral angiopathy without gangrene: Secondary | ICD-10-CM | POA: Diagnosis not present

## 2016-09-29 DIAGNOSIS — L602 Onychogryphosis: Secondary | ICD-10-CM | POA: Diagnosis not present

## 2016-09-29 DIAGNOSIS — M205X2 Other deformities of toe(s) (acquired), left foot: Secondary | ICD-10-CM | POA: Diagnosis not present

## 2016-09-29 DIAGNOSIS — M21961 Unspecified acquired deformity of right lower leg: Secondary | ICD-10-CM | POA: Diagnosis not present

## 2016-10-06 DIAGNOSIS — G629 Polyneuropathy, unspecified: Secondary | ICD-10-CM | POA: Diagnosis not present

## 2016-10-06 DIAGNOSIS — G603 Idiopathic progressive neuropathy: Secondary | ICD-10-CM | POA: Diagnosis not present

## 2016-10-17 DIAGNOSIS — R202 Paresthesia of skin: Secondary | ICD-10-CM | POA: Diagnosis not present

## 2016-11-02 DIAGNOSIS — G609 Hereditary and idiopathic neuropathy, unspecified: Secondary | ICD-10-CM | POA: Diagnosis not present

## 2016-11-02 DIAGNOSIS — M21962 Unspecified acquired deformity of left lower leg: Secondary | ICD-10-CM | POA: Diagnosis not present

## 2016-11-02 DIAGNOSIS — M21961 Unspecified acquired deformity of right lower leg: Secondary | ICD-10-CM | POA: Diagnosis not present

## 2016-11-02 DIAGNOSIS — M205X2 Other deformities of toe(s) (acquired), left foot: Secondary | ICD-10-CM | POA: Diagnosis not present

## 2016-12-08 DIAGNOSIS — E119 Type 2 diabetes mellitus without complications: Secondary | ICD-10-CM | POA: Diagnosis not present

## 2016-12-08 DIAGNOSIS — F419 Anxiety disorder, unspecified: Secondary | ICD-10-CM | POA: Diagnosis not present

## 2016-12-08 DIAGNOSIS — Z5181 Encounter for therapeutic drug level monitoring: Secondary | ICD-10-CM | POA: Diagnosis not present

## 2016-12-08 DIAGNOSIS — I10 Essential (primary) hypertension: Secondary | ICD-10-CM | POA: Diagnosis not present

## 2016-12-08 DIAGNOSIS — G629 Polyneuropathy, unspecified: Secondary | ICD-10-CM | POA: Diagnosis not present

## 2016-12-08 DIAGNOSIS — E291 Testicular hypofunction: Secondary | ICD-10-CM | POA: Diagnosis not present

## 2016-12-08 DIAGNOSIS — Z125 Encounter for screening for malignant neoplasm of prostate: Secondary | ICD-10-CM | POA: Diagnosis not present

## 2016-12-14 DIAGNOSIS — Z23 Encounter for immunization: Secondary | ICD-10-CM | POA: Diagnosis not present

## 2016-12-14 DIAGNOSIS — E119 Type 2 diabetes mellitus without complications: Secondary | ICD-10-CM | POA: Diagnosis not present

## 2016-12-14 DIAGNOSIS — E291 Testicular hypofunction: Secondary | ICD-10-CM | POA: Diagnosis not present

## 2016-12-14 DIAGNOSIS — I2510 Atherosclerotic heart disease of native coronary artery without angina pectoris: Secondary | ICD-10-CM | POA: Diagnosis not present

## 2016-12-14 DIAGNOSIS — I10 Essential (primary) hypertension: Secondary | ICD-10-CM | POA: Diagnosis not present

## 2017-01-03 DIAGNOSIS — M25461 Effusion, right knee: Secondary | ICD-10-CM | POA: Diagnosis not present

## 2017-01-03 DIAGNOSIS — M25561 Pain in right knee: Secondary | ICD-10-CM | POA: Diagnosis not present

## 2017-01-03 DIAGNOSIS — M170 Bilateral primary osteoarthritis of knee: Secondary | ICD-10-CM | POA: Diagnosis not present

## 2017-01-24 DIAGNOSIS — M25569 Pain in unspecified knee: Secondary | ICD-10-CM | POA: Diagnosis not present

## 2017-01-24 DIAGNOSIS — M170 Bilateral primary osteoarthritis of knee: Secondary | ICD-10-CM | POA: Diagnosis not present

## 2017-03-13 DIAGNOSIS — I10 Essential (primary) hypertension: Secondary | ICD-10-CM | POA: Diagnosis not present

## 2017-03-13 DIAGNOSIS — E119 Type 2 diabetes mellitus without complications: Secondary | ICD-10-CM | POA: Diagnosis not present

## 2017-03-16 DIAGNOSIS — E119 Type 2 diabetes mellitus without complications: Secondary | ICD-10-CM | POA: Diagnosis not present

## 2017-03-16 DIAGNOSIS — E7800 Pure hypercholesterolemia, unspecified: Secondary | ICD-10-CM | POA: Diagnosis not present

## 2017-03-16 DIAGNOSIS — I2510 Atherosclerotic heart disease of native coronary artery without angina pectoris: Secondary | ICD-10-CM | POA: Diagnosis not present

## 2017-03-16 DIAGNOSIS — F419 Anxiety disorder, unspecified: Secondary | ICD-10-CM | POA: Diagnosis not present

## 2017-03-16 DIAGNOSIS — Z79899 Other long term (current) drug therapy: Secondary | ICD-10-CM | POA: Diagnosis not present

## 2017-03-16 DIAGNOSIS — I10 Essential (primary) hypertension: Secondary | ICD-10-CM | POA: Diagnosis not present

## 2017-06-15 DIAGNOSIS — Z79899 Other long term (current) drug therapy: Secondary | ICD-10-CM | POA: Diagnosis not present

## 2017-06-15 DIAGNOSIS — Z5181 Encounter for therapeutic drug level monitoring: Secondary | ICD-10-CM | POA: Diagnosis not present

## 2017-06-15 DIAGNOSIS — E119 Type 2 diabetes mellitus without complications: Secondary | ICD-10-CM | POA: Diagnosis not present

## 2017-06-15 DIAGNOSIS — I10 Essential (primary) hypertension: Secondary | ICD-10-CM | POA: Diagnosis not present

## 2017-06-30 DIAGNOSIS — R2681 Unsteadiness on feet: Secondary | ICD-10-CM | POA: Diagnosis not present

## 2017-06-30 DIAGNOSIS — Z79899 Other long term (current) drug therapy: Secondary | ICD-10-CM | POA: Diagnosis not present

## 2017-06-30 DIAGNOSIS — E291 Testicular hypofunction: Secondary | ICD-10-CM | POA: Diagnosis not present

## 2017-06-30 DIAGNOSIS — I2510 Atherosclerotic heart disease of native coronary artery without angina pectoris: Secondary | ICD-10-CM | POA: Diagnosis not present

## 2017-06-30 DIAGNOSIS — E119 Type 2 diabetes mellitus without complications: Secondary | ICD-10-CM | POA: Diagnosis not present

## 2017-06-30 DIAGNOSIS — M1990 Unspecified osteoarthritis, unspecified site: Secondary | ICD-10-CM | POA: Diagnosis not present

## 2017-06-30 DIAGNOSIS — I10 Essential (primary) hypertension: Secondary | ICD-10-CM | POA: Diagnosis not present

## 2017-07-03 DIAGNOSIS — M25512 Pain in left shoulder: Secondary | ICD-10-CM | POA: Diagnosis not present

## 2017-07-03 DIAGNOSIS — M7582 Other shoulder lesions, left shoulder: Secondary | ICD-10-CM | POA: Diagnosis not present

## 2017-07-03 DIAGNOSIS — M170 Bilateral primary osteoarthritis of knee: Secondary | ICD-10-CM | POA: Diagnosis not present

## 2017-07-03 DIAGNOSIS — M25569 Pain in unspecified knee: Secondary | ICD-10-CM | POA: Diagnosis not present

## 2017-07-12 DIAGNOSIS — R260 Ataxic gait: Secondary | ICD-10-CM | POA: Diagnosis not present

## 2017-07-19 ENCOUNTER — Emergency Department (HOSPITAL_COMMUNITY)
Admission: EM | Admit: 2017-07-19 | Discharge: 2017-07-20 | Disposition: A | Discharge status: Home / Self Care | Payer: Medicare Other | Attending: Emergency Medicine | Admitting: Emergency Medicine

## 2017-07-19 ENCOUNTER — Encounter (HOSPITAL_COMMUNITY): Payer: Self-pay | Admitting: Emergency Medicine

## 2017-07-19 ENCOUNTER — Other Ambulatory Visit: Payer: Self-pay

## 2017-07-19 DIAGNOSIS — I959 Hypotension, unspecified: Secondary | ICD-10-CM | POA: Insufficient documentation

## 2017-07-19 DIAGNOSIS — E869 Volume depletion, unspecified: Secondary | ICD-10-CM

## 2017-07-19 DIAGNOSIS — R404 Transient alteration of awareness: Secondary | ICD-10-CM | POA: Diagnosis not present

## 2017-07-19 DIAGNOSIS — R42 Dizziness and giddiness: Secondary | ICD-10-CM | POA: Diagnosis not present

## 2017-07-19 DIAGNOSIS — I259 Chronic ischemic heart disease, unspecified: Secondary | ICD-10-CM | POA: Insufficient documentation

## 2017-07-19 DIAGNOSIS — Z7984 Long term (current) use of oral hypoglycemic drugs: Secondary | ICD-10-CM | POA: Insufficient documentation

## 2017-07-19 DIAGNOSIS — N179 Acute kidney failure, unspecified: Secondary | ICD-10-CM

## 2017-07-19 DIAGNOSIS — I10 Essential (primary) hypertension: Secondary | ICD-10-CM | POA: Diagnosis not present

## 2017-07-19 DIAGNOSIS — Z79899 Other long term (current) drug therapy: Secondary | ICD-10-CM | POA: Diagnosis not present

## 2017-07-19 DIAGNOSIS — E860 Dehydration: Secondary | ICD-10-CM | POA: Diagnosis not present

## 2017-07-19 DIAGNOSIS — Z87891 Personal history of nicotine dependence: Secondary | ICD-10-CM | POA: Insufficient documentation

## 2017-07-19 DIAGNOSIS — E119 Type 2 diabetes mellitus without complications: Secondary | ICD-10-CM | POA: Insufficient documentation

## 2017-07-19 LAB — I-STAT CHEM 8, ED
BUN: 34 mg/dL — ABNORMAL HIGH (ref 6–20)
CALCIUM ION: 1.21 mmol/L (ref 1.15–1.40)
CHLORIDE: 103 mmol/L (ref 101–111)
Creatinine, Ser: 1.4 mg/dL — ABNORMAL HIGH (ref 0.61–1.24)
GLUCOSE: 155 mg/dL — AB (ref 65–99)
HCT: 39 % (ref 39.0–52.0)
Hemoglobin: 13.3 g/dL (ref 13.0–17.0)
Potassium: 3.9 mmol/L (ref 3.5–5.1)
SODIUM: 139 mmol/L (ref 135–145)
TCO2: 26 mmol/L (ref 22–32)

## 2017-07-19 LAB — APTT: APTT: 31 s (ref 24–36)

## 2017-07-19 LAB — CBC
HCT: 39.7 % (ref 39.0–52.0)
HEMOGLOBIN: 13.5 g/dL (ref 13.0–17.0)
MCH: 31.7 pg (ref 26.0–34.0)
MCHC: 34 g/dL (ref 30.0–36.0)
MCV: 93.2 fL (ref 78.0–100.0)
Platelets: 198 10*3/uL (ref 150–400)
RBC: 4.26 MIL/uL (ref 4.22–5.81)
RDW: 13.1 % (ref 11.5–15.5)
WBC: 7.4 10*3/uL (ref 4.0–10.5)

## 2017-07-19 LAB — COMPREHENSIVE METABOLIC PANEL
ALBUMIN: 3.7 g/dL (ref 3.5–5.0)
ALK PHOS: 74 U/L (ref 38–126)
ALT: 14 U/L — AB (ref 17–63)
AST: 27 U/L (ref 15–41)
Anion gap: 11 (ref 5–15)
BILIRUBIN TOTAL: 0.7 mg/dL (ref 0.3–1.2)
BUN: 32 mg/dL — AB (ref 6–20)
CALCIUM: 9 mg/dL (ref 8.9–10.3)
CO2: 23 mmol/L (ref 22–32)
CREATININE: 1.56 mg/dL — AB (ref 0.61–1.24)
Chloride: 103 mmol/L (ref 101–111)
GFR calc Af Amer: 48 mL/min — ABNORMAL LOW (ref >60)
GFR, EST NON AFRICAN AMERICAN: 41 mL/min — AB (ref >60)
GLUCOSE: 158 mg/dL — AB (ref 65–99)
Potassium: 4 mmol/L (ref 3.5–5.1)
Sodium: 137 mmol/L (ref 135–145)
TOTAL PROTEIN: 6.6 g/dL (ref 6.5–8.1)

## 2017-07-19 LAB — DIFFERENTIAL
BASOS ABS: 0 10*3/uL (ref 0.0–0.1)
Basophils Relative: 1 %
Eosinophils Absolute: 0.1 10*3/uL (ref 0.0–0.7)
Eosinophils Relative: 1 %
LYMPHS ABS: 1.7 10*3/uL (ref 0.7–4.0)
LYMPHS PCT: 23 %
MONOS PCT: 9 %
Monocytes Absolute: 0.6 10*3/uL (ref 0.1–1.0)
NEUTROS PCT: 66 %
Neutro Abs: 5 10*3/uL (ref 1.7–7.7)

## 2017-07-19 LAB — PROTIME-INR
INR: 0.93
Prothrombin Time: 12.4 seconds (ref 11.4–15.2)

## 2017-07-19 LAB — I-STAT TROPONIN, ED
TROPONIN I, POC: 0.03 ng/mL (ref 0.00–0.08)
Troponin i, poc: 0.03 ng/mL (ref 0.00–0.08)

## 2017-07-19 LAB — CBG MONITORING, ED: Glucose-Capillary: 146 mg/dL — ABNORMAL HIGH (ref 65–99)

## 2017-07-19 MED ORDER — SODIUM CHLORIDE 0.9 % IV BOLUS (SEPSIS)
1000.0000 mL | Freq: Once | INTRAVENOUS | Status: AC
  Administered 2017-07-19: 1000 mL via INTRAVENOUS

## 2017-07-19 NOTE — ED Triage Notes (Signed)
[facility] states the pt is orthostatic positive. EMS reports pt had a  Standing BP of 90/p. EMS reports a 572ml bolus of NaCL.   Pt states he was last normal round 1630 when he was out with his wife. Pt reports bending over to put dog food in the food bowl and getting dizzy. Pt reports he is still "swimmy headed". Pt denies any pain.

## 2017-07-19 NOTE — ED Provider Notes (Signed)
[facility] EMERGENCY DEPARTMENT Provider Note  CSN: [id number] Arrival date & time: 07/19/17 1921  Chief Complaint(s) Dizziness and Hypotension  HPI Zachary Nolan is a 77 y.o. male   The history is provided by the patient.  Dizziness  Quality:  Lightheadedness Severity:  Moderate Onset quality:  Gradual Duration:  4 hours Timing:  Intermittent Progression:  Waxing and waning Chronicity:  Recurrent Context: standing up   Relieved by:  Being still and lying down Worsened by:  Standing up Associated symptoms: no blood in stool, no chest pain, no diarrhea, no nausea, no palpitations, no shortness of breath and no vomiting    Wife reports noting the patient's SBPs in the 37s ealier today. This occurred when he stood up.  Patient reports that he drinks coffee and tea frequently and does not drink plain water or hydrate in other ways.  He denies recent fevers or infections.  Denies any other physical complaints.   Past Medical History Past Medical History:  Diagnosis Date  . CAD (coronary artery disease)    a. LHC (7/11): Inf STEMI >>> inf AK, EF 35-40%, LAD 70-75%, mid CFX 70%, dist RCA 99% >>> PCI: 3.5 x 28 mm Promus DES to RCA;    . Diabetes mellitus    Non-insulin-dependent diabetes mellitus.   . DJD (degenerative joint disease)   . HTN (hypertension)   . Hx of cardiovascular stress test    a. Nuclear (3/14): Small inf defect - likely scar; no ischemia, EF 59%; LOW RISK;  b. Lexiscan Myoview (1/16): No ischemia, fixed inferior defect consistent with prior infarct versus diaphragmatic attenuation, EF 57%, Low Risk  . Hyperlipemia   . Ischemic cardiomyopathy    EF 35-40% at time of MI in 2011 >> improved to normal on Nuclear study in 2014  . OSA (obstructive sleep apnea) 09/10/2014   severe with AHI 68/hr  . Seasonal allergies    Patient Active Problem List   Diagnosis Date Noted  . Obesity 09/11/2014  . OSA (obstructive sleep apnea) 09/10/2014  .  Essential hypertension, benign 11/02/2010  . SINOATRIAL NODE DYSFUNCTION 03/26/2010  . HYPERCHOLESTEROLEMIA 02/23/2010  . CORONARY ATHEROSCLEROSIS NATIVE CORONARY ARTERY 02/23/2010   Home Medication(s) Prior to Admission medications   Medication Sig Start Date End Date Taking? Authorizing Provider  ALPRAZolam Duanne Moron) 0.5 MG tablet Take 0.5 mg by mouth daily.   Yes [provider]  Calcium Carbonate-Vitamin D (CALCIUM 600 + D PO) Take 1 tablet by mouth daily.    Yes [provider]  Coenzyme Q10 (CO Q 10 PO) Take 1 capsule by mouth daily.    Yes [provider]  glimepiride (AMARYL) 1 MG tablet Take 1 mg by mouth daily before breakfast.   Yes [provider]  hydrochlorothiazide (HYDRODIURIL) 25 MG tablet Take 25 mg by mouth daily.   Yes [provider]  lisinopril (PRINIVIL,ZESTRIL) 20 MG tablet Take 20 mg by mouth 2 (two) times daily.   Yes [provider]  metFORMIN (GLUCOPHAGE) 500 MG tablet Take 500 mg by mouth 2 (two) times daily with a meal.    Yes [provider]  Multiple Vitamin (MULTIVITAMIN) capsule Take 1 capsule by mouth daily.     Yes [provider]  nitroGLYCERIN (NITROSTAT) 0.4 MG SL tablet Place 0.4 mg under the tongue every 5 (five) minutes as needed for chest pain.   Yes [provider]  Omega-3 Fatty Acids (FISH OIL) 1000 MG CAPS Take 1 capsule by mouth 2 (  two) times daily.    Yes [provider]  omeprazole (PRILOSEC) 20 MG capsule Take 20 mg by mouth daily.   Yes [provider]  pioglitazone (ACTOS) 15 MG tablet Take 15 mg by mouth daily.   Yes [provider]  VENTOLIN HFA 108 (90 Base) MCG/ACT inhaler Inhale 2 puffs into the lungs every 4 (four) hours as needed. Wheezing or shortness of breath 08/27/15  Yes [provider]                                                                                                                                    Past  Surgical History Past Surgical History:  Procedure Laterality Date  . Percutaneous coronary intervention using a drug-eluting stent (Promus)     Moderately    severe left anterior descending stenosis.  Moderate left     circumflex stenosis, moderate left ventricular dysfunction with   left  ventricular ejection fraction of 35% to 40%.   . Release of left transcarpal ligament.    . Release of right transcarpal ligament.     Family History Family History  Problem Relation Age of Onset  . Hypertension Mother   . Heart attack Father   . Hypertension Father   . Coronary artery disease Unknown   . Heart attack Brother   . Hypertension Brother   . Stroke Neg Hx     Social History Social History   Tobacco Use  . Smoking status: Former Research scientist (life sciences)  . Smokeless tobacco: Never Used  Substance Use Topics  . Alcohol use: No  . Drug use: No   Allergies Dust mite extract; Pollen extract; Shellfish-derived products; and Statins  Review of Systems Review of Systems  Respiratory: Negative for shortness of breath.   Cardiovascular: Negative for chest pain and palpitations.  Gastrointestinal: Negative for blood in stool, diarrhea, nausea and vomiting.  Neurological: Positive for dizziness.   All other systems are reviewed and are negative for acute change except as noted in the HPI  Physical Exam Vital Signs  I have reviewed the triage vital signs BP 121/72 (BP Location: Right Arm)   Pulse 90   Temp 97.8 F (36.6 C)   Resp 16   Ht 5\' 9"  (1.753 m)   Wt 93 kg (205 lb)   SpO2 97%   BMI 30.27 kg/m   Physical Exam  Constitutional: He is oriented to person, place, and time. He appears well-developed and well-nourished. No distress.  HENT:  Head: Normocephalic and atraumatic.  Nose: Nose normal.  Eyes: Conjunctivae and EOM are normal. Pupils are equal, round, and reactive to light. Right eye exhibits no discharge. Left eye exhibits no discharge. No scleral icterus.  Neck: Normal range  of motion. Neck supple.  Cardiovascular: Normal rate and regular rhythm. Exam reveals no gallop and no friction rub.  No murmur heard. Pulmonary/Chest: Effort normal and breath sounds normal. No stridor. No  respiratory distress. He has no rales.  Abdominal: Soft. He exhibits no distension. There is no tenderness.  Musculoskeletal: He exhibits no edema or tenderness.  Neurological: He is alert and oriented to person, place, and time.  Skin: Skin is warm and dry. No rash noted. He is not diaphoretic. No erythema.  Psychiatric: He has a normal mood and affect.  Vitals reviewed.   ED Results and Treatments Labs (all labs ordered are listed, but only abnormal results are displayed) Labs Reviewed  COMPREHENSIVE METABOLIC PANEL - Abnormal; Notable for the following components:      Result Value   Glucose, Bld 158 (*)    BUN 32 (*)    Creatinine, Ser 1.56 (*)    ALT 14 (*)    GFR calc non Af Amer 41 (*)    GFR calc Af Amer 48 (*)    All other components within normal limits  CBG MONITORING, ED - Abnormal; Notable for the following components:   Glucose-Capillary 146 (*)    All other components within normal limits  I-STAT CHEM 8, ED - Abnormal; Notable for the following components:   BUN 34 (*)    Creatinine, Ser 1.40 (*)    Glucose, Bld 155 (*)    All other components within normal limits  PROTIME-INR  APTT  CBC  DIFFERENTIAL  I-STAT TROPONIN, ED  I-STAT TROPONIN, ED                                                                                                                         EKG  EKG Interpretation  Date/Time:  Wednesday July 19 2017 19:32:37 EST Ventricular Rate:  102 PR Interval:    QRS Duration: 93 QT Interval:  337 QTC Calculation: 420 R Axis:   -50 Text Interpretation:  Sinus tachycardia Multiple premature complexes, vent & supraven Borderline prolonged PR interval Low voltage, precordial leads Abnormal R-wave progression, late transition Otherwise no  significant change Confirmed by Addison Lank [phone]) on 07/19/2017 8:21:46 PM      Radiology No results found. Pertinent labs & imaging results that were available during my care of the patient were reviewed by me and considered in my medical decision making (see chart for details).  Medications Ordered in ED Medications  sodium chloride 0.9 % bolus 1,000 mL (0 mLs Intravenous Stopped 07/19/17 2145)  Procedures Procedures  (including critical care time)  Medical Decision Making / ED Course I have reviewed the nursing notes for this encounter and the patient's prior records (if available in EHR or on provided paperwork).    EKG without acute ischemic changes or evidence of pericarditis.  Patient does have PVCs but no other dysrhythmias.  No other acute changes.  Troponins negative x2. No anemia on labs.  Labs consistent with acute renal injury likely secondary to volume depletion from lack of appropriate oral hydration.  His presentation is consistent with orthostasis likely secondary to volume depletion.  Provided with an additional liter of IV fluids.  Repeat orthostatics within normal limits and reassuring.  Patient now asymptomatic.  The patient appears reasonably screened and/or stabilized for discharge and I doubt any other medical condition or other [facility] requiring further screening, evaluation, or treatment in the ED at this time prior to discharge.  The patient is safe for discharge with strict return precautions.   Final Clinical Impression(s) / ED Diagnoses Final diagnoses:  Volume depletion  Dehydration  AKI (acute kidney injury) ([place])   Disposition: Discharge  Condition: Good  I have discussed the results, Dx and Tx plan with the patient who expressed understanding and agree(s) with the plan. Discharge instructions discussed at great  length. The patient was given strict return precautions who verbalized understanding of the instructions. No further questions at time of discharge.    ED Discharge Orders    None       Follow Up: Jani Gravel, [address] [place] [address] [phone]  Schedule an appointment as soon as possible for a visit  in 5-7 days, For close follow up to recheck your renal function.      This chart was dictated using voice recognition software.  Despite best efforts to proofread,  errors can occur which can change the documentation meaning.   Fatima Blank, MD 07/19/17 [phone]

## 2017-07-19 NOTE — ED Notes (Signed)
Pt states he was last normal round 1630 when he was out with his wife. Pt reports bending over to put dog food in the food bowl and getting dizzy. Pt reports he is still "swimmy headed". Pt denies any pain.

## 2017-07-24 DIAGNOSIS — N179 Acute kidney failure, unspecified: Secondary | ICD-10-CM | POA: Diagnosis not present

## 2017-07-26 DIAGNOSIS — R260 Ataxic gait: Secondary | ICD-10-CM | POA: Diagnosis not present

## 2017-07-27 DIAGNOSIS — N179 Acute kidney failure, unspecified: Secondary | ICD-10-CM | POA: Diagnosis not present

## 2017-07-27 DIAGNOSIS — N200 Calculus of kidney: Secondary | ICD-10-CM | POA: Diagnosis not present

## 2017-08-23 DIAGNOSIS — J449 Chronic obstructive pulmonary disease, unspecified: Secondary | ICD-10-CM | POA: Diagnosis not present

## 2017-08-23 DIAGNOSIS — E7800 Pure hypercholesterolemia, unspecified: Secondary | ICD-10-CM | POA: Diagnosis not present

## 2017-08-23 DIAGNOSIS — R05 Cough: Secondary | ICD-10-CM | POA: Diagnosis not present

## 2017-08-23 DIAGNOSIS — N179 Acute kidney failure, unspecified: Secondary | ICD-10-CM | POA: Diagnosis not present

## 2017-08-23 DIAGNOSIS — E1142 Type 2 diabetes mellitus with diabetic polyneuropathy: Secondary | ICD-10-CM | POA: Diagnosis not present

## 2017-08-23 DIAGNOSIS — Z79899 Other long term (current) drug therapy: Secondary | ICD-10-CM | POA: Diagnosis not present

## 2017-09-28 DIAGNOSIS — E291 Testicular hypofunction: Secondary | ICD-10-CM | POA: Diagnosis not present

## 2017-09-28 DIAGNOSIS — Z79899 Other long term (current) drug therapy: Secondary | ICD-10-CM | POA: Diagnosis not present

## 2017-09-28 DIAGNOSIS — Z5181 Encounter for therapeutic drug level monitoring: Secondary | ICD-10-CM | POA: Diagnosis not present

## 2017-09-28 DIAGNOSIS — E119 Type 2 diabetes mellitus without complications: Secondary | ICD-10-CM | POA: Diagnosis not present

## 2017-09-28 DIAGNOSIS — I10 Essential (primary) hypertension: Secondary | ICD-10-CM | POA: Diagnosis not present

## 2017-10-03 DIAGNOSIS — E1351 Other specified diabetes mellitus with diabetic peripheral angiopathy without gangrene: Secondary | ICD-10-CM | POA: Diagnosis not present

## 2017-10-03 DIAGNOSIS — M2042 Other hammer toe(s) (acquired), left foot: Secondary | ICD-10-CM | POA: Diagnosis not present

## 2017-10-03 DIAGNOSIS — M2041 Other hammer toe(s) (acquired), right foot: Secondary | ICD-10-CM | POA: Diagnosis not present

## 2017-10-05 DIAGNOSIS — I10 Essential (primary) hypertension: Secondary | ICD-10-CM | POA: Diagnosis not present

## 2017-10-05 DIAGNOSIS — E7800 Pure hypercholesterolemia, unspecified: Secondary | ICD-10-CM | POA: Diagnosis not present

## 2017-10-05 DIAGNOSIS — E119 Type 2 diabetes mellitus without complications: Secondary | ICD-10-CM | POA: Diagnosis not present

## 2017-10-16 DIAGNOSIS — J45909 Unspecified asthma, uncomplicated: Secondary | ICD-10-CM | POA: Diagnosis not present

## 2017-10-16 DIAGNOSIS — E119 Type 2 diabetes mellitus without complications: Secondary | ICD-10-CM | POA: Diagnosis not present

## 2017-10-16 DIAGNOSIS — E291 Testicular hypofunction: Secondary | ICD-10-CM | POA: Diagnosis not present

## 2017-10-16 DIAGNOSIS — N179 Acute kidney failure, unspecified: Secondary | ICD-10-CM | POA: Diagnosis not present

## 2017-10-16 DIAGNOSIS — Z79899 Other long term (current) drug therapy: Secondary | ICD-10-CM | POA: Diagnosis not present

## 2017-10-16 DIAGNOSIS — E7800 Pure hypercholesterolemia, unspecified: Secondary | ICD-10-CM | POA: Diagnosis not present

## 2017-10-16 DIAGNOSIS — Z23 Encounter for immunization: Secondary | ICD-10-CM | POA: Diagnosis not present

## 2017-10-16 DIAGNOSIS — I10 Essential (primary) hypertension: Secondary | ICD-10-CM | POA: Diagnosis not present

## 2017-10-27 DIAGNOSIS — H5203 Hypermetropia, bilateral: Secondary | ICD-10-CM | POA: Diagnosis not present

## 2017-10-27 DIAGNOSIS — E119 Type 2 diabetes mellitus without complications: Secondary | ICD-10-CM | POA: Diagnosis not present

## 2017-10-27 DIAGNOSIS — H2513 Age-related nuclear cataract, bilateral: Secondary | ICD-10-CM | POA: Diagnosis not present

## 2017-10-27 DIAGNOSIS — H524 Presbyopia: Secondary | ICD-10-CM | POA: Diagnosis not present

## 2017-10-27 DIAGNOSIS — Z7984 Long term (current) use of oral hypoglycemic drugs: Secondary | ICD-10-CM | POA: Diagnosis not present

## 2017-10-27 DIAGNOSIS — H52203 Unspecified astigmatism, bilateral: Secondary | ICD-10-CM | POA: Diagnosis not present

## 2018-01-08 DIAGNOSIS — Z5181 Encounter for therapeutic drug level monitoring: Secondary | ICD-10-CM | POA: Diagnosis not present

## 2018-01-08 DIAGNOSIS — Z79899 Other long term (current) drug therapy: Secondary | ICD-10-CM | POA: Diagnosis not present

## 2018-01-08 DIAGNOSIS — E291 Testicular hypofunction: Secondary | ICD-10-CM | POA: Diagnosis not present

## 2018-01-08 DIAGNOSIS — I10 Essential (primary) hypertension: Secondary | ICD-10-CM | POA: Diagnosis not present

## 2018-01-08 DIAGNOSIS — E119 Type 2 diabetes mellitus without complications: Secondary | ICD-10-CM | POA: Diagnosis not present

## 2018-01-15 DIAGNOSIS — E291 Testicular hypofunction: Secondary | ICD-10-CM | POA: Diagnosis not present

## 2018-01-15 DIAGNOSIS — I10 Essential (primary) hypertension: Secondary | ICD-10-CM | POA: Diagnosis not present

## 2018-01-15 DIAGNOSIS — R29898 Other symptoms and signs involving the musculoskeletal system: Secondary | ICD-10-CM | POA: Diagnosis not present

## 2018-01-15 DIAGNOSIS — E119 Type 2 diabetes mellitus without complications: Secondary | ICD-10-CM | POA: Diagnosis not present

## 2018-01-15 DIAGNOSIS — Z0000 Encounter for general adult medical examination without abnormal findings: Secondary | ICD-10-CM | POA: Diagnosis not present

## 2018-01-15 DIAGNOSIS — I2510 Atherosclerotic heart disease of native coronary artery without angina pectoris: Secondary | ICD-10-CM | POA: Diagnosis not present

## 2018-01-18 ENCOUNTER — Encounter: Payer: Self-pay | Admitting: Neurology

## 2018-01-19 ENCOUNTER — Ambulatory Visit (INDEPENDENT_AMBULATORY_CARE_PROVIDER_SITE_OTHER): Payer: Medicare Other | Admitting: Neurology

## 2018-01-19 ENCOUNTER — Encounter: Payer: Self-pay | Admitting: Neurology

## 2018-01-19 VITALS — BP 132/85 | HR 77 | Ht 69.0 in | Wt 195.0 lb

## 2018-01-19 DIAGNOSIS — E531 Pyridoxine deficiency: Secondary | ICD-10-CM | POA: Diagnosis not present

## 2018-01-19 DIAGNOSIS — E538 Deficiency of other specified B group vitamins: Secondary | ICD-10-CM | POA: Diagnosis not present

## 2018-01-19 DIAGNOSIS — R29898 Other symptoms and signs involving the musculoskeletal system: Secondary | ICD-10-CM | POA: Diagnosis not present

## 2018-01-19 DIAGNOSIS — G959 Disease of spinal cord, unspecified: Secondary | ICD-10-CM

## 2018-01-19 NOTE — Patient Instructions (Addendum)
Labs Imaginng of the spine (will review records from [place])   Electromyoneurogram Electromyoneurogram is a test to check how well your muscles and nerves are working. This procedure includes the combined use of electromyogram (EMG) and nerve conduction study (NCS). EMG is used to look for muscular disorders. NCS, which is also called electroneurogram, measures how well your nerves are controlling your muscles. The procedures are usually performed together to check if your muscles and nerves are healthy. If the reaction to testing is abnormal, this can indicate disease or injury, such as peripheral nerve damage. Tell a health care provider about:  Any allergies you have.  All medicines you are taking, including vitamins, herbs, eye drops, creams, and over-the-counter medicines.  Any problems you or family members have had with anesthetic medicines.  Any blood disorders you have.  Any surgeries you have had.  Any medical conditions you have.  Any pacemaker you have. What are the risks? Generally, this is a safe procedure. However, problems may occur, including:  Infection where the electrodes were inserted.  Bleeding.  What happens before the procedure?  Ask your health care provider about: ? Changing or stopping your regular medicines. This is especially important if you are taking diabetes medicines or blood thinners. ? Taking medicines such as aspirin and ibuprofen. These medicines can thin your blood. Do not take these medicines before your procedure if your health care provider instructs you not to.  Your health care provider may ask you to avoid: ? Caffeine, such as coffee and tea. ? Nicotine. This includes cigarettes and anything with tobacco.  Do not use lotions or creams on the same day that you will be having the procedure. What happens during the procedure? For EMG:  Your health care provider will ask you to stay in a position so that he or she can  access the muscle that will be studied. You may be standing, sitting down, or lying down.  You may be given a medicine that numbs the area (local anesthetic).  A very thin needle that has an electrode on it will be inserted into your muscle.  Another small electrode will be placed on your skin near the muscle.  Your health care provider will ask you to continue to remain still.  The electrodes will send a signal that tells about the electrical activity of your muscles. You may see this on a monitor or hear it in the room.  After your muscles have been studied at rest, your health care provider will ask you to contract or flex your muscles. The electrodes will send a signal that tells about the electrical activity of your muscles.  Your health care provider will remove the electrodes and the electrode needles when the procedure is finished. The procedure may vary among health care providers and hospitals. For NCS:  An electrode that records your nerve activity (recording electrode) will be placed on your skin by the muscle that is being studied.  An electrode that is used as a reference (reference electrode) will be placed near the recording electrode.  A paste or gel will be applied to your skin between the recording electrode and the reference electrode.  Your nerve will be stimulated with a mild shock. Your health care provider will measure how much time it takes for your muscle to react.  Your health care provider will remove the electrodes and the gel when the procedure is finished. The procedure may vary among health care providers and hospitals. What  happens after the procedure?  It is your responsibility to obtain your test results. Ask your health care provider or the department performing the test when and how you will get your results.  Your health care provider may: ? Give you medicines for any pain. ? Monitor the insertion sites to make sure that they stop  bleeding. This information is not intended to replace advice given to you by your health care provider. Make sure you discuss any questions you have with your health care provider. Document Released: 10/14/2004 Document Revised: 11/19/2015 Document Reviewed: 08/04/2014 Elsevier Interactive Patient Education  2018 [employer].

## 2018-01-19 NOTE — Progress Notes (Addendum)
[facility]    Provider:  Dr Jaynee Eagles Referring Provider: Jani Gravel, MD Primary Care Physician:  Jani Gravel, MD  CC:  Leg weakness  Update 03/01/2018: Patient had emg/ncs. Patient with low back pain which per notes started 50 years ago (at the age of 12) and leg weaknes that started more than 7 years ago.  Reviewed notes from [place], followed by Cindee Lame and Lovena Le, PA: Has been seen at [facility] in 2013 and 2015 with multiple diagnoses including lumbar spinal stenosis with claudication, back/ hip/knee osteoarthritis, diabetic polyneuropathy, per orthopaedic notes in 2015 " significant lumbar spondylosis L5-S1, L4-L5, endplate changes at S8-N4 as well as Schmorl's nodes at the inferior endplate of L4, L2, L1 and T12, also a chronic compression fracture at T11, central canal stenosis at L3-L4 which does not look severe and disc protrusion at L5-S1.".  He was provided with steroid injections and pain medications by Dr. Herma Mering at that time and being treated by orthopedics and improved.  After emg/ncs brought patient's wife into the room and reviewed all findings. 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 3.  L3-L4 4 mm right subarticular to lateral disc protrusion contacting the intrathecal right L4 nerve root without appreciable mass-effect.  There is also result in moderate right neuroforaminal stenosis without mass-effect on the exiting L3 nerve root. 4.  L4-L5 discovertebral osteophytosis and facet arthropathy resulting in moderate right neural foraminal stenosis. 5.  Chronic compression fracture T11 vertebral body and mild chronic anterior loss of height of the L1 vertebral body.  Reviewed lab results with patient and wife as well as  previous records and emg results with patient.  His walking difficulties are likely multifactorial due to significant degenerative changes in the lower spine, osteoarthritis of the lower spine, hips, knees as well as severe peripheral neuropathy likely diabetic.  Significant lab testing including acetylcholine receptor antibodies, rheumatoid factor, vitamin B1, Lyme antibodies, B12, folate, methylmalonic acid, B6, IFE and SPEP, CK, sed rate were all unremarkable except for abnormal protein of IgG monoclonal protein with kappa light chain specificity diagnosed with MGUS and following with hematology. Discussed checking imagig of the spine including MRI l-spine, no strong suggestion that this localizes to the brain but MRI of the brain is also something we can consider. They prefer to go back to [facility] for repeat MRI lumbar spine and possible intervention based on results.   Orders Placed This Encounter  Procedures  . Ambulatory referral to Orthopedic Surgery     HPI:  Zachary Nolan is a 77 y.o. male here as a referral from Dr. Maudie Mercury for leg weakness. PMHx CAD and cardiomyopathyD, bradycardia, severe OSA AHI 68/hr, HTN, DM, DJD. Problem started 3 years ago, he can;t walk any distance his legs "give ou", he has arthritis in his hips and knees and bad arthritis. Has checked the blood flow, he has seen cardiology, stopped statins, he can;t wall any distance. Some days are better. The legs get tired and the muscles hurt, he has to sit down and they quit hurting immediately. 2 years ago was seen by [place] and had MRI lumbar spine bc it sounded like spinal stenosis. It has worsened, "gotten so bad", he has to walk with a cane, no arm symptoms just the legs. Arms ar still  very strong can "pick up 100 pounds". Symmetric symptoms. Worsening. Feet are numb, he has neuropathy due to diabetes.  He has seldom mild back pain but nothing significant, no radicular symptoms. Sensory changes stable, not  worsening, it is the weakness. He is losing his balance but has come close to hitting the floor. No FHx neuromuscular dz. He has been to PT, orthopaedics, had testing with Dr. Maudie Mercury, went to the gym, nothing is helping the weakness and pain when he walks.   Reviewed notes, labs and imaging from outside physicians, which showed:  MRI lumbar spine did not show stenosis, will request records  Review of Systems: Patient complains of symptoms per HPI as well as the following symptoms: leg weakness. Pertinent negatives and positives per HPI. All others negative.   Social History   Socioeconomic History  . Marital status: Married    Spouse name: Not on file  . Number of children: Not on file  . Years of education: Not on file  . Highest education level: Not on file  Occupational History  . Not on file  Social Needs  . Financial resource strain: Not on file  . Food insecurity:    Worry: Not on file    Inability: Not on file  . Transportation needs:    Medical: Not on file    Non-medical: Not on file  Tobacco Use  . Smoking status: Former Smoker    Last attempt to quit: 1968    Years since quitting: 51.6  . Smokeless tobacco: Never Used  Substance and Sexual Activity  . Alcohol use: No  . Drug use: No  . Sexual activity: Not on file  Lifestyle  . Physical activity:    Days per week: Not on file    Minutes per session: Not on file  . Stress: Not on file  Relationships  . Social connections:    Talks on phone: Not on file    Gets together: Not on file    Attends religious service: Not on file    Active member of club or organization: Not on file    Attends meetings of clubs or organizations: Not on file    Relationship status: Not on file  . Intimate partner violence:    Fear of current or ex partner: Not on file    Emotionally abused: Not on file    Physically abused: Not on file    Forced sexual activity: Not on file  Other Topics Concern  . Not on file  Social History  Narrative  . Not on file    Family History  Problem Relation Age of Onset  . Hypertension Mother   . Asthma Mother   . Heart attack Father   . Hypertension Father   . Diabetes Father   . Coronary artery disease Unknown   . Heart attack Brother   . Hypertension Brother   . Stroke Neg Hx     Past Medical History:  Diagnosis Date  . CAD (coronary artery disease)    a. LHC (7/11): Inf STEMI >>> inf AK, EF 35-40%, LAD 70-75%, mid CFX 70%, dist RCA 99% >>> PCI: 3.5 x 28 mm Promus DES to RCA;    . Cancer ([place])    skin  . Depression   . Diabetes mellitus    Non-insulin-dependent diabetes mellitus.   . DJD (degenerative joint disease)   . HTN (hypertension)   . Hx of cardiovascular stress test    a. Nuclear (3/14): Small inf  defect - likely scar; no ischemia, EF 59%; LOW RISK;  b. Lexiscan Myoview (1/16): No ischemia, fixed inferior defect consistent with prior infarct versus diaphragmatic attenuation, EF 57%, Low Risk  . Hyperlipemia   . Ischemic cardiomyopathy    EF 35-40% at time of MI in 2011 >> improved to normal on Nuclear study in 2014  . OSA (obstructive sleep apnea) 09/10/2014   severe with AHI 68/hr  . RLS (restless legs syndrome)   . Seasonal allergies     Past Surgical History:  Procedure Laterality Date  . COLONOSCOPY    . NOSE SURGERY    . Percutaneous coronary intervention using a drug-eluting stent (Promus)     Moderately    severe left anterior descending stenosis.  Moderate left     circumflex stenosis, moderate left ventricular dysfunction with   left  ventricular ejection fraction of 35% to 40%.   . Release of left transcarpal ligament.    . Release of right transcarpal ligament.    . TONSILLECTOMY      Current Outpatient Medications  Medication Sig Dispense Refill  . ALPRAZolam (XANAX) 0.5 MG tablet Take 0.5 mg by mouth daily.    Marland Kitchen aspirin 325 MG tablet Take 325 mg by mouth daily.    . Calcium Carbonate-Vitamin D (CALCIUM 600 + D PO) Take 1 tablet  by mouth daily.     . Coenzyme Q10 (CO Q 10 PO) Take 1 capsule by mouth daily.     Marland Kitchen ezetimibe (ZETIA) 10 MG tablet Take 5-10 mg by mouth daily.    Marland Kitchen glimepiride (AMARYL) 2 MG tablet Take 2 mg by mouth daily before breakfast.     . hydrochlorothiazide (HYDRODIURIL) 25 MG tablet Take 25 mg by mouth daily.    Marland Kitchen L-Methylfolate-Algae-B12-B6 (METANX) 3-90.314-2-35 MG CAPS Take 1 capsule by mouth daily.    Marland Kitchen lisinopril (PRINIVIL,ZESTRIL) 20 MG tablet Take 20 mg by mouth 2 (two) times daily.    . metFORMIN (GLUCOPHAGE) 500 MG tablet Take 500 mg by mouth 2 (two) times daily with a meal.     . Multiple Vitamin (MULTIVITAMIN) capsule Take 1 capsule by mouth daily.      . nitroGLYCERIN (NITROSTAT) 0.4 MG SL tablet Place 0.4 mg under the tongue every 5 (five) minutes as needed for chest pain.    . Omega-3 Fatty Acids (FISH OIL) 1000 MG CAPS Take 1 capsule by mouth 2 (two) times daily.     Marland Kitchen omeprazole (PRILOSEC) 20 MG capsule Take 20 mg by mouth daily.    . pioglitazone (ACTOS) 15 MG tablet Take 15 mg by mouth daily.    . pravastatin (PRAVACHOL) 10 MG tablet 10 mg once a week.     . Psyllium (METAMUCIL) 30.9 % POWD Take by mouth daily.    . VENTOLIN HFA 108 (90 Base) MCG/ACT inhaler Inhale 2 puffs into the lungs every 4 (four) hours as needed. Wheezing or shortness of breath     No current facility-administered medications for this visit.     Allergies as of 01/19/2018 - Review Complete 01/19/2018  Allergen Reaction Noted  . Dust mite extract    . Pollen extract    . Shellfish-derived products Swelling   . Statins Other (See Comments) 10/13/2015    Vitals: BP 132/85   Pulse 77   Ht '5\' 9"'  (1.753 m)   Wt 195 lb (88.5 kg)   BMI 28.80 kg/m  Last Weight:  Wt Readings from Last 1 Encounters:  01/19/18 195 lb (  88.5 kg)   Last Height:   Ht Readings from Last 1 Encounters:  01/19/18 '5\' 9"'  (1.753 m)   Physical exam: Exam: Gen: NAD, conversant, well nourised, obese, well groomed                      CV: RRR, no MRG. No Carotid Bruits. No peripheral edema, warm, nontender Eyes: Conjunctivae clear without exudates or hemorrhage  Neuro: Detailed Neurologic Exam  Speech:    Speech is normal; fluent and spontaneous with normal comprehension.  Cognition:    The patient is oriented to person, place, and time;     recent and remote memory intact;     language fluent;     normal attention, concentration,     fund of knowledge Cranial Nerves:    The pupils are equal, round, and reactive to light. The fundi are normal and spontaneous venous pulsations are present. Visual fields are full to finger confrontation. Extraocular movements are intact. Trigeminal sensation is intact and the muscles of mastication are normal. The face is symmetric. The palate elevates in the midline. Hearing intact. Voice is normal. Shoulder shrug is normal. The tongue has normal motion without fasciculations.   Coordination:    Normal finger to nose and heel to shin. Normal rapid alternating movements.   Gait: cannot stand without using hands, normal stance, Slow but stable with cane, low clearance but not classically shuffling  Motor Observation:    No asymmetry, no atrophy, and no involuntary movements noted. Tone:    Normal muscle tone.    Posture:    Posture is stooped, decreased arm swing    Strength:    L hip flexion 3/5, right 3+/5 hip flexion otherwise 5/5.      Sensation: intact to LT     Reflex Exam:  DTR's:  Trace patellars, absent AJs, 1+ biceps   Toes:    The toes are downgoing bilaterally.   Clonus:    Clonus is absent.    Assessment/Plan 12/2017:  PMHx CAD and cardiomyopathyD, bradycardia, severe OSA AHI 68/hr, HTN, DM, DJD, significant hip and knee arthritis with difficulty ambulating, cannot walk very long without having to sit down, legs hurt. Proximal LE weakness and slow gait with cane otherwise 5/5. Could be multifactorial including severe arthritis and subsequent  deconditioning however need to rule out myopathies, causes of neurogenic claudication such as spinal stenosis, strokes.   Neurogenic claudication, progressive leg weakness: Need MRI lumbar spine,  thoracic and cervical spine Emg/ncs of bilateral lowers and one upper scheduled for September  - will try to reschedule to an earlier date if labwork and imaging negative   Update 03/01/2018: Patient had emg/ncs. Patient with low back pain which per notes started 50 years ago (at the age of 27) and leg weaknes that started more than 7 years ago.  Reviewed notes from [place], followed by Cindee Lame and Lovena Le, PA: Has been seen at [facility] in 2013 and 2015 with multiple diagnoses including lumbar spinal stenosis with claudication, back/ hip/knee osteoarthritis, diabetic polyneuropathy, per orthopaedic notes in 2015 " significant lumbar spondylosis L5-S1, L4-L5, endplate changes at H8-N2 as well as Schmorl's nodes at the inferior endplate of L4, L2, L1 and T12, also a chronic compression fracture at T11, central canal stenosis at L3-L4 which does not look severe and disc protrusion at L5-S1.".  He was provided with steroid injections and pain medications by Dr. Herma Mering at that time and being treated by orthopedics  and improved.  After emg/ncs brought patient's wife into the room and reviewed all findings. 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 3.  L3-L4 4 mm right subarticular to lateral disc protrusion contacting the intrathecal right L4 nerve root without appreciable mass-effect.  There is also result in moderate right neuroforaminal stenosis without mass-effect on the exiting L3 nerve root. 4.  L4-L5 discovertebral osteophytosis and facet arthropathy resulting in  moderate right neural foraminal stenosis. 5.  Chronic compression fracture T11 vertebral body and mild chronic anterior loss of height of the L1 vertebral body.  Reviewed lab results with patient and wife as well as previous records and emg results with patient.  His walking difficulties are likely multifactorial due to significant degenerative changes in the lower spine, osteoarthritis of the lower spine, hips, knees as well as severe peripheral neuropathy likely diabetic.  Significant lab testing including acetylcholine receptor antibodies, rheumatoid factor, vitamin B1, Lyme antibodies, B12, folate, methylmalonic acid, B6, IFE and SPEP, CK, sed rate were all unremarkable except for abnormal protein of IgG monoclonal protein with kappa light chain specificity diagnosed with MGUS and following with hematology. Discussed checking imagig of the spine including MRI l-spine, no strong suggestion that this localizes to the brain but MRI of the brain is also something we can consider. They prefer to go back to [facility] for repeat MRI lumbar spine and possible intervention based on results.   Orders Placed This Encounter  Procedures  . Ambulatory referral to Orthopedic Surgery      Orders Placed This Encounter  Procedures  . MR CERVICAL SPINE WO CONTRAST  . MR THORACIC SPINE WO CONTRAST  . Sedimentation rate  . Basic Metabolic Panel  . CK  . B12 and Folate Panel  . RPR  . Heavy metals, blood  . Vitamin B6  . Multiple Myeloma Panel (SPEP&IFE w/QIG)  . Methylmalonic acid, serum  . B. burgdorfi Antibody  . Vitamin B1  . Rheumatoid factor  . CYCLIC CITRUL PEPTIDE ANTIBODY, IGG/IGA  . Acetylcholine receptor, binding  . Acetylcholine receptor, blocking  . Acetylcholine receptor, modulating  . NCV with EMG(electromyography)     Sarina Ill, MD  [facility] [address] [place], [place] [address]  Phone [phone] Fax [phone]

## 2018-01-29 ENCOUNTER — Telehealth: Payer: Self-pay | Admitting: *Deleted

## 2018-01-29 DIAGNOSIS — D472 Monoclonal gammopathy: Secondary | ICD-10-CM

## 2018-01-29 NOTE — Telephone Encounter (Addendum)
Called pt and discussed his lab results showed an increase in normal component of blood. This may not mean anything but Dr. Jaynee Eagles wants him to be evaluated by hematology for MGUS. Monoclonal gammopathy of undetermined significance (MGUS) is a condition in which an abnormal protein - known as monoclonal protein or M protein - is in your blood. The protein is produced in a type of white blood cell (plasma cells) in your bone marrow. MGUS usually causes no problems. But sometimes it can progress over years to other disorders, including some forms of blood cancer. It's important to have regular checkups to closely and Dr. Jaynee Eagles would like to refer him to a hematologist to be checked and followed. If it doesn't progress it doesn't require treatment.  Pt verbalized agreement to referral and verbalized understanding. His questions were answered about his B vitamin levels. He verbalized appreciation. He is getting to go out of town. He is aware they will call him to setup and appt w/ hematology.   Order placed per Dr. Jaynee Eagles.   ----- Message from Melvenia Beam, MD sent at 01/25/2018 10:13 AM EDT ----- Still pending some results. But so far the major finding  in one lab we found an increase in a normal component of blood. This may not mean anything but I want him to be evaluated by hematology for MGUS. Monoclonal gammopathy of undetermined significance (MGUS) is a condition in which an abnormal protein - known as monoclonal protein or M protein - is in your blood. The protein is produced in a type of white blood cell (plasma cells) in your bone marrow. MGUS usually causes no problems. But sometimes it can progress over years to other disorders, including some forms of blood cancer. It's important to have regular checkups to closely monitor monoclonal gammopathy so that if it does progress, you get earlier treatment. If there's no disease progression, MGUS doesn't require treatment.  After you talk to patient please  place a referral to hematology for IgG monoclonal protein with kappa light chain specificity.  thanks.

## 2018-01-30 LAB — RPR: RPR Ser Ql: NONREACTIVE

## 2018-01-30 LAB — SEDIMENTATION RATE: Sed Rate: 20 mm/hr (ref 0–30)

## 2018-01-30 LAB — MULTIPLE MYELOMA PANEL, SERUM
ALBUMIN SERPL ELPH-MCNC: 4 g/dL (ref 2.9–4.4)
ALBUMIN/GLOB SERPL: 1.2 (ref 0.7–1.7)
Alpha 1: 0.2 g/dL (ref 0.0–0.4)
Alpha2 Glob SerPl Elph-Mcnc: 0.9 g/dL (ref 0.4–1.0)
B-GLOBULIN SERPL ELPH-MCNC: 1.1 g/dL (ref 0.7–1.3)
GAMMA GLOB SERPL ELPH-MCNC: 1.3 g/dL (ref 0.4–1.8)
GLOBULIN, TOTAL: 3.5 g/dL (ref 2.2–3.9)
IGG (IMMUNOGLOBIN G), SERUM: 1240 mg/dL (ref 700–1600)
IgA/Immunoglobulin A, Serum: 107 mg/dL (ref 61–437)
IgM (Immunoglobulin M), Srm: 109 mg/dL (ref 15–143)
M PROTEIN SERPL ELPH-MCNC: 0.9 g/dL — AB
Total Protein: 7.5 g/dL (ref 6.0–8.5)

## 2018-01-30 LAB — HEAVY METALS, BLOOD
Arsenic: 5 ug/L (ref 2–23)
Lead, Blood: NOT DETECTED ug/dL (ref 0–4)
Mercury: NOT DETECTED ug/L (ref 0.0–14.9)

## 2018-01-30 LAB — BASIC METABOLIC PANEL
BUN/Creatinine Ratio: 22 (ref 10–24)
BUN: 26 mg/dL (ref 8–27)
CALCIUM: 10.1 mg/dL (ref 8.6–10.2)
CO2: 23 mmol/L (ref 20–29)
Chloride: 100 mmol/L (ref 96–106)
Creatinine, Ser: 1.17 mg/dL (ref 0.76–1.27)
GFR calc Af Amer: 69 mL/min/{1.73_m2} (ref >59)
GFR, EST NON AFRICAN AMERICAN: 59 mL/min/{1.73_m2} — AB (ref >59)
Glucose: 153 mg/dL — ABNORMAL HIGH (ref 65–99)
POTASSIUM: 4.2 mmol/L (ref 3.5–5.2)
SODIUM: 139 mmol/L (ref 134–144)

## 2018-01-30 LAB — VITAMIN B1: THIAMINE: 144.8 nmol/L (ref 66.5–200.0)

## 2018-01-30 LAB — METHYLMALONIC ACID, SERUM: Methylmalonic Acid: 199 nmol/L (ref 0–378)

## 2018-01-30 LAB — ACETYLCHOLINE RECEPTOR, MODULATING: Acetylcholine Modulat Ab: <12 % (ref 0–20)

## 2018-01-30 LAB — ACETYLCHOLINE RECEPTOR, BLOCKING: Acetylchol Block Ab: 11 % (ref 0–25)

## 2018-01-30 LAB — ACETYLCHOLINE RECEPTOR, BINDING

## 2018-01-30 LAB — B. BURGDORFI ANTIBODIES: Lyme IgG/IgM Ab: <0.91 {ISR} (ref 0.00–0.90)

## 2018-01-30 LAB — B12 AND FOLATE PANEL

## 2018-01-30 LAB — CK: Total CK: 93 U/L (ref 24–204)

## 2018-01-30 LAB — VITAMIN B6: VITAMIN B6: 33 ug/L (ref 5.3–46.7)

## 2018-01-30 LAB — CYCLIC CITRUL PEPTIDE ANTIBODY, IGG/IGA: CYCLIC CITRULLIN PEPTIDE AB: 4 U (ref 0–19)

## 2018-01-30 LAB — RHEUMATOID FACTOR

## 2018-01-31 ENCOUNTER — Encounter: Payer: Self-pay | Admitting: Oncology

## 2018-01-31 ENCOUNTER — Telehealth: Payer: Self-pay | Admitting: Oncology

## 2018-01-31 NOTE — Telephone Encounter (Signed)
New hematology referral received from Dr. Jaynee Eagles for monoclonal gammopathy. Pt has been scheduled to see Dr. Alen Blew on 8/28 at 11am. Pt has agreed to the appt date and time. Letter mailed.

## 2018-02-21 ENCOUNTER — Ambulatory Visit (HOSPITAL_COMMUNITY)
Admission: RE | Admit: 2018-02-21 | Discharge: 2018-02-21 | Disposition: A | Discharge status: Home / Self Care | Payer: Medicare Other | Source: Ambulatory Visit | Attending: Oncology | Admitting: Oncology

## 2018-02-21 ENCOUNTER — Telehealth: Payer: Self-pay

## 2018-02-21 ENCOUNTER — Inpatient Hospital Stay: Payer: Medicare Other | Attending: Oncology | Admitting: Oncology

## 2018-02-21 VITALS — BP 155/62 | HR 61 | Temp 97.8°F | Resp 18 | Ht 69.0 in | Wt 199.9 lb

## 2018-02-21 DIAGNOSIS — E119 Type 2 diabetes mellitus without complications: Secondary | ICD-10-CM | POA: Diagnosis not present

## 2018-02-21 DIAGNOSIS — M4854XA Collapsed vertebra, not elsewhere classified, thoracic region, initial encounter for fracture: Secondary | ICD-10-CM | POA: Diagnosis not present

## 2018-02-21 DIAGNOSIS — Z7982 Long term (current) use of aspirin: Secondary | ICD-10-CM | POA: Diagnosis not present

## 2018-02-21 DIAGNOSIS — Z87891 Personal history of nicotine dependence: Secondary | ICD-10-CM | POA: Diagnosis not present

## 2018-02-21 DIAGNOSIS — L989 Disorder of the skin and subcutaneous tissue, unspecified: Secondary | ICD-10-CM | POA: Insufficient documentation

## 2018-02-21 DIAGNOSIS — M6281 Muscle weakness (generalized): Secondary | ICD-10-CM

## 2018-02-21 DIAGNOSIS — I10 Essential (primary) hypertension: Secondary | ICD-10-CM

## 2018-02-21 DIAGNOSIS — D169 Benign neoplasm of bone and articular cartilage, unspecified: Secondary | ICD-10-CM | POA: Diagnosis not present

## 2018-02-21 DIAGNOSIS — Z79899 Other long term (current) drug therapy: Secondary | ICD-10-CM | POA: Insufficient documentation

## 2018-02-21 DIAGNOSIS — Z7984 Long term (current) use of oral hypoglycemic drugs: Secondary | ICD-10-CM | POA: Diagnosis not present

## 2018-02-21 DIAGNOSIS — M8580 Other specified disorders of bone density and structure, unspecified site: Secondary | ICD-10-CM | POA: Insufficient documentation

## 2018-02-21 DIAGNOSIS — D472 Monoclonal gammopathy: Secondary | ICD-10-CM | POA: Insufficient documentation

## 2018-02-21 DIAGNOSIS — I252 Old myocardial infarction: Secondary | ICD-10-CM | POA: Diagnosis not present

## 2018-02-21 NOTE — Telephone Encounter (Signed)
Printed  avs and calender of upcoming appointment. Per 8/28 los 

## 2018-02-21 NOTE — Progress Notes (Signed)
Reason for the request:   Monoclonal gammopathy  HPI: I was asked by Dr. Jaynee Eagles to evaluate Zachary Nolan for monoclonal gammopathy.  He is a 77 year old man currently of [place] where he lived majority of his life.  He has history of hypertension, diabetes and coronary disease.  He has also degenerative arthritis and difficulties ambulating and muscle pain.  He was referred by Dr. Maudie Mercury to Dr. Jaynee Eagles for neurological evaluation regarding weakness in his lower extremities.  Part of his work-up, he underwent serum protein electrophoresis obtained on January 19, 2018 which showed a an M spike of 0.9 g/dL.  His IgG level was normal at 1240 with kappa light chain specificity.  The remaining laboratory data were within normal range.  His creatinine was 1.1 with normal electrolytes.  His vitamin B12 level was normal.  In January 2019 his CBC was also normal.  Clinically, he does report occasional dizziness and lightheadedness and generalized weakness with very little neuropathy in his lower extremities.  He denies any back pain, shoulder pain or hip pain that has changed for many years.  He does take low-dose aspirin as needed.  He denies any pathological fractures or recurrent infections.  He does not report any headaches, blurry vision, syncope or seizures. Does not report any fevers, chills or sweats.  Does not report any cough, wheezing or hemoptysis.  Does not report any chest pain, palpitation, orthopnea or leg edema.  Does not report any nausea, vomiting or abdominal pain.  Does not report any constipation or diarrhea.  Does not report any skeletal complaints.    Does not report frequency, urgency or hematuria.  Does not report any skin rashes or lesions. Does not report any heat or cold intolerance.  Does not report any lymphadenopathy or petechiae.  Does not report any anxiety or depression.  Remaining review of systems is negative.    Past Medical History:  Diagnosis Date  . CAD (coronary artery disease)    a. LHC (7/11): Inf STEMI >>> inf AK, EF 35-40%, LAD 70-75%, mid CFX 70%, dist RCA 99% >>> PCI: 3.5 x 28 mm Promus DES to RCA;    . Cancer ([place])    skin  . Depression   . Diabetes mellitus    Non-insulin-dependent diabetes mellitus.   . DJD (degenerative joint disease)   . HTN (hypertension)   . Hx of cardiovascular stress test    a. Nuclear (3/14): Small inf defect - likely scar; no ischemia, EF 59%; LOW RISK;  b. Lexiscan Myoview (1/16): No ischemia, fixed inferior defect consistent with prior infarct versus diaphragmatic attenuation, EF 57%, Low Risk  . Hyperlipemia   . Ischemic cardiomyopathy    EF 35-40% at time of MI in 2011 >> improved to normal on Nuclear study in 2014  . OSA (obstructive sleep apnea) 09/10/2014   severe with AHI 68/hr  . RLS (restless legs syndrome)   . Seasonal allergies   :  Past Surgical History:  Procedure Laterality Date  . COLONOSCOPY    . NOSE SURGERY    . Percutaneous coronary intervention using a drug-eluting stent (Promus)     Moderately    severe left anterior descending stenosis.  Moderate left     circumflex stenosis, moderate left ventricular dysfunction with   left  ventricular ejection fraction of 35% to 40%.   . Release of left transcarpal ligament.    . Release of right transcarpal ligament.    . TONSILLECTOMY    :   Current  Outpatient Medications:  .  ALPRAZolam (XANAX) 0.5 MG tablet, Take 0.5 mg by mouth daily., Disp: , Rfl:  .  aspirin 325 MG tablet, Take 325 mg by mouth daily., Disp: , Rfl:  .  ezetimibe (ZETIA) 10 MG tablet, Take 5-10 mg by mouth daily., Disp: , Rfl:  .  glimepiride (AMARYL) 2 MG tablet, Take 2 mg by mouth daily before breakfast. , Disp: , Rfl:  .  hydrochlorothiazide (HYDRODIURIL) 25 MG tablet, Take 25 mg by mouth daily., Disp: , Rfl:  .  L-Methylfolate-Algae-B12-B6 (METANX) 3-90.314-2-35 MG CAPS, Take 1 capsule by mouth daily., Disp: , Rfl:  .  lisinopril (PRINIVIL,ZESTRIL) 20 MG tablet, Take 20 mg by mouth 2  (two) times daily., Disp: , Rfl:  .  metFORMIN (GLUCOPHAGE) 500 MG tablet, Take 500 mg by mouth 2 (two) times daily with a meal. , Disp: , Rfl:  .  Multiple Vitamin (MULTIVITAMIN) capsule, Take 1 capsule by mouth daily.  , Disp: , Rfl:  .  Omega-3 Fatty Acids (FISH OIL) 1000 MG CAPS, Take 1 capsule by mouth 2 (two) times daily. , Disp: , Rfl:  .  omeprazole (PRILOSEC) 20 MG capsule, Take 20 mg by mouth daily., Disp: , Rfl:  .  pioglitazone (ACTOS) 15 MG tablet, Take 15 mg by mouth daily., Disp: , Rfl:  .  VENTOLIN HFA 108 (90 Base) MCG/ACT inhaler, Inhale 2 puffs into the lungs every 4 (four) hours as needed. Wheezing or shortness of breath, Disp: , Rfl:  .  nitroGLYCERIN (NITROSTAT) 0.4 MG SL tablet, Place 0.4 mg under the tongue every 5 (five) minutes as needed for chest pain., Disp: , Rfl: :  Allergies  Allergen Reactions  . Dust Mite Extract     Stuffy head  . Pollen Extract     Stuffy head  . Shellfish-Derived Products Swelling    JUST CRAB MEAT  . Statins Other (See Comments)    Dizziness and myalgias  :  Family History  Problem Relation Age of Onset  . Hypertension Mother   . Asthma Mother   . Heart attack Father   . Hypertension Father   . Diabetes Father   . Coronary artery disease Unknown   . Heart attack Brother   . Hypertension Brother   . Stroke Neg Hx   :  Social History   Socioeconomic History  . Marital status: Married    Spouse name: Not on file  . Number of children: Not on file  . Years of education: Not on file  . Highest education level: Not on file  Occupational History  . Not on file  Social Needs  . Financial resource strain: Not on file  . Food insecurity:    Worry: Not on file    Inability: Not on file  . Transportation needs:    Medical: Not on file    Non-medical: Not on file  Tobacco Use  . Smoking status: Former Smoker    Last attempt to quit: 1968    Years since quitting: 51.6  . Smokeless tobacco: Never Used  Substance and  Sexual Activity  . Alcohol use: No  . Drug use: No  . Sexual activity: Not on file  Lifestyle  . Physical activity:    Days per week: Not on file    Minutes per session: Not on file  . Stress: Not on file  Relationships  . Social connections:    Talks on phone: Not on file    Gets together: Not  on file    Attends religious service: Not on file    Active member of club or organization: Not on file    Attends meetings of clubs or organizations: Not on file    Relationship status: Not on file  . Intimate partner violence:    Fear of current or ex partner: Not on file    Emotionally abused: Not on file    Physically abused: Not on file    Forced sexual activity: Not on file  Other Topics Concern  . Not on file  Social History Narrative  . Not on file  :  Pertinent items are noted in HPI.  Exam: Blood pressure (!) 155/62, pulse 61, temperature 97.8 F (36.6 C), temperature source Oral, resp. rate 18, height '5\' 9"'  (1.753 m), weight 199 lb 14.4 oz (90.7 kg), SpO2 99 %.  ECOG 1 General appearance: alert and cooperative appeared without distress. Head: atraumatic without any abnormalities. Eyes: conjunctivae/corneas clear. PERRL.  Sclera anicteric. Throat: lips, mucosa, and tongue normal; without oral thrush or ulcers. Resp: clear to auscultation bilaterally without rhonchi, wheezes or dullness to percussion. Cardio: regular rate and rhythm, S1, S2 normal, no murmur, click, rub or gallop GI: soft, non-tender; bowel sounds normal; no masses,  no organomegaly Skin: Skin color, texture, turgor normal. No rashes or lesions Lymph nodes: Cervical, supraclavicular, and axillary nodes normal. Neurologic: Grossly normal without any motor, sensory or deep tendon reflexes. Musculoskeletal: No joint deformity or effusion.  CBC    Component Value Date/Time   WBC 7.4 07/19/2017 1946   RBC 4.26 07/19/2017 1946   HGB 13.3 07/19/2017 1959   HCT 39.0 07/19/2017 1959   PLT 198 07/19/2017  1946   MCV 93.2 07/19/2017 1946   MCH 31.7 07/19/2017 1946   MCHC 34.0 07/19/2017 1946   RDW 13.1 07/19/2017 1946   LYMPHSABS 1.7 07/19/2017 1946   MONOABS 0.6 07/19/2017 1946   EOSABS 0.1 07/19/2017 1946   BASOSABS 0.0 07/19/2017 1946     Chemistry      Component Value Date/Time   NA 139 01/19/2018 0913   K 4.2 01/19/2018 0913   CL 100 01/19/2018 0913   CO2 23 01/19/2018 0913   BUN 26 01/19/2018 0913   CREATININE 1.17 01/19/2018 0913      Component Value Date/Time   CALCIUM 10.1 01/19/2018 0913   ALKPHOS 74 07/19/2017 1946   AST 27 07/19/2017 1946   ALT 14 (L) 07/19/2017 1946   BILITOT 0.7 07/19/2017 1946         Assessment and Plan:   77 year old man with the following:  1.  Monoclonal protein detected on a serum protein electrophoresis in July 2019.  His M spike was 0.9 g/dL with immunofixation showed IgG kappa subtype.  He has normal CBC, electrolytes and no evidence to suggest endorgan damage.  The differential diagnosis was reviewed today with the patient and his wife.  These findings likely represents monoclonal gammopathy of undetermined significance (MGUS).  Other consideration are considered less likely including amyloidosis, multiple myeloma or other plasma cell disorders.  Reactive findings could also be a possibility related to an autoimmune disorder.  From a management standpoint, I see no evidence to suggest multiple myeloma that requires treatment or further investigation.  I would like to update his skeletal survey to make sure there is no lytic bone lesions at this time.  I do not see need for a bone marrow biopsy.  I recommended periodic monitoring and 6 months and subsequently annually  after that.  If he develops worsening cytopenias, endorgan damage such as hypercalcemia, bone lesions then a bone marrow biopsy will be needed and potentially diagnosis of multiple myeloma may be made.  2.  Muscle weakness: Not related to his monoclonal protein findings.   This could be an element of autoimmune disorder that is causing both these findings.  He is currently undergoing evaluation for these issues.  3.  Follow-up: We will be in 6 months to repeat laboratory testing and he will obtain a skeletal survey in the immediate future.  30  minutes was spent with the patient face-to-face today.  More than 50% of time was dedicated to discuss the natural course of his disease, differential diagnosis and management options.   Thank you for the referral. A copy of this consult has been forwarded to the requesting physician.

## 2018-02-22 ENCOUNTER — Telehealth: Payer: Self-pay | Admitting: *Deleted

## 2018-02-22 NOTE — Telephone Encounter (Signed)
As noted below by Dr. Alen Blew, I informed patient that his Xray is normal. He verbalized understanding.

## 2018-02-22 NOTE — Telephone Encounter (Signed)
-----   Message from Wyatt Portela, MD sent at 02/22/2018  9:22 AM EDT ----- Please let him know the Xray is normal.

## 2018-03-01 ENCOUNTER — Ambulatory Visit (INDEPENDENT_AMBULATORY_CARE_PROVIDER_SITE_OTHER): Payer: Medicare Other | Admitting: Neurology

## 2018-03-01 DIAGNOSIS — M47816 Spondylosis without myelopathy or radiculopathy, lumbar region: Secondary | ICD-10-CM | POA: Diagnosis not present

## 2018-03-01 DIAGNOSIS — M48062 Spinal stenosis, lumbar region with neurogenic claudication: Secondary | ICD-10-CM | POA: Diagnosis not present

## 2018-03-01 DIAGNOSIS — Z0289 Encounter for other administrative examinations: Secondary | ICD-10-CM

## 2018-03-01 DIAGNOSIS — R29898 Other symptoms and signs involving the musculoskeletal system: Secondary | ICD-10-CM | POA: Diagnosis not present

## 2018-03-01 NOTE — Progress Notes (Signed)
Full Name: Zachary Nolan Gender: Male MRN #: [id number] Date of Birth: May 10, 2041    Visit Date: 03/01/18 09:08 Age: 77 Years 57 Months Old Examining Physician: Sarina Ill, MD  Referring Physician: Jaynee Eagles, MD  Summary: EMG nerve conduction study was performed in the bilateral lowers and right upper extremities   The right peroneal motor nerve showed reduced amplitude (0.4 mV, 2).  The left peroneal motor nerve showed reduced amplitude (0.5 mV, 2).  The right tibial motor nerve showed prolonged distal onset latency (7.3 ms, 5.8) and reduced amplitude (0.9 mV, 4). The left tibial motor nerve showed prolonged distal onset latency (7.9 ms, 5.8) and reduced amplitude (0.7 mV, 4).  The right radial sensory nerve showed a decreased amplitude (12 mV, 15).  The right sural sensory nerve showed no response.  Left sural sensory nerve showed no response.  The right superficial peroneal sensory nerve showed no response the left superficial peroneal sensory nerve showed no response.  The right median orthodromic sensory nerve showed prolonged distal peak latency (4.1 ms, 3.4) and reduced amplitude (5 V, 10).  The right tibial F wave was prolonged (69.7 ms, 56).  The left tibial F wave was prolonged (67.9 ms, 56).  All remaining nerves (as detailed in the following tables) were within normal limits.  EMG needle exam showed increased spontaneous activity of the right abductor pollicis (3+ positive sharp waves and 3+ fibs), reduced motor unit recruitment.  The right gastrocnemius muscle showed reduced motor unit recruitment.   Conclusion:  1.  There is electrophysiologic evidence for a length dependent, axonal, sensorimotor polyneuropathy. 2.  No suggestion of myopathy/myositis, neuromuscular disease, cervical or lumbar radiculopathy was seen on EMG needle exam.   Sarina Ill M.D.  [facility] [place], [place] [address] Tel: [phone] Fax: [phone]          [facility]    Nerve / Sites Muscle Latency Ref. Amplitude Ref. Rel Amp Segments Distance Velocity Ref. Area    ms ms mV mV %  cm m/s m/s mVms  R Median - APB     Wrist APB 4.2 ?4.4 4.6 ?4.0 100 Wrist - APB 7   13.1     Upper arm APB 8.6  4.5  98 Upper arm - Wrist 22 49 ?49 13.0  R Ulnar - ADM     Wrist ADM 2.6 ?3.3 8.2 ?6.0 100 Wrist - ADM 7   20.1     B.Elbow ADM 6.3  7.3  89.7 B.Elbow - Wrist 20 55 ?49 21.1     A.Elbow ADM 8.1  6.8  92.4 A.Elbow - B.Elbow 10 53 ?49 21.3         A.Elbow - Wrist      R Peroneal - EDB     Ankle EDB 5.8 ?6.5 0.4 ?2.0 100 Ankle - EDB 9   1.6     Fib head EDB 14.5  0.5  113 Fib head - Ankle 31 36 ?44 1.8     Pop fossa EDB 18.0  0.5  96.3 Pop fossa - Fib head 12 34 ?44 1.5         Pop fossa - Ankle      L Peroneal - EDB     Ankle EDB 6.5 ?6.5 0.5 ?2.0 100 Ankle - EDB 9   1.1     Fib head EDB 16.9  0.4  86.2 Fib head - Ankle 32 31 ?44 0.8  Pop fossa EDB 20.5  0.4  103 Pop fossa - Fib head 12 33 ?44 0.8         Pop fossa - Ankle      R Tibial - AH     Ankle AH 7.3 ?5.8 0.9 ?4.0 100 Ankle - AH 9   2.0     Pop fossa AH 19.9  0.7  71.4 Pop fossa - Ankle 41 33 ?41 1.4  L Tibial - AH     Ankle AH 7.9 ?5.8 0.7 ?4.0 100 Ankle - AH 9   2.1     Pop fossa AH 20.3  0.6  85.3 Pop fossa - Ankle 41 33 ?41 1.3                      SNC    Nerve / Sites Rec. Site Peak Lat Ref.  Amp Ref. Segments Distance    ms ms V V  cm  R Radial - Anatomical snuff box (Forearm)     Forearm Wrist 2.8 ?2.9 12 ?15 Forearm - Wrist 10  R Sural - Ankle (Calf)     Calf Ankle NR ?4.4 NR ?6 Calf - Ankle 14  L Sural - Ankle (Calf)     Calf Ankle NR ?4.4 NR ?6 Calf - Ankle 14  R Superficial peroneal - Ankle     Lat leg Ankle NR ?4.4 NR ?6 Lat leg - Ankle 14  L Superficial peroneal - Ankle     Lat leg Ankle NR ?4.4 NR ?6 Lat leg - Ankle 14  R Median - Orthodromic (Dig II, Mid palm)     Dig II Wrist 4.1 ?3.4 5 ?10 Dig II - Wrist 13  R Ulnar - Orthodromic, (Dig V, Mid palm)     Dig V  Wrist 3.0 ?3.1 5 ?5 Dig V - Wrist 76                   F  Wave    Nerve F Lat Ref.   ms ms  R Tibial - AH 69.7 ?56.0  L Tibial - AH 67.9 ?56.0  R Ulnar - ADM 31.8 ?32.0           EMG full       EMG Summary Table    Spontaneous MUAP Recruitment  Muscle IA Fib PSW Fasc Other Amp Dur. Poly Pattern  R. Iliopsoas Normal None None None _______ Normal Normal Normal Normal  R. Vastus medialis Normal None None None _______ Normal Normal Normal Normal  R. Tibialis anterior Normal None None None _______ Normal Normal Normal Normal  R. Gastrocnemius (Medial head) Normal None None None _______ Increased Increased Normal Reduced  R. Biceps femoris (long head) Normal None None None _______ Normal Normal Normal Normal  R. Gluteus medius Normal None None None _______ Normal Normal Normal Normal  R. Gluteus maximus Normal None None None _______ Normal Normal Normal Normal  R. Lumbar paraspinals (low) Normal None None None _______ Normal Normal Normal Normal  R. Abductor hallucis Normal 3+ 3+ None _______ Normal Normal Normal Reduced  R. Deltoid Normal None None None _______ Normal Normal Normal Normal  R. Biceps brachii Normal None None None _______ Normal Normal Normal Normal  R. Triceps brachii Normal None None None _______ Normal Normal Normal Normal  R. Opponens pollicis Normal None None None _______ Normal Normal Normal Normal  R. Pronator teres Normal None None None _______ Normal Normal Normal Normal  L.  Lumbar paraspinals (low) Normal None None None _______ Normal Normal Normal Normal

## 2018-03-01 NOTE — Progress Notes (Signed)
History: Patient with low back pain which per notes started 50 years ago (at the age of 68) and leg weaknes that started more than 7 years ago.  Reviewed notes from [place], followed by Cindee Lame and Lovena Le, PA: Has been seen at [facility] in 2013 and 2015 with multiple diagnoses including lumbar spinal stenosis with claudication, back/ hip/knee osteoarthritis, diabetic polyneuropathy, per orthopaedic notes in 2015 " significant lumbar spondylosis L5-S1, L4-L5, endplate changes at J8-H6 as well as Schmorl's nodes at the inferior endplate of L4, L2, L1 and T12, also a chronic compression fracture at T11, central canal stenosis at L3-L4 which does not look severe and disc protrusion at L5-S1.".  He was provided with steroid injections and pain medications by Dr. Herma Mering at that time and being treated by orthopedics and improved.  After emg/ncs brought patient's wife into the room and reviewed all findings. 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 3.  L3-L4 4 mm right subarticular to lateral disc protrusion contacting the intrathecal right L4 nerve root without appreciable mass-effect.  There is also result in moderate right neuroforaminal stenosis without mass-effect on the exiting L3 nerve root. 4.  L4-L5 discovertebral osteophytosis and facet arthropathy resulting in moderate right neural foraminal stenosis. 5.  Chronic compression fracture T11 vertebral body and mild chronic anterior loss of height of the L1 vertebral body.  Reviewed lab results with patient and wife as well as previous records and emg results with patient.  His walking difficulties are likely multifactorial due to significant degenerative changes in the lower spine, osteoarthritis of the lower  spine, hips, knees as well as severe peripheral neuropathy likely diabetic.  Significant lab testing including acetylcholine receptor antibodies, rheumatoid factor, vitamin B1, Lyme antibodies, B12, folate, methylmalonic acid, B6, IFE and SPEP, CK, sed rate were all unremarkable except for abnormal protein of IgG monoclonal protein with kappa light chain specificity diagnosed with MGUS and following with hematology. Discussed checking imagig of the spine including MRI l-spine, no strong suggestion that this localizes to the brain but MRI of the brain is also something we can consider. They prefer to go back to [facility] for repeat MRI lumbar spine and possible intervention based on results.   Orders Placed This Encounter  Procedures  . Ambulatory referral to Orthopedic Surgery   A total of 40 minutes was spent face-to-face with this patient. Over half this time was spent on counseling patient on the  1. Spinal stenosis of lumbar region with neurogenic claudication   2. Weakness of both lower extremities   3. Osteoarthritis of lumbar spine, unspecified spinal osteoarthritis complication status     diagnosis and different diagnostic and therapeutic options, counseling and coordination of care, risks ans benefits of management, compliance, or risk factor reduction and education.  This does not include time spent on emg/ncs.

## 2018-03-01 NOTE — Progress Notes (Signed)
See procedure note.

## 2018-03-13 NOTE — Procedures (Signed)
Full Name: Zachary Nolan Gender: Male MRN #: [id number] Date of Birth: 2040/11/15    Visit Date: 03/01/18 09:08 Age: 77 Years 54 Months Old Examining Physician: Sarina Ill, MD  Referring Physician: Jaynee Eagles, MD  Summary: EMG nerve conduction study was performed in the bilateral lowers and right upper extremities   The right peroneal motor nerve showed reduced amplitude (0.4 mV, 2).  The left peroneal motor nerve showed reduced amplitude (0.5 mV, 2).  The right tibial motor nerve showed prolonged distal onset latency (7.3 ms, 5.8) and reduced amplitude (0.9 mV, 4). The left tibial motor nerve showed prolonged distal onset latency (7.9 ms, 5.8) and reduced amplitude (0.7 mV, 4).  The right radial sensory nerve showed a decreased amplitude (12 mV, 15).  The right sural sensory nerve showed no response.  Left sural sensory nerve showed no response.  The right superficial peroneal sensory nerve showed no response the left superficial peroneal sensory nerve showed no response.  The right median orthodromic sensory nerve showed prolonged distal peak latency (4.1 ms, 3.4) and reduced amplitude (5 V, 10).  The right tibial F wave was prolonged (69.7 ms, 56).  The left tibial F wave was prolonged (67.9 ms, 56).  All remaining nerves (as detailed in the following tables) were within normal limits.  EMG needle exam showed increased spontaneous activity of the right abductor pollicis (3+ positive sharp waves and 3+ fibs), reduced motor unit recruitment.  The right gastrocnemius muscle showed reduced motor unit recruitment.   Conclusion:  1.  There is electrophysiologic evidence for a length dependent, axonal, sensorimotor polyneuropathy. 2.  No suggestion of myopathy/myositis, neuromuscular disease, cervical or lumbar radiculopathy was seen on EMG needle exam.   Sarina Ill M.D.  [facility] [place], [place] [address] Tel: [phone] Fax: [phone]          [facility]    Nerve / Sites Muscle Latency Ref. Amplitude Ref. Rel Amp Segments Distance Velocity Ref. Area    ms ms mV mV %  cm m/s m/s mVms  R Median - APB     Wrist APB 4.2 ?4.4 4.6 ?4.0 100 Wrist - APB 7   13.1     Upper arm APB 8.6  4.5  98 Upper arm - Wrist 22 49 ?49 13.0  R Ulnar - ADM     Wrist ADM 2.6 ?3.3 8.2 ?6.0 100 Wrist - ADM 7   20.1     B.Elbow ADM 6.3  7.3  89.7 B.Elbow - Wrist 20 55 ?49 21.1     A.Elbow ADM 8.1  6.8  92.4 A.Elbow - B.Elbow 10 53 ?49 21.3         A.Elbow - Wrist      R Peroneal - EDB     Ankle EDB 5.8 ?6.5 0.4 ?2.0 100 Ankle - EDB 9   1.6     Fib head EDB 14.5  0.5  113 Fib head - Ankle 31 36 ?44 1.8     Pop fossa EDB 18.0  0.5  96.3 Pop fossa - Fib head 12 34 ?44 1.5         Pop fossa - Ankle      L Peroneal - EDB     Ankle EDB 6.5 ?6.5 0.5 ?2.0 100 Ankle - EDB 9   1.1     Fib head EDB 16.9  0.4  86.2 Fib head - Ankle 32 31 ?44 0.8  Pop fossa EDB 20.5  0.4  103 Pop fossa - Fib head 12 33 ?44 0.8         Pop fossa - Ankle      R Tibial - AH     Ankle AH 7.3 ?5.8 0.9 ?4.0 100 Ankle - AH 9   2.0     Pop fossa AH 19.9  0.7  71.4 Pop fossa - Ankle 41 33 ?41 1.4  L Tibial - AH     Ankle AH 7.9 ?5.8 0.7 ?4.0 100 Ankle - AH 9   2.1     Pop fossa AH 20.3  0.6  85.3 Pop fossa - Ankle 41 33 ?41 1.3                      SNC    Nerve / Sites Rec. Site Peak Lat Ref.  Amp Ref. Segments Distance    ms ms V V  cm  R Radial - Anatomical snuff box (Forearm)     Forearm Wrist 2.8 ?2.9 12 ?15 Forearm - Wrist 10  R Sural - Ankle (Calf)     Calf Ankle NR ?4.4 NR ?6 Calf - Ankle 14  L Sural - Ankle (Calf)     Calf Ankle NR ?4.4 NR ?6 Calf - Ankle 14  R Superficial peroneal - Ankle     Lat leg Ankle NR ?4.4 NR ?6 Lat leg - Ankle 14  L Superficial peroneal - Ankle     Lat leg Ankle NR ?4.4 NR ?6 Lat leg - Ankle 14  R Median - Orthodromic (Dig II, Mid palm)     Dig II Wrist 4.1 ?3.4 5 ?10 Dig II - Wrist 13  R Ulnar - Orthodromic, (Dig V, Mid palm)     Dig V  Wrist 3.0 ?3.1 5 ?5 Dig V - Wrist 68                   F  Wave    Nerve F Lat Ref.   ms ms  R Tibial - AH 69.7 ?56.0  L Tibial - AH 67.9 ?56.0  R Ulnar - ADM 31.8 ?32.0           EMG full       EMG Summary Table    Spontaneous MUAP Recruitment  Muscle IA Fib PSW Fasc Other Amp Dur. Poly Pattern  R. Iliopsoas Normal None None None _______ Normal Normal Normal Normal  R. Vastus medialis Normal None None None _______ Normal Normal Normal Normal  R. Tibialis anterior Normal None None None _______ Normal Normal Normal Normal  R. Gastrocnemius (Medial head) Normal None None None _______ Increased Increased Normal Reduced  R. Biceps femoris (long head) Normal None None None _______ Normal Normal Normal Normal  R. Gluteus medius Normal None None None _______ Normal Normal Normal Normal  R. Gluteus maximus Normal None None None _______ Normal Normal Normal Normal  R. Lumbar paraspinals (low) Normal None None None _______ Normal Normal Normal Normal  R. Abductor hallucis Normal 3+ 3+ None _______ Normal Normal Normal Reduced  R. Deltoid Normal None None None _______ Normal Normal Normal Normal  R. Biceps brachii Normal None None None _______ Normal Normal Normal Normal  R. Triceps brachii Normal None None None _______ Normal Normal Normal Normal  R. Opponens pollicis Normal None None None _______ Normal Normal Normal Normal  R. Pronator teres Normal None None None _______ Normal Normal Normal Normal  L.  Lumbar paraspinals (low) Normal None None None _______ Normal Normal Normal Normal

## 2018-04-17 DIAGNOSIS — I10 Essential (primary) hypertension: Secondary | ICD-10-CM | POA: Diagnosis not present

## 2018-04-17 DIAGNOSIS — E119 Type 2 diabetes mellitus without complications: Secondary | ICD-10-CM | POA: Diagnosis not present

## 2018-04-17 DIAGNOSIS — E291 Testicular hypofunction: Secondary | ICD-10-CM | POA: Diagnosis not present

## 2018-04-24 DIAGNOSIS — E7800 Pure hypercholesterolemia, unspecified: Secondary | ICD-10-CM | POA: Diagnosis not present

## 2018-04-24 DIAGNOSIS — Z79899 Other long term (current) drug therapy: Secondary | ICD-10-CM | POA: Diagnosis not present

## 2018-04-24 DIAGNOSIS — I10 Essential (primary) hypertension: Secondary | ICD-10-CM | POA: Diagnosis not present

## 2018-04-24 DIAGNOSIS — Z23 Encounter for immunization: Secondary | ICD-10-CM | POA: Diagnosis not present

## 2018-04-24 DIAGNOSIS — I2510 Atherosclerotic heart disease of native coronary artery without angina pectoris: Secondary | ICD-10-CM | POA: Diagnosis not present

## 2018-04-24 DIAGNOSIS — E119 Type 2 diabetes mellitus without complications: Secondary | ICD-10-CM | POA: Diagnosis not present

## 2018-04-24 DIAGNOSIS — E291 Testicular hypofunction: Secondary | ICD-10-CM | POA: Diagnosis not present

## 2018-07-30 DIAGNOSIS — M25512 Pain in left shoulder: Secondary | ICD-10-CM | POA: Diagnosis not present

## 2018-07-30 DIAGNOSIS — M25511 Pain in right shoulder: Secondary | ICD-10-CM | POA: Diagnosis not present

## 2018-07-30 DIAGNOSIS — M19012 Primary osteoarthritis, left shoulder: Secondary | ICD-10-CM | POA: Diagnosis not present

## 2018-09-13 ENCOUNTER — Telehealth: Payer: Self-pay | Admitting: Oncology

## 2018-09-13 ENCOUNTER — Inpatient Hospital Stay: Payer: Medicare Other

## 2018-09-13 NOTE — Telephone Encounter (Signed)
R/s apt per 3/19 los - pt aware of new appt date and time

## 2018-09-20 ENCOUNTER — Ambulatory Visit: Payer: Medicare Other | Admitting: Oncology

## 2018-12-04 DIAGNOSIS — E7800 Pure hypercholesterolemia, unspecified: Secondary | ICD-10-CM | POA: Diagnosis not present

## 2018-12-04 DIAGNOSIS — E291 Testicular hypofunction: Secondary | ICD-10-CM | POA: Diagnosis not present

## 2018-12-04 DIAGNOSIS — Z5181 Encounter for therapeutic drug level monitoring: Secondary | ICD-10-CM | POA: Diagnosis not present

## 2018-12-04 DIAGNOSIS — F419 Anxiety disorder, unspecified: Secondary | ICD-10-CM | POA: Diagnosis not present

## 2018-12-04 DIAGNOSIS — G629 Polyneuropathy, unspecified: Secondary | ICD-10-CM | POA: Diagnosis not present

## 2018-12-04 DIAGNOSIS — Z79899 Other long term (current) drug therapy: Secondary | ICD-10-CM | POA: Diagnosis not present

## 2018-12-04 DIAGNOSIS — I10 Essential (primary) hypertension: Secondary | ICD-10-CM | POA: Diagnosis not present

## 2018-12-04 DIAGNOSIS — E1142 Type 2 diabetes mellitus with diabetic polyneuropathy: Secondary | ICD-10-CM | POA: Diagnosis not present

## 2019-01-31 DIAGNOSIS — I10 Essential (primary) hypertension: Secondary | ICD-10-CM | POA: Diagnosis not present

## 2019-01-31 DIAGNOSIS — Z79899 Other long term (current) drug therapy: Secondary | ICD-10-CM | POA: Diagnosis not present

## 2019-01-31 DIAGNOSIS — E291 Testicular hypofunction: Secondary | ICD-10-CM | POA: Diagnosis not present

## 2019-01-31 DIAGNOSIS — G629 Polyneuropathy, unspecified: Secondary | ICD-10-CM | POA: Diagnosis not present

## 2019-01-31 DIAGNOSIS — E7800 Pure hypercholesterolemia, unspecified: Secondary | ICD-10-CM | POA: Diagnosis not present

## 2019-01-31 DIAGNOSIS — E786 Lipoprotein deficiency: Secondary | ICD-10-CM | POA: Diagnosis not present

## 2019-01-31 DIAGNOSIS — E1142 Type 2 diabetes mellitus with diabetic polyneuropathy: Secondary | ICD-10-CM | POA: Diagnosis not present

## 2019-02-11 ENCOUNTER — Other Ambulatory Visit: Payer: Self-pay | Admitting: Dermatology

## 2019-02-11 DIAGNOSIS — L821 Other seborrheic keratosis: Secondary | ICD-10-CM | POA: Diagnosis not present

## 2019-02-11 DIAGNOSIS — D229 Melanocytic nevi, unspecified: Secondary | ICD-10-CM | POA: Diagnosis not present

## 2019-02-11 DIAGNOSIS — D0462 Carcinoma in situ of skin of left upper limb, including shoulder: Secondary | ICD-10-CM | POA: Diagnosis not present

## 2019-02-12 DIAGNOSIS — Z0000 Encounter for general adult medical examination without abnormal findings: Secondary | ICD-10-CM | POA: Diagnosis not present

## 2019-02-12 DIAGNOSIS — I10 Essential (primary) hypertension: Secondary | ICD-10-CM | POA: Diagnosis not present

## 2019-02-12 DIAGNOSIS — E7800 Pure hypercholesterolemia, unspecified: Secondary | ICD-10-CM | POA: Diagnosis not present

## 2019-02-12 DIAGNOSIS — E875 Hyperkalemia: Secondary | ICD-10-CM | POA: Diagnosis not present

## 2019-02-12 DIAGNOSIS — E119 Type 2 diabetes mellitus without complications: Secondary | ICD-10-CM | POA: Diagnosis not present

## 2019-02-12 DIAGNOSIS — J45909 Unspecified asthma, uncomplicated: Secondary | ICD-10-CM | POA: Diagnosis not present

## 2019-02-12 DIAGNOSIS — I739 Peripheral vascular disease, unspecified: Secondary | ICD-10-CM | POA: Diagnosis not present

## 2019-02-12 DIAGNOSIS — G629 Polyneuropathy, unspecified: Secondary | ICD-10-CM | POA: Diagnosis not present

## 2019-02-18 DIAGNOSIS — Z13820 Encounter for screening for osteoporosis: Secondary | ICD-10-CM | POA: Diagnosis not present

## 2019-02-18 DIAGNOSIS — J45909 Unspecified asthma, uncomplicated: Secondary | ICD-10-CM | POA: Diagnosis not present

## 2019-02-20 DIAGNOSIS — I10 Essential (primary) hypertension: Secondary | ICD-10-CM | POA: Diagnosis not present

## 2019-02-20 DIAGNOSIS — E7800 Pure hypercholesterolemia, unspecified: Secondary | ICD-10-CM | POA: Diagnosis not present

## 2019-02-20 DIAGNOSIS — E119 Type 2 diabetes mellitus without complications: Secondary | ICD-10-CM | POA: Diagnosis not present

## 2019-02-20 DIAGNOSIS — E1142 Type 2 diabetes mellitus with diabetic polyneuropathy: Secondary | ICD-10-CM | POA: Diagnosis not present

## 2019-03-14 ENCOUNTER — Telehealth: Payer: Self-pay | Admitting: Oncology

## 2019-03-14 NOTE — Telephone Encounter (Signed)
Returned patient's phone call regarding cancelling 09/18 and 09/25, per patient's request appointment has been cancelled. Patient will call back when ready to reschedule.

## 2019-03-15 ENCOUNTER — Inpatient Hospital Stay: Payer: Medicare Other

## 2019-03-21 DIAGNOSIS — M1612 Unilateral primary osteoarthritis, left hip: Secondary | ICD-10-CM | POA: Diagnosis not present

## 2019-03-21 DIAGNOSIS — M25552 Pain in left hip: Secondary | ICD-10-CM | POA: Diagnosis not present

## 2019-03-21 DIAGNOSIS — M79674 Pain in right toe(s): Secondary | ICD-10-CM | POA: Diagnosis not present

## 2019-03-21 DIAGNOSIS — Z23 Encounter for immunization: Secondary | ICD-10-CM | POA: Diagnosis not present

## 2019-03-21 DIAGNOSIS — M5410 Radiculopathy, site unspecified: Secondary | ICD-10-CM | POA: Diagnosis not present

## 2019-03-22 ENCOUNTER — Ambulatory Visit: Payer: Medicare Other | Admitting: Oncology

## 2019-03-25 DIAGNOSIS — M5126 Other intervertebral disc displacement, lumbar region: Secondary | ICD-10-CM | POA: Diagnosis not present

## 2019-03-25 DIAGNOSIS — M5127 Other intervertebral disc displacement, lumbosacral region: Secondary | ICD-10-CM | POA: Diagnosis not present

## 2019-03-25 DIAGNOSIS — M47816 Spondylosis without myelopathy or radiculopathy, lumbar region: Secondary | ICD-10-CM | POA: Diagnosis not present

## 2019-03-29 DIAGNOSIS — Z6830 Body mass index (BMI) 30.0-30.9, adult: Secondary | ICD-10-CM | POA: Diagnosis not present

## 2019-03-29 DIAGNOSIS — I10 Essential (primary) hypertension: Secondary | ICD-10-CM | POA: Diagnosis not present

## 2019-03-29 DIAGNOSIS — M549 Dorsalgia, unspecified: Secondary | ICD-10-CM | POA: Diagnosis not present

## 2019-04-26 DIAGNOSIS — M169 Osteoarthritis of hip, unspecified: Secondary | ICD-10-CM | POA: Diagnosis not present

## 2019-05-15 DIAGNOSIS — M25552 Pain in left hip: Secondary | ICD-10-CM | POA: Diagnosis not present

## 2019-06-04 DIAGNOSIS — M25511 Pain in right shoulder: Secondary | ICD-10-CM | POA: Diagnosis not present

## 2019-06-04 DIAGNOSIS — M1612 Unilateral primary osteoarthritis, left hip: Secondary | ICD-10-CM | POA: Diagnosis not present

## 2019-06-04 DIAGNOSIS — M25512 Pain in left shoulder: Secondary | ICD-10-CM | POA: Diagnosis not present

## 2019-06-04 DIAGNOSIS — M19012 Primary osteoarthritis, left shoulder: Secondary | ICD-10-CM | POA: Diagnosis not present

## 2019-07-01 DIAGNOSIS — I10 Essential (primary) hypertension: Secondary | ICD-10-CM | POA: Diagnosis not present

## 2019-07-01 DIAGNOSIS — E119 Type 2 diabetes mellitus without complications: Secondary | ICD-10-CM | POA: Diagnosis not present

## 2019-07-08 DIAGNOSIS — E7800 Pure hypercholesterolemia, unspecified: Secondary | ICD-10-CM | POA: Diagnosis not present

## 2019-07-08 DIAGNOSIS — I10 Essential (primary) hypertension: Secondary | ICD-10-CM | POA: Diagnosis not present

## 2019-07-08 DIAGNOSIS — E1142 Type 2 diabetes mellitus with diabetic polyneuropathy: Secondary | ICD-10-CM | POA: Diagnosis not present

## 2019-09-07 ENCOUNTER — Ambulatory Visit: Payer: Medicare Other

## 2019-09-17 IMAGING — DX DG BONE SURVEY MET
10 series · 10 of 10 positions shown · non-contrast
Comparison: None.

CLINICAL DATA: Monoclonal gammopathy of unknown significance. Rule
out myeloma lesion.

EXAM:
METASTATIC BONE SURVEY

[skull lat]
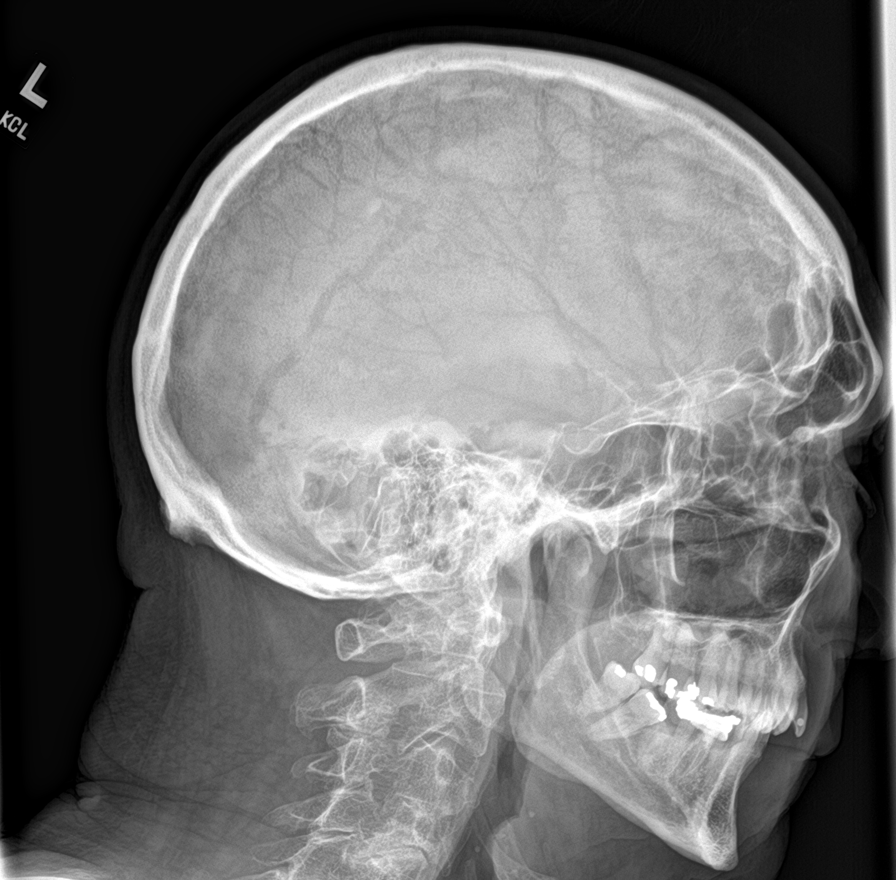

[shoulder ap (1 of 2)]
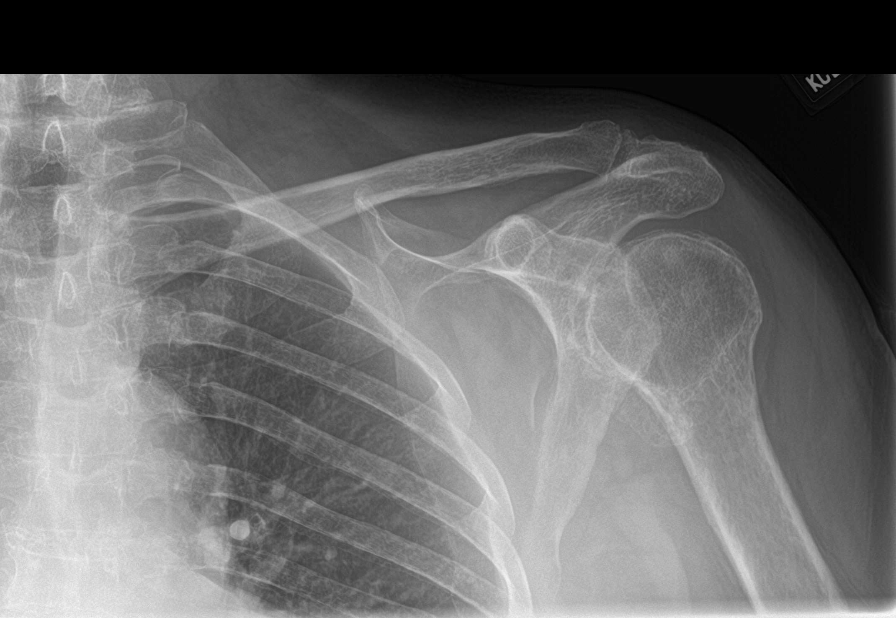

[shoulder ap (2 of 2)]
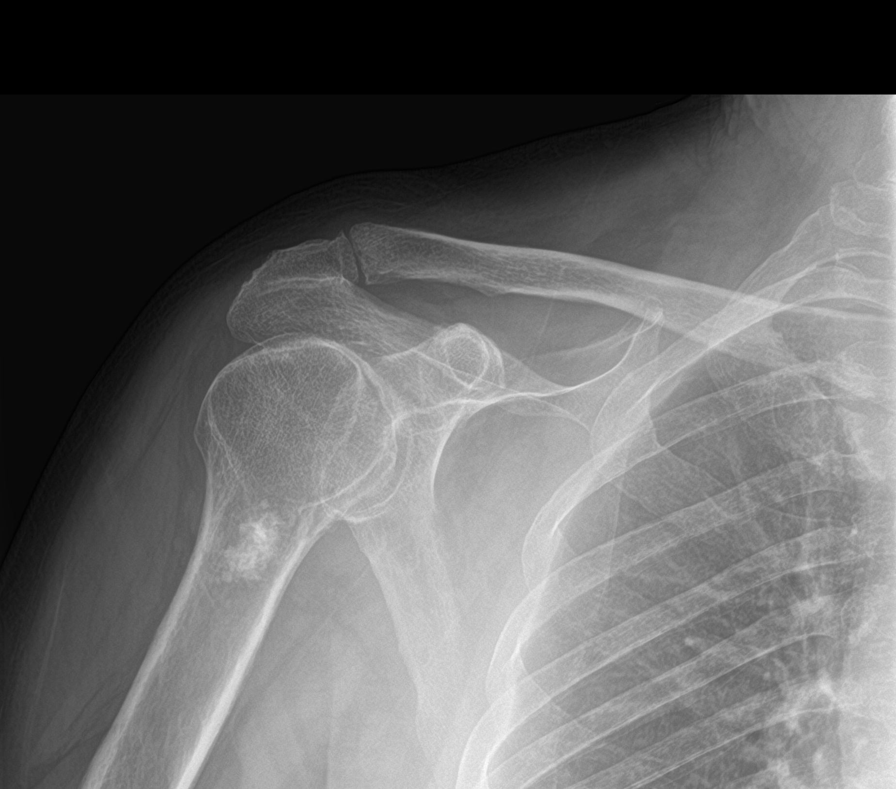

[humerus ap (1 of 2)]
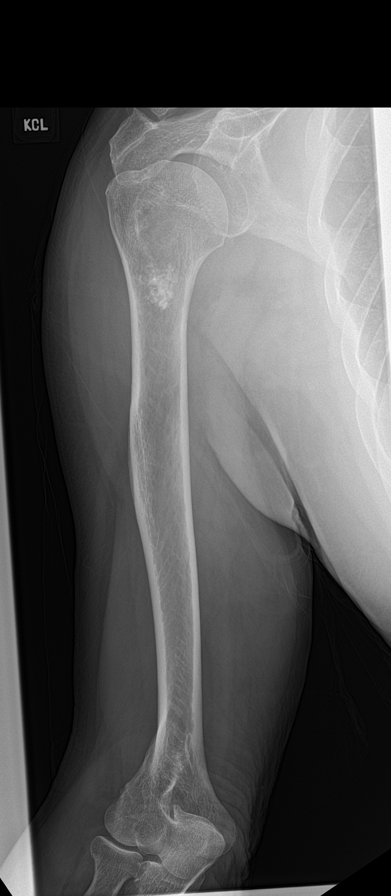

[humerus ap (2 of 2)]
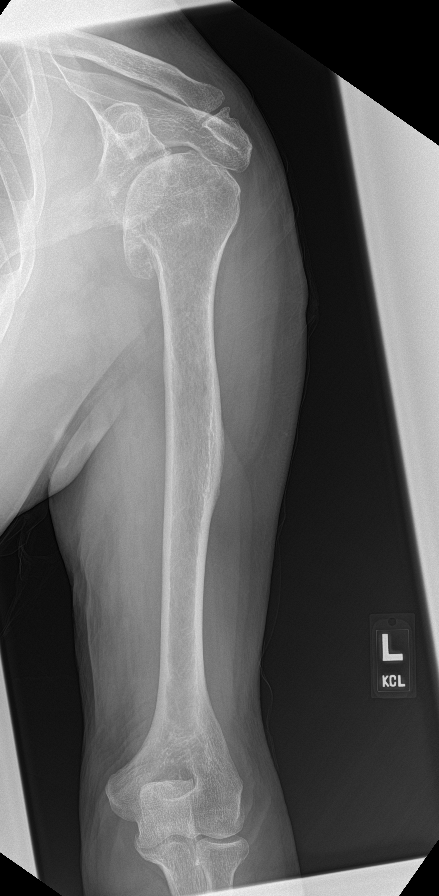

[forearm ap (1 of 2)]
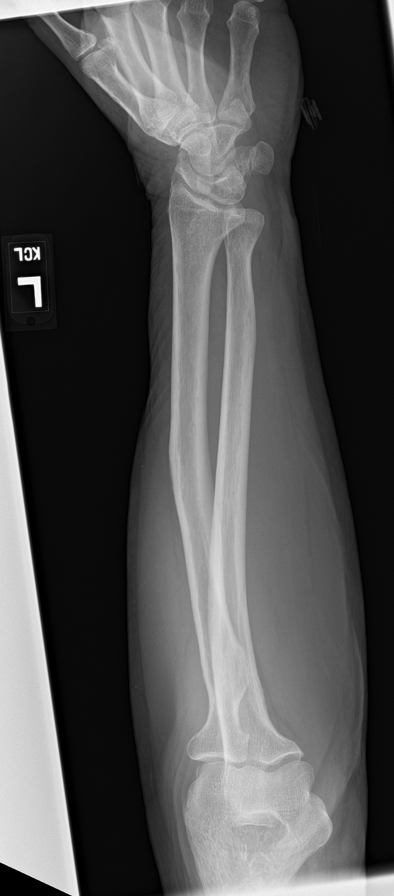

[forearm ap (2 of 2)]
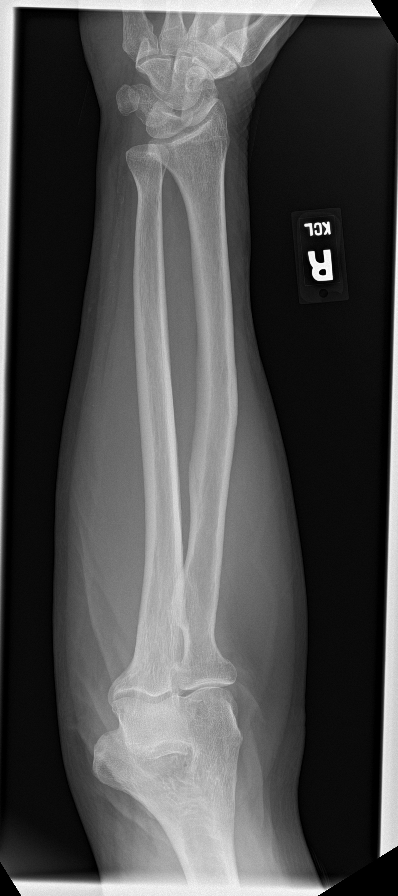

[c-spine ap]
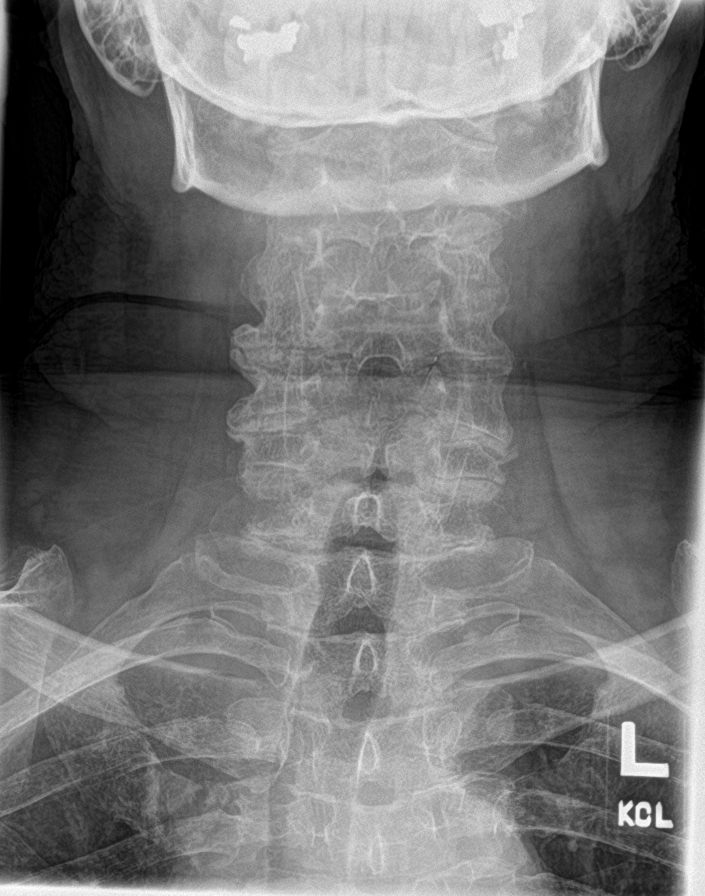

[c-spine lat]
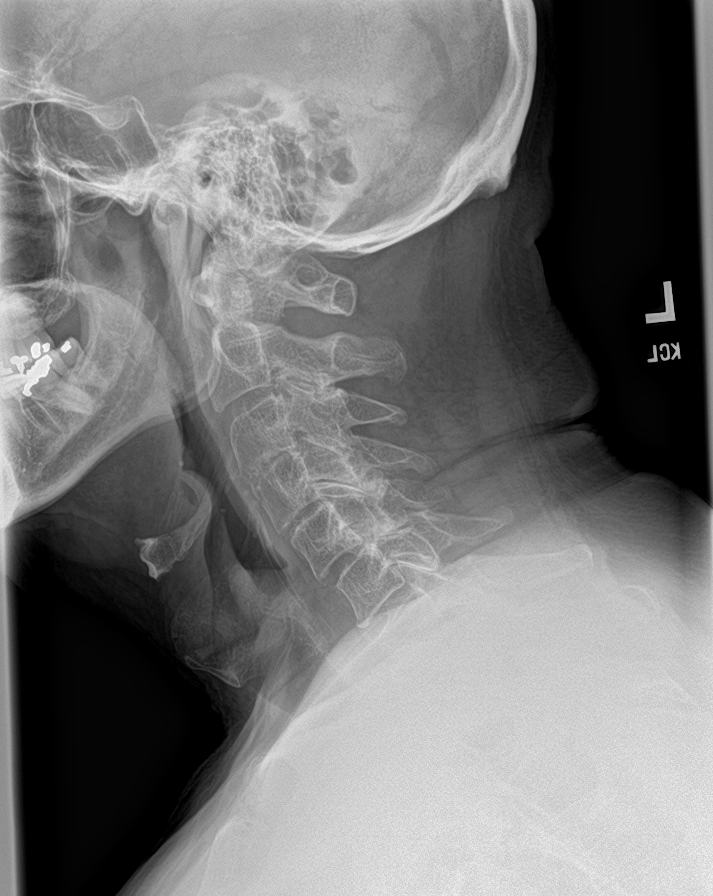

[t-spine ap]
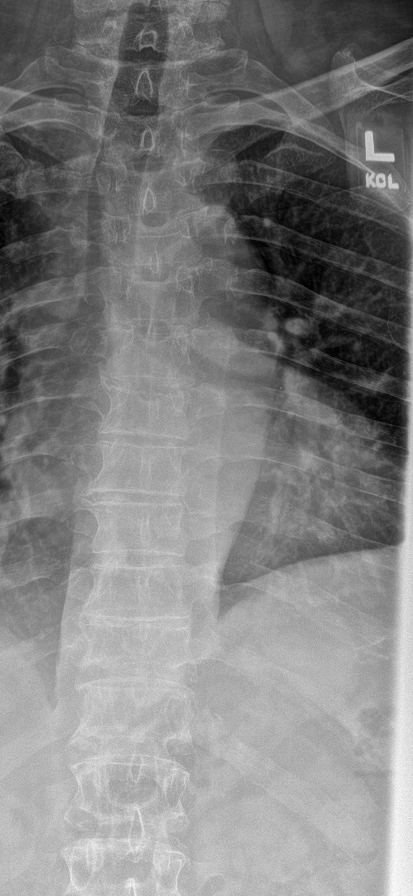

[10 of 10 positions shown; findings below may reference images not displayed]

FINDINGS: Lateral skull image shows no suspicious lucency. The mandible and
facial structures are unremarkable.

Upper extremities: Osteopenic appearance without focal lucency.
There is chondroid matrix in the medullary space of the proximal
right humerus compatible with enchondroma, 2.2 cm in maximum. No
aggressive features at this level.

Severe glenohumeral osteoarthritis on the left with bulky inferior
spurring.

Spine: Degenerative cervical facet spurring asymmetric to the right.
C3-4 ankylosis. T11 compression fracture with a chronic appearance.
There are bridging osteophytes at T10-11. Disc narrowing that is
advanced at L5-S1 and moderate at L4-5. No destructive process is
seen.

Chest: No evidence of acute cardiopulmonary disease. No detected rib
lesion. Heart size is normal.

Pelvis: No evidence of lesion. There is bilateral degenerative hip
narrowing with spurring and subchondral cystic change greater on the
left. There is limitation due to bowel gas over the bilateral iliac
bones and sacrum, unavoidable.

Lower extremities: Chondroid matrix within the medullary space of
the upper left femoral shaft, involving an 11 cm length and 2 cm
diameter. No superimposed aggressive features. No lucent lesion.

Frond like 22mm nodular appearance over the left thigh, likely
cutaneous and possibly from seborrheic keratosis, this would be
better evaluated by physical exam. No evidence of lucent lesion.
IMPRESSION: 1. No lucent or destructive lesion to implicate myeloma. There is
osteopenia and a remote T11 compression fracture.
2. Enchondromas in the proximal right humerus and proximal left
femur without aggressive feature.
3. Apparent 2 cm skin lesion along the left thigh, please correlate
with physical exam.

## 2019-10-26 DIAGNOSIS — E1142 Type 2 diabetes mellitus with diabetic polyneuropathy: Secondary | ICD-10-CM | POA: Diagnosis not present

## 2019-11-05 DIAGNOSIS — E1142 Type 2 diabetes mellitus with diabetic polyneuropathy: Secondary | ICD-10-CM | POA: Diagnosis not present

## 2019-11-12 DIAGNOSIS — E119 Type 2 diabetes mellitus without complications: Secondary | ICD-10-CM | POA: Diagnosis not present

## 2019-11-12 DIAGNOSIS — J45909 Unspecified asthma, uncomplicated: Secondary | ICD-10-CM | POA: Diagnosis not present

## 2019-11-12 DIAGNOSIS — M1990 Unspecified osteoarthritis, unspecified site: Secondary | ICD-10-CM | POA: Diagnosis not present

## 2019-11-12 DIAGNOSIS — I10 Essential (primary) hypertension: Secondary | ICD-10-CM | POA: Diagnosis not present

## 2019-11-12 DIAGNOSIS — E7800 Pure hypercholesterolemia, unspecified: Secondary | ICD-10-CM | POA: Diagnosis not present

## 2019-11-12 DIAGNOSIS — R69 Illness, unspecified: Secondary | ICD-10-CM | POA: Diagnosis not present

## 2019-11-12 DIAGNOSIS — I2510 Atherosclerotic heart disease of native coronary artery without angina pectoris: Secondary | ICD-10-CM | POA: Diagnosis not present

## 2019-11-12 DIAGNOSIS — G629 Polyneuropathy, unspecified: Secondary | ICD-10-CM | POA: Diagnosis not present

## 2019-11-12 DIAGNOSIS — I739 Peripheral vascular disease, unspecified: Secondary | ICD-10-CM | POA: Diagnosis not present

## 2019-11-12 DIAGNOSIS — E291 Testicular hypofunction: Secondary | ICD-10-CM | POA: Diagnosis not present

## 2020-02-27 DIAGNOSIS — M1711 Unilateral primary osteoarthritis, right knee: Secondary | ICD-10-CM | POA: Diagnosis not present

## 2020-02-27 DIAGNOSIS — M1712 Unilateral primary osteoarthritis, left knee: Secondary | ICD-10-CM | POA: Diagnosis not present

## 2020-02-27 DIAGNOSIS — M170 Bilateral primary osteoarthritis of knee: Secondary | ICD-10-CM | POA: Diagnosis not present

## 2020-02-27 DIAGNOSIS — M25561 Pain in right knee: Secondary | ICD-10-CM | POA: Diagnosis not present

## 2020-02-27 DIAGNOSIS — M25562 Pain in left knee: Secondary | ICD-10-CM | POA: Diagnosis not present

## 2020-03-12 DIAGNOSIS — M170 Bilateral primary osteoarthritis of knee: Secondary | ICD-10-CM | POA: Diagnosis not present

## 2020-03-12 DIAGNOSIS — M1712 Unilateral primary osteoarthritis, left knee: Secondary | ICD-10-CM | POA: Diagnosis not present

## 2020-03-12 DIAGNOSIS — M1711 Unilateral primary osteoarthritis, right knee: Secondary | ICD-10-CM | POA: Diagnosis not present

## 2020-03-12 DIAGNOSIS — M25562 Pain in left knee: Secondary | ICD-10-CM | POA: Diagnosis not present

## 2020-03-12 DIAGNOSIS — M25561 Pain in right knee: Secondary | ICD-10-CM | POA: Diagnosis not present

## 2020-03-19 DIAGNOSIS — M1712 Unilateral primary osteoarthritis, left knee: Secondary | ICD-10-CM | POA: Diagnosis not present

## 2020-03-19 DIAGNOSIS — M170 Bilateral primary osteoarthritis of knee: Secondary | ICD-10-CM | POA: Diagnosis not present

## 2020-03-19 DIAGNOSIS — M1711 Unilateral primary osteoarthritis, right knee: Secondary | ICD-10-CM | POA: Diagnosis not present

## 2020-03-26 DIAGNOSIS — M170 Bilateral primary osteoarthritis of knee: Secondary | ICD-10-CM | POA: Diagnosis not present

## 2020-03-26 DIAGNOSIS — M1711 Unilateral primary osteoarthritis, right knee: Secondary | ICD-10-CM | POA: Diagnosis not present

## 2020-03-26 DIAGNOSIS — M1712 Unilateral primary osteoarthritis, left knee: Secondary | ICD-10-CM | POA: Diagnosis not present

## 2020-05-26 ENCOUNTER — Encounter: Payer: Self-pay | Admitting: Dermatology

## 2020-05-26 ENCOUNTER — Ambulatory Visit: Payer: Medicare HMO | Admitting: Dermatology

## 2020-05-26 ENCOUNTER — Other Ambulatory Visit: Payer: Self-pay

## 2020-05-26 DIAGNOSIS — Z85828 Personal history of other malignant neoplasm of skin: Secondary | ICD-10-CM

## 2020-05-26 DIAGNOSIS — L820 Inflamed seborrheic keratosis: Secondary | ICD-10-CM | POA: Diagnosis not present

## 2020-05-26 DIAGNOSIS — L570 Actinic keratosis: Secondary | ICD-10-CM

## 2020-05-26 DIAGNOSIS — D485 Neoplasm of uncertain behavior of skin: Secondary | ICD-10-CM | POA: Diagnosis not present

## 2020-05-26 DIAGNOSIS — Z1283 Encounter for screening for malignant neoplasm of skin: Secondary | ICD-10-CM

## 2020-05-26 NOTE — Patient Instructions (Signed)

## 2020-05-28 ENCOUNTER — Encounter: Payer: Self-pay | Admitting: Dermatology

## 2020-05-28 NOTE — Progress Notes (Signed)
   Follow-Up Visit   Subjective  Zachary Nolan is a 79 y.o. male who presents for the following: Skin Problem (right shoulder- when I called was red & sore- now its gone, Left thigh- ?sk wants rechecked).  General skin check Location: Crusts on left upper leg and right temple monitor Duration:  Quality:  Associated Signs/Symptoms: Modifying Factors:  Severity:  Timing: Context: History of skin cancers  Objective  Well appearing patient in no apparent distress; mood and affect are within normal limits.  All skin waist up examined.  Plus legs   Assessment & Plan    Neoplasm of uncertain behavior of skin (2) Left Thigh - Anterior  Skin / nail biopsy Type of biopsy: tangential   Informed consent: discussed and consent obtained   Timeout: patient name, date of birth, surgical site, and procedure verified   Procedure prep:  Patient was prepped and draped in usual sterile fashion (Non sterile) Prep type:  Chlorhexidine Anesthesia: the lesion was anesthetized in a standard fashion   Anesthetic:  1% lidocaine w/ epinephrine 1-100,000 local infiltration Instrument used: flexible razor blade   Hemostasis achieved with: electrodesiccation   Outcome: patient tolerated procedure well   Post-procedure details: wound care instructions given    Specimen 1 - Surgical pathology Differential Diagnosis: R/O BCC vs SCC vs Wart Check Margins: No Cautery  Right Temple  Skin / nail biopsy Type of biopsy: tangential   Informed consent: discussed and consent obtained   Timeout: patient name, date of birth, surgical site, and procedure verified   Procedure prep:  Patient was prepped and draped in usual sterile fashion (Non sterile) Prep type:  Chlorhexidine Anesthesia: the lesion was anesthetized in a standard fashion   Anesthetic:  1% lidocaine w/ epinephrine 1-100,000 local infiltration Instrument used: flexible razor blade   Outcome: patient tolerated procedure well   Post-procedure  details: wound care instructions given    Specimen 2 - Surgical pathology Differential Diagnosis: R/O BCC vs SCC Check Margins: No  AK (actinic keratosis) Left Forearm - Posterior  Destruction of lesion - Left Forearm - Posterior Complexity: simple   Destruction method: cryotherapy   Informed consent: discussed and consent obtained   Timeout:  patient name, date of birth, surgical site, and procedure verified Lesion destroyed using liquid nitrogen: Yes   Cryotherapy cycles:  3 Outcome: patient tolerated procedure well with no complications   Post-procedure details: wound care instructions given    Skin exam for malignant neoplasm Mid Back  Annual skin examination     I, Lavonna Monarch, MD, have reviewed all documentation for this visit.  The documentation on 05/28/20 for the exam, diagnosis, procedures, and orders are all accurate and complete.

## 2020-07-20 ENCOUNTER — Ambulatory Visit: Payer: Medicare HMO | Admitting: Dermatology

## 2020-07-20 ENCOUNTER — Other Ambulatory Visit: Payer: Self-pay

## 2020-07-20 ENCOUNTER — Encounter: Payer: Self-pay | Admitting: Dermatology

## 2020-07-20 DIAGNOSIS — C44629 Squamous cell carcinoma of skin of left upper limb, including shoulder: Secondary | ICD-10-CM

## 2020-07-20 DIAGNOSIS — D485 Neoplasm of uncertain behavior of skin: Secondary | ICD-10-CM

## 2020-07-20 DIAGNOSIS — Z85828 Personal history of other malignant neoplasm of skin: Secondary | ICD-10-CM

## 2020-07-20 NOTE — Patient Instructions (Signed)

## 2020-07-22 DIAGNOSIS — R7989 Other specified abnormal findings of blood chemistry: Secondary | ICD-10-CM | POA: Diagnosis not present

## 2020-07-22 DIAGNOSIS — R69 Illness, unspecified: Secondary | ICD-10-CM | POA: Diagnosis not present

## 2020-07-22 DIAGNOSIS — G2581 Restless legs syndrome: Secondary | ICD-10-CM | POA: Diagnosis not present

## 2020-07-22 DIAGNOSIS — I10 Essential (primary) hypertension: Secondary | ICD-10-CM | POA: Diagnosis not present

## 2020-07-22 DIAGNOSIS — E119 Type 2 diabetes mellitus without complications: Secondary | ICD-10-CM | POA: Diagnosis not present

## 2020-08-14 DIAGNOSIS — E7800 Pure hypercholesterolemia, unspecified: Secondary | ICD-10-CM | POA: Diagnosis not present

## 2020-08-14 DIAGNOSIS — Z0000 Encounter for general adult medical examination without abnormal findings: Secondary | ICD-10-CM | POA: Diagnosis not present

## 2020-08-14 DIAGNOSIS — Z79899 Other long term (current) drug therapy: Secondary | ICD-10-CM | POA: Diagnosis not present

## 2020-08-14 DIAGNOSIS — E119 Type 2 diabetes mellitus without complications: Secondary | ICD-10-CM | POA: Diagnosis not present

## 2020-08-14 DIAGNOSIS — R7989 Other specified abnormal findings of blood chemistry: Secondary | ICD-10-CM | POA: Diagnosis not present

## 2020-08-21 DIAGNOSIS — N1831 Chronic kidney disease, stage 3a: Secondary | ICD-10-CM | POA: Diagnosis not present

## 2020-08-21 DIAGNOSIS — Z5320 Procedure and treatment not carried out because of patient's decision for unspecified reasons: Secondary | ICD-10-CM | POA: Diagnosis not present

## 2020-08-21 DIAGNOSIS — Z0000 Encounter for general adult medical examination without abnormal findings: Secondary | ICD-10-CM | POA: Diagnosis not present

## 2020-08-21 DIAGNOSIS — Z79899 Other long term (current) drug therapy: Secondary | ICD-10-CM | POA: Diagnosis not present

## 2020-08-21 DIAGNOSIS — E7800 Pure hypercholesterolemia, unspecified: Secondary | ICD-10-CM | POA: Diagnosis not present

## 2020-08-21 DIAGNOSIS — E1122 Type 2 diabetes mellitus with diabetic chronic kidney disease: Secondary | ICD-10-CM | POA: Diagnosis not present

## 2020-08-21 DIAGNOSIS — D692 Other nonthrombocytopenic purpura: Secondary | ICD-10-CM | POA: Diagnosis not present

## 2020-08-21 DIAGNOSIS — I10 Essential (primary) hypertension: Secondary | ICD-10-CM | POA: Diagnosis not present

## 2020-08-21 DIAGNOSIS — Z23 Encounter for immunization: Secondary | ICD-10-CM | POA: Diagnosis not present

## 2020-08-21 DIAGNOSIS — K219 Gastro-esophageal reflux disease without esophagitis: Secondary | ICD-10-CM | POA: Diagnosis not present

## 2020-09-30 DIAGNOSIS — M25511 Pain in right shoulder: Secondary | ICD-10-CM | POA: Diagnosis not present

## 2020-09-30 DIAGNOSIS — M19012 Primary osteoarthritis, left shoulder: Secondary | ICD-10-CM | POA: Diagnosis not present

## 2020-10-05 DIAGNOSIS — R260 Ataxic gait: Secondary | ICD-10-CM | POA: Diagnosis not present

## 2020-10-13 DIAGNOSIS — R260 Ataxic gait: Secondary | ICD-10-CM | POA: Diagnosis not present

## 2020-10-15 DIAGNOSIS — R260 Ataxic gait: Secondary | ICD-10-CM | POA: Diagnosis not present

## 2020-10-20 DIAGNOSIS — R260 Ataxic gait: Secondary | ICD-10-CM | POA: Diagnosis not present

## 2020-10-22 DIAGNOSIS — R260 Ataxic gait: Secondary | ICD-10-CM | POA: Diagnosis not present

## 2020-10-26 DIAGNOSIS — R260 Ataxic gait: Secondary | ICD-10-CM | POA: Diagnosis not present

## 2020-10-28 DIAGNOSIS — R260 Ataxic gait: Secondary | ICD-10-CM | POA: Diagnosis not present

## 2020-11-02 DIAGNOSIS — R260 Ataxic gait: Secondary | ICD-10-CM | POA: Diagnosis not present

## 2020-11-04 DIAGNOSIS — R260 Ataxic gait: Secondary | ICD-10-CM | POA: Diagnosis not present

## 2020-11-09 DIAGNOSIS — R260 Ataxic gait: Secondary | ICD-10-CM | POA: Diagnosis not present

## 2020-11-12 DIAGNOSIS — R260 Ataxic gait: Secondary | ICD-10-CM | POA: Diagnosis not present

## 2021-01-11 DIAGNOSIS — G8929 Other chronic pain: Secondary | ICD-10-CM | POA: Diagnosis not present

## 2021-01-11 DIAGNOSIS — G2581 Restless legs syndrome: Secondary | ICD-10-CM | POA: Diagnosis not present

## 2021-01-11 DIAGNOSIS — I25119 Atherosclerotic heart disease of native coronary artery with unspecified angina pectoris: Secondary | ICD-10-CM | POA: Diagnosis not present

## 2021-01-11 DIAGNOSIS — E1142 Type 2 diabetes mellitus with diabetic polyneuropathy: Secondary | ICD-10-CM | POA: Diagnosis not present

## 2021-01-11 DIAGNOSIS — H547 Unspecified visual loss: Secondary | ICD-10-CM | POA: Diagnosis not present

## 2021-01-11 DIAGNOSIS — Z008 Encounter for other general examination: Secondary | ICD-10-CM | POA: Diagnosis not present

## 2021-01-11 DIAGNOSIS — E1136 Type 2 diabetes mellitus with diabetic cataract: Secondary | ICD-10-CM | POA: Diagnosis not present

## 2021-01-11 DIAGNOSIS — E669 Obesity, unspecified: Secondary | ICD-10-CM | POA: Diagnosis not present

## 2021-01-11 DIAGNOSIS — J45909 Unspecified asthma, uncomplicated: Secondary | ICD-10-CM | POA: Diagnosis not present

## 2021-01-11 DIAGNOSIS — I10 Essential (primary) hypertension: Secondary | ICD-10-CM | POA: Diagnosis not present

## 2021-01-11 DIAGNOSIS — I252 Old myocardial infarction: Secondary | ICD-10-CM | POA: Diagnosis not present

## 2021-01-31 ENCOUNTER — Encounter: Payer: Self-pay | Admitting: Dermatology

## 2021-01-31 NOTE — Progress Notes (Signed)
   Follow-Up Visit   Subjective  Zachary Nolan is a 80 y.o. male who presents for the following: Skin Problem (Left arm- new spot- "sore").  New tender growth left forearm Location:  Duration:  Quality:  Associated Signs/Symptoms: Modifying Factors:  Severity:  Timing: Context:   Objective  Well appearing patient in no apparent distress; mood and affect are within normal limits. Left Breast No sign recurrence  Left Forearm - Posterior Volcano like 1.2 cm nodule       All skin waist up examined.   Assessment & Plan    History of squamous cell carcinoma of skin Left Breast  Check as needed change  Squamous cell carcinoma of skin of left upper extremity, including shoulder Left Forearm - Posterior  Skin / nail biopsy Type of biopsy: tangential   Informed consent: discussed and consent obtained   Timeout: patient name, date of birth, surgical site, and procedure verified   Procedure prep:  Patient was prepped and draped in usual sterile fashion (Non sterile) Prep type:  Chlorhexidine Anesthesia: the lesion was anesthetized in a standard fashion   Anesthetic:  1% lidocaine w/ epinephrine 1-100,000 local infiltration Instrument used: flexible razor blade   Outcome: patient tolerated procedure well   Post-procedure details: wound care instructions given    Destruction of lesion Complexity: simple   Destruction method: electrodesiccation and curettage   Informed consent: discussed and consent obtained   Timeout:  patient name, date of birth, surgical site, and procedure verified Anesthesia: the lesion was anesthetized in a standard fashion   Anesthetic:  1% lidocaine w/ epinephrine 1-100,000 local infiltration Curettage performed in three different directions: Yes   Curettage cycles:  3 Lesion length (cm):  1.2 Lesion width (cm):  1.2 Margin per side (cm):  0 Final wound size (cm):  1.2 Hemostasis achieved with:  aluminum chloride Outcome: patient tolerated  procedure well with no complications   Post-procedure details: wound care instructions given    Specimen 1 - Surgical pathology Differential Diagnosis: bcc vs scc (2 specimens)  Check Margins: No  After shave biopsy the base of the lesion was treated by curettage plus cautery      I, Lavonna Monarch, MD, have reviewed all documentation for this visit.  The documentation on 01/31/21 for the exam, diagnosis, procedures, and orders are all accurate and complete.

## 2021-02-11 DIAGNOSIS — I10 Essential (primary) hypertension: Secondary | ICD-10-CM | POA: Diagnosis not present

## 2021-02-11 DIAGNOSIS — E1122 Type 2 diabetes mellitus with diabetic chronic kidney disease: Secondary | ICD-10-CM | POA: Diagnosis not present

## 2021-02-11 DIAGNOSIS — R7989 Other specified abnormal findings of blood chemistry: Secondary | ICD-10-CM | POA: Diagnosis not present

## 2021-02-11 DIAGNOSIS — E7801 Familial hypercholesterolemia: Secondary | ICD-10-CM | POA: Diagnosis not present

## 2021-02-11 DIAGNOSIS — Z79899 Other long term (current) drug therapy: Secondary | ICD-10-CM | POA: Diagnosis not present

## 2021-02-18 DIAGNOSIS — E7800 Pure hypercholesterolemia, unspecified: Secondary | ICD-10-CM | POA: Diagnosis not present

## 2021-02-18 DIAGNOSIS — E1122 Type 2 diabetes mellitus with diabetic chronic kidney disease: Secondary | ICD-10-CM | POA: Diagnosis not present

## 2021-02-18 DIAGNOSIS — N1831 Chronic kidney disease, stage 3a: Secondary | ICD-10-CM | POA: Diagnosis not present

## 2021-02-18 DIAGNOSIS — Z79899 Other long term (current) drug therapy: Secondary | ICD-10-CM | POA: Diagnosis not present

## 2021-02-18 DIAGNOSIS — G629 Polyneuropathy, unspecified: Secondary | ICD-10-CM | POA: Diagnosis not present

## 2021-02-18 DIAGNOSIS — R69 Illness, unspecified: Secondary | ICD-10-CM | POA: Diagnosis not present

## 2021-02-18 DIAGNOSIS — I10 Essential (primary) hypertension: Secondary | ICD-10-CM | POA: Diagnosis not present

## 2021-02-18 DIAGNOSIS — I739 Peripheral vascular disease, unspecified: Secondary | ICD-10-CM | POA: Diagnosis not present

## 2021-02-18 DIAGNOSIS — J45909 Unspecified asthma, uncomplicated: Secondary | ICD-10-CM | POA: Diagnosis not present

## 2021-02-18 DIAGNOSIS — K219 Gastro-esophageal reflux disease without esophagitis: Secondary | ICD-10-CM | POA: Diagnosis not present

## 2021-02-24 DIAGNOSIS — Z77122 Contact with and (suspected) exposure to noise: Secondary | ICD-10-CM | POA: Diagnosis not present

## 2021-02-24 DIAGNOSIS — H903 Sensorineural hearing loss, bilateral: Secondary | ICD-10-CM | POA: Diagnosis not present

## 2021-02-24 DIAGNOSIS — H9313 Tinnitus, bilateral: Secondary | ICD-10-CM | POA: Diagnosis not present

## 2021-03-09 DIAGNOSIS — M25511 Pain in right shoulder: Secondary | ICD-10-CM | POA: Diagnosis not present

## 2021-04-20 DIAGNOSIS — Z23 Encounter for immunization: Secondary | ICD-10-CM | POA: Diagnosis not present

## 2021-04-20 DIAGNOSIS — G629 Polyneuropathy, unspecified: Secondary | ICD-10-CM | POA: Diagnosis not present

## 2021-04-20 DIAGNOSIS — F418 Other specified anxiety disorders: Secondary | ICD-10-CM | POA: Diagnosis not present

## 2021-04-20 DIAGNOSIS — R69 Illness, unspecified: Secondary | ICD-10-CM | POA: Diagnosis not present

## 2021-04-20 DIAGNOSIS — J45909 Unspecified asthma, uncomplicated: Secondary | ICD-10-CM | POA: Diagnosis not present

## 2021-05-26 ENCOUNTER — Ambulatory Visit: Payer: Medicare HMO | Admitting: Dermatology

## 2021-06-15 DIAGNOSIS — M1712 Unilateral primary osteoarthritis, left knee: Secondary | ICD-10-CM | POA: Diagnosis not present

## 2021-06-15 DIAGNOSIS — M170 Bilateral primary osteoarthritis of knee: Secondary | ICD-10-CM | POA: Diagnosis not present

## 2021-06-29 DIAGNOSIS — M130 Polyarthritis, unspecified: Secondary | ICD-10-CM | POA: Diagnosis not present

## 2021-06-29 DIAGNOSIS — R69 Illness, unspecified: Secondary | ICD-10-CM | POA: Diagnosis not present

## 2021-06-29 DIAGNOSIS — G629 Polyneuropathy, unspecified: Secondary | ICD-10-CM | POA: Diagnosis not present

## 2021-07-13 DIAGNOSIS — G629 Polyneuropathy, unspecified: Secondary | ICD-10-CM | POA: Diagnosis not present

## 2021-07-13 DIAGNOSIS — R69 Illness, unspecified: Secondary | ICD-10-CM | POA: Diagnosis not present

## 2021-07-13 DIAGNOSIS — M130 Polyarthritis, unspecified: Secondary | ICD-10-CM | POA: Diagnosis not present

## 2021-08-17 DIAGNOSIS — Z79899 Other long term (current) drug therapy: Secondary | ICD-10-CM | POA: Diagnosis not present

## 2021-08-17 DIAGNOSIS — E1122 Type 2 diabetes mellitus with diabetic chronic kidney disease: Secondary | ICD-10-CM | POA: Diagnosis not present

## 2021-08-17 DIAGNOSIS — D649 Anemia, unspecified: Secondary | ICD-10-CM | POA: Diagnosis not present

## 2021-08-17 DIAGNOSIS — E7801 Familial hypercholesterolemia: Secondary | ICD-10-CM | POA: Diagnosis not present

## 2021-08-17 DIAGNOSIS — N1831 Chronic kidney disease, stage 3a: Secondary | ICD-10-CM | POA: Diagnosis not present

## 2021-08-17 DIAGNOSIS — R7989 Other specified abnormal findings of blood chemistry: Secondary | ICD-10-CM | POA: Diagnosis not present

## 2021-08-23 DIAGNOSIS — Z0000 Encounter for general adult medical examination without abnormal findings: Secondary | ICD-10-CM | POA: Diagnosis not present

## 2021-08-23 DIAGNOSIS — D692 Other nonthrombocytopenic purpura: Secondary | ICD-10-CM | POA: Diagnosis not present

## 2021-08-23 DIAGNOSIS — R69 Illness, unspecified: Secondary | ICD-10-CM | POA: Diagnosis not present

## 2021-08-23 DIAGNOSIS — I10 Essential (primary) hypertension: Secondary | ICD-10-CM | POA: Diagnosis not present

## 2021-08-23 DIAGNOSIS — M130 Polyarthritis, unspecified: Secondary | ICD-10-CM | POA: Diagnosis not present

## 2021-08-23 DIAGNOSIS — I739 Peripheral vascular disease, unspecified: Secondary | ICD-10-CM | POA: Diagnosis not present

## 2021-08-23 DIAGNOSIS — E7800 Pure hypercholesterolemia, unspecified: Secondary | ICD-10-CM | POA: Diagnosis not present

## 2021-08-23 DIAGNOSIS — K219 Gastro-esophageal reflux disease without esophagitis: Secondary | ICD-10-CM | POA: Diagnosis not present

## 2021-08-23 DIAGNOSIS — E1122 Type 2 diabetes mellitus with diabetic chronic kidney disease: Secondary | ICD-10-CM | POA: Diagnosis not present

## 2021-08-23 DIAGNOSIS — N1832 Chronic kidney disease, stage 3b: Secondary | ICD-10-CM | POA: Diagnosis not present

## 2021-08-31 ENCOUNTER — Encounter: Payer: Self-pay | Admitting: Cardiology

## 2021-08-31 ENCOUNTER — Other Ambulatory Visit: Payer: Self-pay

## 2021-08-31 ENCOUNTER — Ambulatory Visit: Payer: Medicare HMO | Admitting: Cardiology

## 2021-08-31 DIAGNOSIS — E7800 Pure hypercholesterolemia, unspecified: Secondary | ICD-10-CM

## 2021-08-31 DIAGNOSIS — I2510 Atherosclerotic heart disease of native coronary artery without angina pectoris: Secondary | ICD-10-CM

## 2021-08-31 MED ORDER — ASPIRIN EC 81 MG PO TBEC: 81.0000 mg | DELAYED_RELEASE_TABLET | Freq: Every day | ORAL | 3 refills | Status: DC

## 2021-08-31 MED ORDER — ROSUVASTATIN CALCIUM 10 MG PO TABS
10.0000 mg | ORAL_TABLET | Freq: Every day | ORAL | 11 refills | Status: DC
Start: 2021-08-31 — End: 2021-11-08

## 2021-08-31 NOTE — Assessment & Plan Note (Addendum)
Inferior STEMI 2011-DES to RCA 20 mm stent.  Has residual mid circumflex 70% and LAD 70% overall doing fairly well without any significant chest discomfort.  Continue with goal-directed medical therapy.  This includes Zetia 10 mg.  Did not tolerate statin therapy previously, thought that leg weakness was related.Marland Kitchen  He is willing to try again because even after stopping the statin he still felt poor.  He is currently on Zetia 10 mg.  We will add Crestor 10 mg.  He will let us know if any significant changes occur. ?

## 2021-08-31 NOTE — Patient Instructions (Signed)
Medication Instructions:  ?Please start Crestor 10 mg a day. ?Decrease Aspirin to 81 mg a day. ?Continue all other medications as listed. ? ?*If you need a refill on your cardiac medications before your next appointment, please call your pharmacy* ? ?Follow-Up: ?At [facility], you and your health needs are our priority.  As part of our continuing mission to provide you with exceptional heart care, we have created designated Provider Care Teams.  These Care Teams include your primary Cardiologist (physician) and Advanced Practice Providers (APPs -  Physician Assistants and Nurse Practitioners) who all work together to provide you with the care you need, when you need it. ? ?We recommend signing up for the patient portal called "MyChart".  Sign up information is provided on this After Visit Summary.  MyChart is used to connect with patients for Virtual Visits (Telemedicine).  Patients are able to view lab/test results, encounter notes, upcoming appointments, etc.  Non-urgent messages can be sent to your provider as well.   ?To learn more about what you can do with MyChart, go to [email or web address].   ? ?Your next appointment:   ?6 month(s) ? ?The format for your next appointment:   ?In Person ? ?Provider:   ?Nicholes Rough, PA-C, Melina Copa, PA-C, Ermalinda Barrios, PA-C, Christen Bame, NP, or Richardson Dopp, PA-C       ? ?Thank you for choosing [facility]!! ? ? ? ?

## 2021-08-31 NOTE — Progress Notes (Signed)
Cardiology Office Note:    Date:  08/31/2021   ID:  Zachary Nolan, DOB 1941-04-11, MRN [id number]  PCP:  Deon Pilling, NP   [facility] Providers Cardiologist:  None     Referring MD: Deon Pilling, NP    History of Present Illness:    Zachary Nolan is a 81 y.o. male here for the evaluation of coronary artery disease at the request of Deon Pilling, NP.  Has coronary artery disease status post inferior STEMI in 2011 with drug-eluting stent to the RCA.  Had ischemic cardiomyopathy with EF 35 to 40% at that time which improved by nuclear study in 2014.  He also has diabetes hypertension hyperlipidemia.  Has had statin intolerance in the past and has not been on a beta-blocker because of bradycardia.  Has a history obstructive sleep apnea and has had difficulties with CPAP in the past.  Many years ago he saw Dr. Radford Pax for sleep.  No more CP, no SOB. Has OA of breast bone.   Legs were weak with statin (saw Neuro.Marland Kitchen)  He stopped the statin and he still felt some arthritis.  He is willing to try once again.  Currently wearing a hat that says it took me 80 years to look this good.    Past Medical History:  Diagnosis Date   CAD (coronary artery disease)    a. LHC (7/11):  Inf STEMI >>> inf AK, EF 35-40%, LAD 70-75%, mid CFX 70%, dist RCA 99% >>> PCI:  3.5 x 28 mm Promus DES to RCA;     Cancer ([place])    skin   Depression    Diabetes mellitus    Non-insulin-dependent diabetes mellitus.    DJD (degenerative joint disease)    HTN (hypertension)    Hx of cardiovascular stress test    a. Nuclear (3/14):  Small inf defect - likely scar; no ischemia, EF 59%; LOW RISK;  b. Lexiscan Myoview (1/16): No ischemia, fixed inferior defect consistent with prior infarct versus diaphragmatic attenuation, EF 57%, Low Risk   Hyperlipemia    Ischemic cardiomyopathy    EF 35-40% at time of MI in 2011 >> improved to normal on Nuclear study in 2014   OSA (obstructive sleep apnea) 09/10/2014   severe with AHI 68/hr    RLS (restless legs syndrome)    Seasonal allergies     Past Surgical History:  Procedure Laterality Date   COLONOSCOPY     NOSE SURGERY     Percutaneous coronary intervention using a drug-eluting stent (Promus)     Moderately    severe left anterior descending stenosis.  Moderate left     circumflex stenosis, moderate left ventricular dysfunction with   left  ventricular ejection fraction of 35% to 40%.    Release of left transcarpal ligament.     Release of right transcarpal ligament.     TONSILLECTOMY      Current Medications: Current Meds  Medication Sig   ALPRAZolam (XANAX) 0.5 MG tablet Take 0.5 mg by mouth daily.   aspirin EC 81 MG tablet Take 1 tablet (81 mg total) by mouth daily. Swallow whole.   Cyanocobalamin (VITAMIN B 12 PO) Take 1,000 mg by mouth daily.   escitalopram (LEXAPRO) 10 MG tablet Take 10 mg by mouth daily.   ezetimibe (ZETIA) 10 MG tablet Take 5-10 mg by mouth daily.   hydrochlorothiazide (HYDRODIURIL) 25 MG tablet Take 25 mg by mouth daily.   JARDIANCE 10 MG TABS tablet Take 10 mg by mouth  daily.   L-Methylfolate-Algae-B12-B6 (METANX) 3-90.314-2-35 MG CAPS Take 1 capsule by mouth daily.   lisinopril (PRINIVIL,ZESTRIL) 20 MG tablet Take 20 mg by mouth 2 (two) times daily.   metFORMIN (GLUCOPHAGE) 500 MG tablet Take 500 mg by mouth 2 (two) times daily with a meal.    Multiple Vitamin (MULTIVITAMIN) capsule Take 1 capsule by mouth daily.     nitroGLYCERIN (NITROSTAT) 0.4 MG SL tablet Place 0.4 mg under the tongue every 5 (five) minutes as needed for chest pain.   Omega-3 Fatty Acids (FISH OIL) 1000 MG CAPS Take 1 capsule by mouth 2 (two) times daily.   omeprazole (PRILOSEC) 20 MG capsule Take 20 mg by mouth daily.   pioglitazone (ACTOS) 15 MG tablet Take 15 mg by mouth daily.   predniSONE (DELTASONE) 10 MG tablet Take by mouth.   rosuvastatin (CRESTOR) 10 MG tablet Take 1 tablet (10 mg total) by mouth daily.   traMADol (ULTRAM) 50 MG tablet 1 tablet as  needed for pain   VENTOLIN HFA 108 (90 Base) MCG/ACT inhaler Inhale 2 puffs into the lungs every 4 (four) hours as needed. Wheezing or shortness of breath     Allergies:   Dust mite extract, Pollen extract, Shellfish-derived products, and Statins   Social History   Socioeconomic History   Marital status: Married    Spouse name: Not on file   Number of children: Not on file   Years of education: Not on file   Highest education level: Not on file  Occupational History   Not on file  Tobacco Use   Smoking status: Former    Types: Cigarettes    Quit date: 1968    Years since quitting: 29.2   Smokeless tobacco: Never  Vaping Use   Vaping Use: Never used  Substance and Sexual Activity   Alcohol use: No   Drug use: No   Sexual activity: Not on file  Other Topics Concern   Not on file  Social History Narrative   Not on file   Social Determinants of Health   Financial Resource Strain: Not on file  Food Insecurity: Not on file  Transportation Needs: Not on file  Physical Activity: Not on file  Stress: Not on file  Social Connections: Not on file     Family History: The patient's family history includes Asthma in his mother; Coronary artery disease in his unknown relative; Diabetes in his father; Heart attack in his brother and father; Hypertension in his brother, father, and mother. There is no history of Stroke.  ROS:   Please see the history of present illness.    No syncope no bleeding no fevers no chills all other systems reviewed and are negative.  EKGs/Labs/Other Studies Reviewed:    The following studies were reviewed today: Prior stress test 2016 with no ischemia.  Cardiac catheterization as above RCA stent.  Residual LAD and circumflex.  EKG:  EKG is  ordered today.  The ekg ordered today demonstrates sinus rhythm 81 PVC.  Recent Labs: No results found for requested labs within last 8760 hours.  Recent Lipid Panel    Component Value Date/Time   CHOL   01/22/2010 0832    144        ATP III CLASSIFICATION:  <200     mg/dL   Desirable  200-239  mg/dL   Borderline High  >=240    mg/dL   High          TRIG 140 01/22/2010 0832  HDL 37 (L) 01/22/2010 0832   CHOLHDL 3.9 01/22/2010 0832   VLDL 28 01/22/2010 0832   LDLCALC  01/22/2010 0832    79        Total Cholesterol/HDL:CHD Risk Coronary Heart Disease Risk Table                     Men   Women  1/2 Average Risk   3.4   3.3  Average Risk       5.0   4.4  2 X Average Risk   9.6   7.1  3 X Average Risk  23.4   11.0        Use the calculated Patient Ratio above and the CHD Risk Table to determine the patient's CHD Risk.        ATP III CLASSIFICATION (LDL):  <100     mg/dL   Optimal  100-129  mg/dL   Near or Above                    Optimal  130-159  mg/dL   Borderline  160-189  mg/dL   High  >190     mg/dL   Very High     Risk Assessment/Calculations:              Physical Exam:    VS:  BP 132/76 (BP Location: Left Arm, Patient Position: Sitting, Cuff Size: Normal)    Pulse 81    Ht 5' 8.5" (1.74 m)    Wt 202 lb 12.8 oz (92 kg)    SpO2 98%    BMI 30.39 kg/m     Wt Readings from Last 3 Encounters:  08/31/21 202 lb 12.8 oz (92 kg)  02/21/18 199 lb 14.4 oz (90.7 kg)  01/19/18 195 lb (88.5 kg)     GEN:  Well nourished, well developed in no acute distress HEENT: Normal NECK: No JVD; No carotid bruits LYMPHATICS: No lymphadenopathy CARDIAC: RRR, no murmurs, no rubs, gallops, occasional ectopy RESPIRATORY:  Clear to auscultation without rales, wheezing or rhonchi  ABDOMEN: Soft, non-tender, non-distended MUSCULOSKELETAL:  No edema; No deformity  SKIN: Warm and dry NEUROLOGIC:  Alert and oriented x 3 PSYCHIATRIC:  Normal affect   ASSESSMENT:    1. Coronary artery disease involving native coronary artery of native heart without angina pectoris   2. HYPERCHOLESTEROLEMIA    PLAN:    In order of problems listed above:  Coronary artery disease involving native  coronary artery of native heart without angina pectoris Inferior STEMI 2011-DES to RCA 20 mm stent.  Has residual mid circumflex 70% and LAD 70% overall doing fairly well without any significant chest discomfort.  Continue with goal-directed medical therapy.  This includes Zetia 10 mg.  Did not tolerate statin therapy previously, thought that leg weakness was related.Marland Kitchen  He is willing to try again because even after stopping the statin he still felt poor.  He is currently on Zetia 10 mg.  We will add Crestor 10 mg.  He will let us know if any significant changes occur.  HYPERCHOLESTEROLEMIA Zetia 10 mg.  Adding Crestor 10 mg.  Willing to try statin once again.  Watch for any signs of changes.  LDL was 178.         Medication Adjustments/Labs and Tests Ordered: Current medicines are reviewed at length with the patient today.  Concerns regarding medicines are outlined above.  Orders Placed This Encounter  Procedures   EKG 12-Lead  Meds ordered this encounter  Medications   rosuvastatin (CRESTOR) 10 MG tablet    Sig: Take 1 tablet (10 mg total) by mouth daily.    Dispense:  30 tablet    Refill:  11   aspirin EC 81 MG tablet    Sig: Take 1 tablet (81 mg total) by mouth daily. Swallow whole.    Dispense:  90 tablet    Refill:  3    Patient Instructions  Medication Instructions:  Please start Crestor 10 mg a day. Decrease Aspirin to 81 mg a day. Continue all other medications as listed.  *If you need a refill on your cardiac medications before your next appointment, please call your pharmacy*  Follow-Up: At [facility], you and your health needs are our priority.  As part of our continuing mission to provide you with exceptional heart care, we have created designated Provider Care Teams.  These Care Teams include your primary Cardiologist (physician) and Advanced Practice Providers (APPs -  Physician Assistants and Nurse Practitioners) who all work together to provide you with the  care you need, when you need it.  We recommend signing up for the patient portal called "MyChart".  Sign up information is provided on this After Visit Summary.  MyChart is used to connect with patients for Virtual Visits (Telemedicine).  Patients are able to view lab/test results, encounter notes, upcoming appointments, etc.  Non-urgent messages can be sent to your provider as well.   To learn more about what you can do with MyChart, go to [email or web address].    Your next appointment:   6 month(s)  The format for your next appointment:   In Person  Provider:   Nicholes Rough, PA-C, Melina Copa, PA-C, Ermalinda Barrios, PA-C, Christen Bame, NP, or Richardson Dopp, PA-C        Thank you for choosing [facility]!!      Signed, Candee Furbish, MD  08/31/2021 3:25 PM    [place]

## 2021-08-31 NOTE — Assessment & Plan Note (Signed)
Zetia 10 mg.  Adding Crestor 10 mg.  Willing to try statin once again.  Watch for any signs of changes.  LDL was 178. ?

## 2021-10-18 DIAGNOSIS — M25512 Pain in left shoulder: Secondary | ICD-10-CM | POA: Diagnosis not present

## 2021-10-18 DIAGNOSIS — M19012 Primary osteoarthritis, left shoulder: Secondary | ICD-10-CM | POA: Diagnosis not present

## 2021-10-27 DIAGNOSIS — R634 Abnormal weight loss: Secondary | ICD-10-CM | POA: Diagnosis not present

## 2021-10-27 DIAGNOSIS — Z79899 Other long term (current) drug therapy: Secondary | ICD-10-CM | POA: Diagnosis not present

## 2021-10-27 DIAGNOSIS — R5383 Other fatigue: Secondary | ICD-10-CM | POA: Diagnosis not present

## 2021-10-27 DIAGNOSIS — G629 Polyneuropathy, unspecified: Secondary | ICD-10-CM | POA: Diagnosis not present

## 2021-10-27 DIAGNOSIS — M6281 Muscle weakness (generalized): Secondary | ICD-10-CM | POA: Diagnosis not present

## 2021-11-03 DIAGNOSIS — M1990 Unspecified osteoarthritis, unspecified site: Secondary | ICD-10-CM | POA: Diagnosis not present

## 2021-11-03 DIAGNOSIS — R031 Nonspecific low blood-pressure reading: Secondary | ICD-10-CM | POA: Diagnosis not present

## 2021-11-03 DIAGNOSIS — E1122 Type 2 diabetes mellitus with diabetic chronic kidney disease: Secondary | ICD-10-CM | POA: Diagnosis not present

## 2021-11-03 DIAGNOSIS — E7800 Pure hypercholesterolemia, unspecified: Secondary | ICD-10-CM | POA: Diagnosis not present

## 2021-11-03 DIAGNOSIS — Z5320 Procedure and treatment not carried out because of patient's decision for unspecified reasons: Secondary | ICD-10-CM | POA: Diagnosis not present

## 2021-11-05 ENCOUNTER — Encounter (HOSPITAL_COMMUNITY): Payer: Self-pay | Admitting: Emergency Medicine

## 2021-11-05 ENCOUNTER — Inpatient Hospital Stay (HOSPITAL_COMMUNITY)
Admission: EM | Admit: 2021-11-05 | Discharge: 2021-11-08 | DRG: 683 | Disposition: A | Discharge status: Home / Self Care | Payer: Medicare HMO | Attending: Internal Medicine | Admitting: Internal Medicine

## 2021-11-05 ENCOUNTER — Other Ambulatory Visit: Payer: Self-pay

## 2021-11-05 ENCOUNTER — Emergency Department (HOSPITAL_COMMUNITY): Payer: Medicare HMO

## 2021-11-05 DIAGNOSIS — I252 Old myocardial infarction: Secondary | ICD-10-CM | POA: Diagnosis not present

## 2021-11-05 DIAGNOSIS — E1110 Type 2 diabetes mellitus with ketoacidosis without coma: Secondary | ICD-10-CM

## 2021-11-05 DIAGNOSIS — I255 Ischemic cardiomyopathy: Secondary | ICD-10-CM | POA: Diagnosis present

## 2021-11-05 DIAGNOSIS — Z789 Other specified health status: Secondary | ICD-10-CM

## 2021-11-05 DIAGNOSIS — R0600 Dyspnea, unspecified: Secondary | ICD-10-CM | POA: Diagnosis not present

## 2021-11-05 DIAGNOSIS — J302 Other seasonal allergic rhinitis: Secondary | ICD-10-CM | POA: Diagnosis present

## 2021-11-05 DIAGNOSIS — I2510 Atherosclerotic heart disease of native coronary artery without angina pectoris: Secondary | ICD-10-CM | POA: Diagnosis present

## 2021-11-05 DIAGNOSIS — I10 Essential (primary) hypertension: Secondary | ICD-10-CM | POA: Diagnosis not present

## 2021-11-05 DIAGNOSIS — Z7982 Long term (current) use of aspirin: Secondary | ICD-10-CM

## 2021-11-05 DIAGNOSIS — I493 Ventricular premature depolarization: Secondary | ICD-10-CM | POA: Diagnosis not present

## 2021-11-05 DIAGNOSIS — R7989 Other specified abnormal findings of blood chemistry: Secondary | ICD-10-CM

## 2021-11-05 DIAGNOSIS — N179 Acute kidney failure, unspecified: Secondary | ICD-10-CM | POA: Diagnosis not present

## 2021-11-05 DIAGNOSIS — R531 Weakness: Secondary | ICD-10-CM | POA: Diagnosis not present

## 2021-11-05 DIAGNOSIS — Z87891 Personal history of nicotine dependence: Secondary | ICD-10-CM

## 2021-11-05 DIAGNOSIS — F32A Depression, unspecified: Secondary | ICD-10-CM | POA: Diagnosis present

## 2021-11-05 DIAGNOSIS — Z8249 Family history of ischemic heart disease and other diseases of the circulatory system: Secondary | ICD-10-CM

## 2021-11-05 DIAGNOSIS — G4733 Obstructive sleep apnea (adult) (pediatric): Secondary | ICD-10-CM | POA: Diagnosis present

## 2021-11-05 DIAGNOSIS — E1165 Type 2 diabetes mellitus with hyperglycemia: Secondary | ICD-10-CM

## 2021-11-05 DIAGNOSIS — Z7984 Long term (current) use of oral hypoglycemic drugs: Secondary | ICD-10-CM

## 2021-11-05 DIAGNOSIS — R778 Other specified abnormalities of plasma proteins: Secondary | ICD-10-CM | POA: Diagnosis not present

## 2021-11-05 DIAGNOSIS — E7800 Pure hypercholesterolemia, unspecified: Secondary | ICD-10-CM | POA: Diagnosis not present

## 2021-11-05 DIAGNOSIS — Z888 Allergy status to other drugs, medicaments and biological substances status: Secondary | ICD-10-CM

## 2021-11-05 DIAGNOSIS — E119 Type 2 diabetes mellitus without complications: Secondary | ICD-10-CM

## 2021-11-05 DIAGNOSIS — N183 Chronic kidney disease, stage 3 unspecified: Secondary | ICD-10-CM | POA: Diagnosis present

## 2021-11-05 DIAGNOSIS — R0602 Shortness of breath: Principal | ICD-10-CM

## 2021-11-05 DIAGNOSIS — I129 Hypertensive chronic kidney disease with stage 1 through stage 4 chronic kidney disease, or unspecified chronic kidney disease: Secondary | ICD-10-CM | POA: Diagnosis present

## 2021-11-05 DIAGNOSIS — E1122 Type 2 diabetes mellitus with diabetic chronic kidney disease: Secondary | ICD-10-CM | POA: Diagnosis not present

## 2021-11-05 DIAGNOSIS — Z91048 Other nonmedicinal substance allergy status: Secondary | ICD-10-CM

## 2021-11-05 DIAGNOSIS — Z743 Need for continuous supervision: Secondary | ICD-10-CM | POA: Diagnosis not present

## 2021-11-05 DIAGNOSIS — R079 Chest pain, unspecified: Secondary | ICD-10-CM | POA: Diagnosis present

## 2021-11-05 DIAGNOSIS — I472 Ventricular tachycardia, unspecified: Secondary | ICD-10-CM | POA: Diagnosis present

## 2021-11-05 DIAGNOSIS — M1990 Unspecified osteoarthritis, unspecified site: Secondary | ICD-10-CM | POA: Diagnosis present

## 2021-11-05 DIAGNOSIS — E785 Hyperlipidemia, unspecified: Secondary | ICD-10-CM

## 2021-11-05 DIAGNOSIS — R739 Hyperglycemia, unspecified: Secondary | ICD-10-CM | POA: Diagnosis not present

## 2021-11-05 DIAGNOSIS — Z91013 Allergy to seafood: Secondary | ICD-10-CM

## 2021-11-05 DIAGNOSIS — I350 Nonrheumatic aortic (valve) stenosis: Secondary | ICD-10-CM | POA: Diagnosis present

## 2021-11-05 DIAGNOSIS — Z79899 Other long term (current) drug therapy: Secondary | ICD-10-CM

## 2021-11-05 DIAGNOSIS — Z955 Presence of coronary angioplasty implant and graft: Secondary | ICD-10-CM

## 2021-11-05 LAB — URINALYSIS, ROUTINE W REFLEX MICROSCOPIC
Bilirubin Urine: NEGATIVE
Glucose, UA: >=500 mg/dL — AB
Hgb urine dipstick: NEGATIVE
Ketones, ur: NEGATIVE mg/dL
Leukocytes,Ua: NEGATIVE
Nitrite: NEGATIVE
Protein, ur: NEGATIVE mg/dL
Specific Gravity, Urine: 1.028 (ref 1.005–1.030)
pH: 5 (ref 5.0–8.0)

## 2021-11-05 LAB — CBC WITH DIFFERENTIAL/PLATELET
Abs Immature Granulocytes: 0.04 10*3/uL (ref 0.00–0.07)
Basophils Absolute: 0.1 10*3/uL (ref 0.0–0.1)
Basophils Relative: 1 %
Eosinophils Absolute: 0.1 10*3/uL (ref 0.0–0.5)
Eosinophils Relative: 2 %
HCT: 30.9 % — ABNORMAL LOW (ref 39.0–52.0)
Hemoglobin: 10.6 g/dL — ABNORMAL LOW (ref 13.0–17.0)
Immature Granulocytes: 1 %
Lymphocytes Relative: 22 %
Lymphs Abs: 1.1 10*3/uL (ref 0.7–4.0)
MCH: 31.7 pg (ref 26.0–34.0)
MCHC: 34.3 g/dL (ref 30.0–36.0)
MCV: 92.5 fL (ref 80.0–100.0)
Monocytes Absolute: 0.5 10*3/uL (ref 0.1–1.0)
Monocytes Relative: 9 %
Neutro Abs: 3.4 10*3/uL (ref 1.7–7.7)
Neutrophils Relative %: 65 %
Platelets: 179 10*3/uL (ref 150–400)
RBC: 3.34 MIL/uL — ABNORMAL LOW (ref 4.22–5.81)
RDW: 13.6 % (ref 11.5–15.5)
WBC: 5.2 10*3/uL (ref 4.0–10.5)
nRBC: 0 % (ref 0.0–0.2)

## 2021-11-05 LAB — TROPONIN I (HIGH SENSITIVITY)
Troponin I (High Sensitivity): 65 ng/L — ABNORMAL HIGH (ref <18)
Troponin I (High Sensitivity): 69 ng/L — ABNORMAL HIGH (ref <18)

## 2021-11-05 LAB — COMPREHENSIVE METABOLIC PANEL
ALT: 13 U/L (ref 0–44)
AST: 16 U/L (ref 15–41)
Albumin: 3.4 g/dL — ABNORMAL LOW (ref 3.5–5.0)
Alkaline Phosphatase: 64 U/L (ref 38–126)
Anion gap: 8 (ref 5–15)
BUN: 41 mg/dL — ABNORMAL HIGH (ref 8–23)
CO2: 21 mmol/L — ABNORMAL LOW (ref 22–32)
Calcium: 9.3 mg/dL (ref 8.9–10.3)
Chloride: 107 mmol/L (ref 98–111)
Creatinine, Ser: 1.7 mg/dL — ABNORMAL HIGH (ref 0.61–1.24)
GFR, Estimated: 40 mL/min — ABNORMAL LOW (ref >60)
Glucose, Bld: 215 mg/dL — ABNORMAL HIGH (ref 70–99)
Potassium: 4 mmol/L (ref 3.5–5.1)
Sodium: 136 mmol/L (ref 135–145)
Total Bilirubin: 0.8 mg/dL (ref 0.3–1.2)
Total Protein: 6.7 g/dL (ref 6.5–8.1)

## 2021-11-05 LAB — BRAIN NATRIURETIC PEPTIDE: B Natriuretic Peptide: 139.3 pg/mL — ABNORMAL HIGH (ref 0.0–100.0)

## 2021-11-05 LAB — LIPASE, BLOOD: Lipase: 41 U/L (ref 11–51)

## 2021-11-05 LAB — D-DIMER, QUANTITATIVE: D-Dimer, Quant: 1.08 ug/mL-FEU — ABNORMAL HIGH (ref 0.00–0.50)

## 2021-11-05 LAB — MAGNESIUM: Magnesium: 1.9 mg/dL (ref 1.7–2.4)

## 2021-11-05 MED ORDER — PANTOPRAZOLE SODIUM 40 MG PO TBEC
40.0000 mg | DELAYED_RELEASE_TABLET | Freq: Every day | ORAL | Status: DC
  Administered 2021-11-06 – 2021-11-08 (×3): 40 mg via ORAL
  Filled 2021-11-05 (×3): qty 1

## 2021-11-05 MED ORDER — GABAPENTIN 300 MG PO CAPS
300.0000 mg | ORAL_CAPSULE | Freq: Every day | ORAL | Status: DC
  Administered 2021-11-06 – 2021-11-07 (×3): 300 mg via ORAL
  Filled 2021-11-05 (×3): qty 1

## 2021-11-05 MED ORDER — INSULIN ASPART 100 UNIT/ML IJ SOLN
0.0000 [IU] | INTRAMUSCULAR | Status: DC
  Administered 2021-11-06: 3 [IU] via SUBCUTANEOUS
  Administered 2021-11-06: 2 [IU] via SUBCUTANEOUS
  Administered 2021-11-06: 3 [IU] via SUBCUTANEOUS
  Administered 2021-11-06: 1 [IU] via SUBCUTANEOUS
  Administered 2021-11-06: 5 [IU] via SUBCUTANEOUS
  Administered 2021-11-06 – 2021-11-07 (×2): 2 [IU] via SUBCUTANEOUS
  Administered 2021-11-07: 3 [IU] via SUBCUTANEOUS
  Administered 2021-11-07: 1 [IU] via SUBCUTANEOUS
  Administered 2021-11-07 (×2): 2 [IU] via SUBCUTANEOUS

## 2021-11-05 MED ORDER — ENOXAPARIN SODIUM 40 MG/0.4ML IJ SOSY
40.0000 mg | PREFILLED_SYRINGE | INTRAMUSCULAR | Status: DC
  Administered 2021-11-06 – 2021-11-08 (×3): 40 mg via SUBCUTANEOUS
  Filled 2021-11-05 (×3): qty 0.4

## 2021-11-05 MED ORDER — ALBUTEROL SULFATE (2.5 MG/3ML) 0.083% IN NEBU: 3.0000 mL | INHALATION_SOLUTION | RESPIRATORY_TRACT | Status: DC | PRN

## 2021-11-05 MED ORDER — ALPRAZOLAM 0.5 MG PO TABS
0.5000 mg | ORAL_TABLET | Freq: Every day | ORAL | Status: DC
  Administered 2021-11-06 – 2021-11-07 (×3): 0.5 mg via ORAL
  Filled 2021-11-05: qty 2
  Filled 2021-11-05 (×2): qty 1

## 2021-11-05 MED ORDER — ONDANSETRON HCL 4 MG/2ML IJ SOLN: 4.0000 mg | Freq: Four times a day (QID) | INTRAMUSCULAR | Status: DC | PRN

## 2021-11-05 MED ORDER — ACETAMINOPHEN 325 MG PO TABS: 650.0000 mg | ORAL_TABLET | ORAL | Status: DC | PRN

## 2021-11-05 MED ORDER — NOREPINEPHRINE 4 MG/250ML-% IV SOLN: 0.0000 ug/min | INTRAVENOUS | Status: DC

## 2021-11-05 MED ORDER — ASPIRIN EC 81 MG PO TBEC
81.0000 mg | DELAYED_RELEASE_TABLET | Freq: Every day | ORAL | Status: DC
  Administered 2021-11-06 – 2021-11-08 (×3): 81 mg via ORAL
  Filled 2021-11-05 (×3): qty 1

## 2021-11-05 NOTE — ED Notes (Signed)
PT ambulatory to BR without assistance.  Pt states weakness is no worse than usual.  Pt reports feeling SHOB, using his inhaler and having no change.  Pt reports that breathing is back to normal at this time. ?

## 2021-11-05 NOTE — Consult Note (Addendum)
?Cardiology Consultation:  ? ?Patient ID: Zachary Nolan ?MRN: [id number]; DOB: 12/14/1940 ? ?Admit date: 11/05/2021 ?Date of Consult: 11/05/2021 ? ?PCP:  Deon Pilling, NP ?  ?[facility] Providers ?Cardiologist:  Dr. Marlou Porch ? ? ?Patient Profile:  ? ?Zachary Nolan is a 81 y.o. male with a hx of CAD RCA STEMI 2011 with residual LAD and Cx dz, ICM EF 35-40% recovered, DM, HTN, OSA who is being seen 11/05/2021 for the evaluation of elevated trop at the request of PA Abigal. ? ?History of Present Illness:  ? ?Mr. Zachary Nolan states he was in his normal state of health when last afternoon he became acutely short of breath.  It was gradual onset over few minutes lasted for an hour and then resolved.  He had called 911 before it resolved but by the time EMS arrived he was back to baseline.  He denies chest pain, DOE preceding this event, edema, orthopnea, syncope.  As he was otherwise doing well. He is completely asx at this time. Has not needed nitro anytime recently.  ? ?In the ED he was afebrile normal pulse, BP 125/77 saturating 97% on room air. ECG showed NSR, PVCs, otherwise normal Hgb 10.6, Crt 1.7, BNP 139, trop 65 and 69. D -dimer 1.08.  He was admitted to hospitalist for further management. He is having frequent PVCs on tele.  ? ?Last stress was 2016 and was neg for inducible ischemia. ECHO 2017 with recovered EF 55-60% with no significant valvular abnormalities.  ? ?Past Medical History:  ?Diagnosis Date  ? CAD (coronary artery disease)   ? a. LHC (7/11):  Inf STEMI >>> inf AK, EF 35-40%, LAD 70-75%, mid CFX 70%, dist RCA 99% >>> PCI:  3.5 x 28 mm Promus DES to RCA;    ? Cancer [facility])   ? skin  ? Depression   ? Diabetes mellitus   ? Non-insulin-dependent diabetes mellitus.   ? DJD (degenerative joint disease)   ? HTN (hypertension)   ? Hx of cardiovascular stress test   ? a. Nuclear (3/14):  Small inf defect - likely scar; no ischemia, EF 59%; LOW RISK;  b. Lexiscan Myoview (1/16): No ischemia, fixed inferior defect consistent  with prior infarct versus diaphragmatic attenuation, EF 57%, Low Risk  ? Hyperlipemia   ? Ischemic cardiomyopathy   ? EF 35-40% at time of MI in 2011 >> improved to normal on Nuclear study in 2014  ? OSA (obstructive sleep apnea) 09/10/2014  ? severe with AHI 68/hr  ? RLS (restless legs syndrome)   ? Seasonal allergies   ? ? ?Past Surgical History:  ?Procedure Laterality Date  ? COLONOSCOPY    ? NOSE SURGERY    ? Percutaneous coronary intervention using a drug-eluting stent (Promus)    ? Moderately    severe left anterior descending stenosis.  Moderate left     circumflex stenosis, moderate left ventricular dysfunction with   left  ventricular ejection fraction of 35% to 40%.   ? Release of left transcarpal ligament.    ? Release of right transcarpal ligament.    ? TONSILLECTOMY    ?  ? ?Home Medications:  ?Prior to Admission medications   ?Medication Sig Start Date End Date Taking? Authorizing Provider  ?acetaminophen (TYLENOL) 650 MG CR tablet Take 650-1,300 mg by mouth every 8 (eight) hours as needed for pain.   Yes [provider]  ?ALPRAZolam Duanne Moron) 0.5 MG tablet Take 0.5 mg by mouth at bedtime.   Yes [provider]  ?  gabapentin (NEURONTIN) 300 MG capsule Take 300 mg by mouth at bedtime. 10/22/21  Yes [provider]  ?JARDIANCE 10 MG TABS tablet Take 10 mg by mouth daily. 08/23/21  Yes [provider]  ?metFORMIN (GLUCOPHAGE) 1000 MG tablet Take 1,000 mg by mouth 2 (two) times daily. 07/27/21  Yes [provider]  ?nitroGLYCERIN (NITROSTAT) 0.4 MG SL tablet Place 0.4 mg under the tongue every 5 (five) minutes as needed for chest pain.   Yes [provider]  ?omeprazole (PRILOSEC) 20 MG capsule Take 20 mg by mouth daily as needed (heartburn/acid reflux).   Yes [provider]  ?VENTOLIN HFA 108 (90 Base) MCG/ACT inhaler Inhale 2 puffs into the lungs every 4 (four) hours as needed for wheezing or shortness of breath. 08/27/15  Yes [provider]  ?aspirin EC 81 MG tablet Take 1 tablet (81 mg total) by mouth daily. Swallow whole. ?Patient not taking: Reported on 11/05/2021 08/31/21   Jerline Pain, MD  ?ezetimibe (ZETIA) 10 MG tablet Take 10 mg by mouth daily.    [provider]  ?rosuvastatin (CRESTOR) 10 MG tablet Take 1 tablet (10 mg total) by mouth daily. ?Patient not taking: Reported on 11/05/2021 08/31/21 08/31/22  Jerline Pain, MD  ? ? ?Inpatient Medications: ?Scheduled Meds: ? ALPRAZolam  0.5 mg Oral QHS  ? [START ON 11/06/2021] aspirin EC  81 mg Oral Daily  ? gabapentin  300 mg Oral QHS  ? [START ON 11/06/2021] insulin aspart  0-9 Units Subcutaneous Q4H  ? [START ON 11/06/2021] pantoprazole  40 mg Oral Daily  ? ?Continuous Infusions: ? ?PRN Meds: ?albuterol ? ?Allergies:    ?Allergies  ?Allergen Reactions  ? Dust Mite Extract   ?  Stuffy head  ? Pollen Extract   ?  Stuffy head  ? Shellfish-Derived Products Swelling  ?  JUST CRAB MEAT  ? Statins Other (See Comments)  ?  Dizziness and myalgias  ? ? ?Social History:   ?Social History  ? ?Socioeconomic History  ? Marital status: Married  ?  Spouse name: Not on file  ? Number of children: Not on file  ? Years of education: Not on file  ? Highest education level: Not on file  ?Occupational History  ? Not on file  ?Tobacco Use  ? Smoking status: Former  ?  Types: Cigarettes  ?  Quit date: 1968  ?  Years since quitting: 55.3  ? Smokeless tobacco: Never  ?Vaping Use  ? Vaping Use: Never used  ?Substance and Sexual Activity  ? Alcohol use: No  ? Drug use: No  ? Sexual activity: Not on file  ?Other Topics Concern  ? Not on file  ?Social History Narrative  ? Not on file  ? ?Social Determinants of Health  ? ?Financial Resource Strain: Not on file  ?Food Insecurity: Not on file  ?Transportation Needs: Not on file  ?Physical Activity: Not on file  ?Stress: Not on file  ?Social Connections: Not on file  ?Intimate Partner Violence: Not on file  ?  ?Family History:   ? ?Family History  ?Problem Relation Age of  Onset  ? Hypertension Mother   ? Asthma Mother   ? Heart attack Father   ? Hypertension Father   ? Diabetes Father   ? Coronary artery disease Unknown   ? Heart attack Brother   ? Hypertension Brother   ? Stroke Neg Hx   ?  ? ?ROS:  ?Please see the history of  present illness.  ?All other ROS reviewed and negative.    ? ?Physical Exam/Data:  ? ?Vitals:  ? 11/05/21 1741 11/05/21 1800 11/05/21 1815 11/05/21 1845  ?BP:  113/76 117/73 132/72  ?Pulse:  83 81 80  ?Resp:   14 13  ?Temp:      ?TempSrc:      ?SpO2:  97% 98% 98%  ?Weight: 88.9 kg     ?Height: '5\' 8"'$  (1.727 m)     ? ?No intake or output data in the 24 hours ending 11/05/21 2347 ? ?  11/05/2021  ?  5:41 PM 08/31/2021  ?  2:20 PM 02/21/2018  ? 10:21 AM  ?Last 3 Weights  ?Weight (lbs) 196 lb 202 lb 12.8 oz 199 lb 14.4 oz  ?Weight (kg) 88.905 kg 91.989 kg 90.674 kg  ?   ?Body mass index is 29.8 kg/m?.  ?General:  Well nourished, well developed, in no acute distress ?HEENT: normal ?Neck: no JVD ?Vascular: No carotid bruits; Distal pulses 2+ bilaterally ?Cardiac:  normal S1, S2; RRR; no murmur  ?Lungs:  clear to auscultation bilaterally, no wheezing, rhonchi or rales  ?Abd: soft, nontender, no hepatomegaly  ?Ext: no edema ?Musculoskeletal:  No deformities, BUE and BLE strength normal and equal ?Skin: warm and dry  ?Neuro:  CNs 2-12 intact, no focal abnormalities noted ?Psych:  Normal affect  ? ?EKG:  The EKG was personally reviewed and demonstrates:  NSR, PVCs, otherwise normal ?Telemetry:  Telemetry was personally reviewed and demonstrates:  NSR, PVC ? ?Relevant CV Studies: ?ECHO 2017 ?Study Conclusions  ? ?- Left ventricle: The cavity size was normal. Wall thickness was  ?  normal. Systolic function was normal. The estimated ejection  ?  fraction was in the range of 55% to 60%. Wall motion was normal;  ?  there were no regional wall motion abnormalities. There was an  ?  increased relative contribution of atrial contraction to  ?  ventricular filling. Doppler parameters  are consistent with  ?  abnormal left ventricular relaxation (grade 1 diastolic  ?  dysfunction).  ?- Aortic valve: There was trivial regurgitation.  ?- Pulmonary arteries: PA peak pressure: 31 mm Hg (S)

## 2021-11-05 NOTE — ED Provider Notes (Deleted)
?[place] ?Provider Note ? ? ?CSN: [id number] ?Arrival date & time: 11/05/21  1729 ? ?  ? ?History ? ?Chief Complaint  ?Patient presents with  ? Weakness  ? ? ?Zachary Nolan is a 81 y.o. male. ? ?HPI ?disregard this blank note.  To be deleted.  Full note dictated by Francee Piccolo ?  ? ?Home Medications ?Prior to Admission medications   ?Medication Sig Start Date End Date Taking? Authorizing Provider  ?ALPRAZolam (XANAX) 0.5 MG tablet Take 0.5 mg by mouth daily.    [provider]  ?aspirin EC 81 MG tablet Take 1 tablet (81 mg total) by mouth daily. Swallow whole. 08/31/21   Jerline Pain, MD  ?Cyanocobalamin (VITAMIN B 12 PO) Take 1,000 mg by mouth daily.    [provider]  ?escitalopram (LEXAPRO) 10 MG tablet Take 10 mg by mouth daily. 08/23/21   [provider]  ?ezetimibe (ZETIA) 10 MG tablet Take 5-10 mg by mouth daily.    [provider]  ?glimepiride (AMARYL) 2 MG tablet Take 2 mg by mouth daily before breakfast.  ?Patient not taking: Reported on 08/31/2021    [provider]  ?hydrochlorothiazide (HYDRODIURIL) 25 MG tablet Take 25 mg by mouth daily.    [provider]  ?ibuprofen (ADVIL,MOTRIN) 200 MG tablet Take 200 mg by mouth every 8 (eight) hours as needed. ?Patient not taking: Reported on 08/31/2021    [provider]  ?JARDIANCE 10 MG TABS tablet Take 10 mg by mouth daily. 08/23/21   [provider]  ?L-Methylfolate-Algae-B12-B6 Glade Stanford) 3-90.314-2-35 MG CAPS Take 1 capsule by mouth daily.    [provider]  ?lisinopril (PRINIVIL,ZESTRIL) 20 MG tablet Take 20 mg by mouth 2 (two) times daily.    [provider]  ?metFORMIN (GLUCOPHAGE) 500 MG tablet Take 500 mg by mouth 2 (two) times daily with a meal.     [provider]  ?Multiple Vitamin (MULTIVITAMIN) capsule Take 1 capsule by mouth daily.      [provider]  ?nitroGLYCERIN (NITROSTAT) 0.4 MG SL tablet Place 0.4  mg under the tongue every 5 (five) minutes as needed for chest pain.    [provider]  ?Omega-3 Fatty Acids (FISH OIL) 1000 MG CAPS Take 1 capsule by mouth 2 (two) times daily.    [provider]  ?omeprazole (PRILOSEC) 20 MG capsule Take 20 mg by mouth daily.    [provider]  ?pioglitazone (ACTOS) 15 MG tablet Take 15 mg by mouth daily.    [provider]  ?predniSONE (DELTASONE) 10 MG tablet Take by mouth. 08/23/21   [provider]  ?rosuvastatin (CRESTOR) 10 MG tablet Take 1 tablet (10 mg total) by mouth daily. 08/31/21 08/31/22  Jerline Pain, MD  ?traMADol Veatrice Bourbon) 50 MG tablet 1 tablet as needed for pain 03/21/19   [provider]  ?VENTOLIN HFA 108 (90 Base) MCG/ACT inhaler Inhale 2 puffs into the lungs every 4 (four) hours as needed. Wheezing or shortness of breath 08/27/15   [provider]  ?   ? ?Allergies    ?Dust mite extract, Pollen extract, Shellfish-derived products, and Statins   ? ?Review of Systems   ?Review of Systems ? ?Physical Exam ?Updated Vital Signs ?BP 113/76   Pulse 83   Temp 97.7 ?F (36.5 ?C) (Oral)   Resp 18   Ht '5\' 8"'$  (1.727 m)   Wt 88.9 kg   SpO2 97%   BMI 29.80  kg/m?  ?Physical Exam ? ?ED Results / Procedures / Treatments   ?Labs ?(all labs ordered are listed, but only abnormal results are displayed) ?Labs Reviewed  ?CBC WITH DIFFERENTIAL/PLATELET  ?COMPREHENSIVE METABOLIC PANEL  ?LIPASE, BLOOD  ?URINALYSIS, ROUTINE W REFLEX MICROSCOPIC  ?MAGNESIUM  ?BRAIN NATRIURETIC PEPTIDE  ?TROPONIN I (HIGH SENSITIVITY)  ? ? ?EKG ?None ? ?Radiology ?No results found. ? ?Procedures ?Procedures  ? ? ?Medications Ordered in ED ?Medications - No data to display ? ?ED Course/ Medical Decision Making/ A&P ?Clinical Course as of 11/09/21 1453  ?Fri Nov 05, 2021  ?2058 Troponin I (High Sensitivity)(!): 65 [AH]  ?2134 Discussed the case with Dr. Alcario Drought, if Troponin is elevating will defer to cards admission. [AH]  ?  ?Clinical Course  User Index ?[AH] Margarita Mail, PA-C  ? ?                        ?Medical Decision Making ?Amount and/or Complexity of Data Reviewed ?Labs: ordered. Decision-making details documented in ED Course. ?Radiology: ordered. ?ECG/medicine tests: ordered. ? ?Risk ?Decision regarding hospitalization. ? ? ? ? ? ? ? ? ? ? ?Final Clinical Impression(s) / ED Diagnoses ?Final diagnoses:  ?None  ? ? ?Rx / DC Orders ?ED Discharge Orders   ? ? None  ? ?  ? ? ?  ?Charlesetta Shanks, MD ?11/09/21 1454 ? ?

## 2021-11-05 NOTE — Assessment & Plan Note (Signed)
1. Hold home metformin and jardiance ?2. Sensitive SSI Q4H ?

## 2021-11-05 NOTE — Assessment & Plan Note (Addendum)
Recently taken off of BP meds due to hypotension ?

## 2021-11-05 NOTE — ED Provider Notes (Signed)
?[place] ?Provider Note ? ? ?CSN: [id number] ?Arrival date & time: 11/05/21  1729 ? ?  ? ?History ? ?Chief Complaint  ?Patient presents with  ? Weakness  ? ? ?Zachary Nolan is a 81 y.o. male with a past medical history of CAD, previous MI status post drug-eluting stent placement of the RCA in 2011.  He also has a history of ischemic cardiomyopathy with an EF of 35 to 40%.  Has a history of OSA, diabetes and statin intolerance with hyperlipidemia.  Patient states that he has chronic shortness of breath which he attributes to deconditioning due to severe systemic arthritis.  He states that he is very inactive at baseline.  Today he was seated in his chair watching television when he had onset of shortness of breath.  He states that he got extremely winded and had trouble catching his breath.  Patient states that this lasted approximately 45 minutes and by the time he got to the hospital he was feeling better.  He denies active chest pain, nausea, diaphoresis.  He denies episodes of exertional dyspnea or exertional chest pain prior to this event.  He does not feel short of breath at this time. ? ?81 y/o male with pmh ? ? ?Weakness ? ?  ? ?Home Medications ?Prior to Admission medications   ?Medication Sig Start Date End Date Taking? Authorizing Provider  ?acetaminophen (TYLENOL) 650 MG CR tablet Take 650-1,300 mg by mouth every 8 (eight) hours as needed for pain.   Yes [provider]  ?ALPRAZolam Duanne Moron) 0.5 MG tablet Take 0.5 mg by mouth at bedtime.   Yes [provider]  ?gabapentin (NEURONTIN) 300 MG capsule Take 300 mg by mouth at bedtime. 10/22/21  Yes [provider]  ?JARDIANCE 10 MG TABS tablet Take 10 mg by mouth daily. 08/23/21  Yes [provider]  ?metFORMIN (GLUCOPHAGE) 1000 MG tablet Take 1,000 mg by mouth 2 (two) times daily. 07/27/21  Yes [provider]  ?nitroGLYCERIN (NITROSTAT) 0.4 MG SL tablet Place 0.4 mg under the  tongue every 5 (five) minutes as needed for chest pain.   Yes [provider]  ?omeprazole (PRILOSEC) 20 MG capsule Take 20 mg by mouth daily as needed (heartburn/acid reflux).   Yes [provider]  ?VENTOLIN HFA 108 (90 Base) MCG/ACT inhaler Inhale 2 puffs into the lungs every 4 (four) hours as needed for wheezing or shortness of breath. 08/27/15  Yes [provider]  ?aspirin EC 81 MG tablet Take 1 tablet (81 mg total) by mouth daily. Swallow whole. ?Patient not taking: Reported on 11/05/2021 08/31/21   Jerline Pain, MD  ?ezetimibe (ZETIA) 10 MG tablet Take 10 mg by mouth daily.    [provider]  ?rosuvastatin (CRESTOR) 10 MG tablet Take 1 tablet (10 mg total) by mouth daily. ?Patient not taking: Reported on 11/05/2021 08/31/21 08/31/22  Jerline Pain, MD  ?   ? ?Allergies    ?Dust mite extract, Pollen extract, Shellfish-derived products, and Statins   ? ?Review of Systems   ?Review of Systems  ?Neurological:  Positive for weakness.  ? ?Physical Exam ?Updated Vital Signs ?BP 118/75 (BP Location: Right Arm)   Pulse 77   Temp 97.6 ?F (36.4 ?C) (Oral)   Resp (!) 6   Ht '5\' 8"'$  (1.727 m)   Wt 89.3 kg   SpO2 98%   BMI 29.93 kg/m?  ?Physical Exam ?Vitals and nursing note reviewed.  ?Constitutional:   ?  General: He is not in acute distress. ?   Appearance: He is well-developed. He is not diaphoretic.  ?HENT:  ?   Head: Normocephalic and atraumatic.  ?Eyes:  ?   General: No scleral icterus. ?   Conjunctiva/sclera: Conjunctivae normal.  ?Cardiovascular:  ?   Rate and Rhythm: Normal rate. Rhythm irregular.  ?   Heart sounds: Normal heart sounds.  ?   Comments: Patient's heart rate is waffling between the 80s and the 110s.  He is having episodes of PVCs, no evidence of atrial fibrillation with regular rhythm ?Pulmonary:  ?   Effort: Pulmonary effort is normal. No respiratory distress.  ?   Breath sounds: Normal breath sounds.  ?Abdominal:  ?   Palpations: Abdomen is soft.  ?    Tenderness: There is no abdominal tenderness.  ?Musculoskeletal:  ?   Cervical back: Normal range of motion and neck supple.  ?Skin: ?   General: Skin is warm and dry.  ?Neurological:  ?   Mental Status: He is alert.  ?Psychiatric:     ?   Behavior: Behavior normal.  ? ? ?ED Results / Procedures / Treatments   ?Labs ?(all labs ordered are listed, but only abnormal results are displayed) ?Labs Reviewed  ?CBC WITH DIFFERENTIAL/PLATELET - Abnormal; Notable for the following components:  ?    Result Value  ? RBC 3.34 (*)   ? Hemoglobin 10.6 (*)   ? HCT 30.9 (*)   ? All other components within normal limits  ?COMPREHENSIVE METABOLIC PANEL - Abnormal; Notable for the following components:  ? CO2 21 (*)   ? Glucose, Bld 215 (*)   ? BUN 41 (*)   ? Creatinine, Ser 1.70 (*)   ? Albumin 3.4 (*)   ? GFR, Estimated 40 (*)   ? All other components within normal limits  ?URINALYSIS, ROUTINE W REFLEX MICROSCOPIC - Abnormal; Notable for the following components:  ? Glucose, UA >=500 (*)   ? Bacteria, UA RARE (*)   ? All other components within normal limits  ?BRAIN NATRIURETIC PEPTIDE - Abnormal; Notable for the following components:  ? B Natriuretic Peptide 139.3 (*)   ? All other components within normal limits  ?D-DIMER, QUANTITATIVE - Abnormal; Notable for the following components:  ? D-Dimer, Quant 1.08 (*)   ? All other components within normal limits  ?GLUCOSE, CAPILLARY - Abnormal; Notable for the following components:  ? Glucose-Capillary 171 (*)   ? All other components within normal limits  ?HEMOGLOBIN A1C - Abnormal; Notable for the following components:  ? Hgb A1c MFr Bld 11.2 (*)   ? All other components within normal limits  ?CBG MONITORING, ED - Abnormal; Notable for the following components:  ? Glucose-Capillary 211 (*)   ? All other components within normal limits  ?TROPONIN I (HIGH SENSITIVITY) - Abnormal; Notable for the following components:  ? Troponin I (High Sensitivity) 65 (*)   ? All other components within  normal limits  ?TROPONIN I (HIGH SENSITIVITY) - Abnormal; Notable for the following components:  ? Troponin I (High Sensitivity) 69 (*)   ? All other components within normal limits  ?LIPASE, BLOOD  ?MAGNESIUM  ? ? ?EKG ?EKG Interpretation ? ?Date/Time:  Friday Nov 05 2021 18:52:18 EDT ?Ventricular Rate:  86 ?PR Interval:  208 ?QRS Duration: 82 ?QT Interval:  364 ?QTC Calculation: 435 ?R Axis:   69 ?Text Interpretation: Sinus rhythm with occasional Premature ventricular complexes Otherwise normal ECG When compared with ECG of 19-Jul-2017 19:32,  PREVIOUS ECG IS PRESENT no sig change from previous Confirmed by Charlesetta Shanks [phone]) on 11/05/2021 9:03:28 PM ? ?Radiology ?DG Chest 2 View ? ?Result Date: 11/05/2021 ?CLINICAL DATA:  Generalized weakness and shortness of breath. EXAM: CHEST - 2 VIEW COMPARISON:  August 23, 2012 FINDINGS: The heart size and mediastinal contours are within normal limits. Mildly decreased lung volumes are seen. Both lungs are clear. A stable, benign-appearing sclerotic focus is seen within the proximal right humeral shaft. Degenerative changes are seen involving the left shoulder. IMPRESSION: No active cardiopulmonary disease. Electronically Signed   By: Virgina Norfolk M.D.   On: 11/05/2021 19:00   ? ?Procedures ?Procedures  ? ? ?Medications Ordered in ED ?Medications  ?ALPRAZolam Duanne Moron) tablet 0.5 mg (0.5 mg Oral Given 11/06/21 0025)  ?aspirin EC tablet 81 mg (has no administration in time range)  ?gabapentin (NEURONTIN) capsule 300 mg (300 mg Oral Given 11/06/21 0025)  ?insulin aspart (novoLOG) injection 0-9 Units (2 Units Subcutaneous Given 11/06/21 0444)  ?pantoprazole (PROTONIX) EC tablet 40 mg (has no administration in time range)  ?acetaminophen (TYLENOL) tablet 650 mg (has no administration in time range)  ?ondansetron (ZOFRAN) injection 4 mg (has no administration in time range)  ?enoxaparin (LOVENOX) injection 40 mg (has no administration in time range)  ?ipratropium-albuterol  (DUONEB) 0.5-2.5 (3) MG/3ML nebulizer solution 3 mL (has no administration in time range)  ?hydrALAZINE (APRESOLINE) injection 10 mg (has no administration in time range)  ?metoprolol tartrate (LOPRESSOR) injection 5 mg

## 2021-11-05 NOTE — Assessment & Plan Note (Addendum)
Renal function today looks like it may be his baseline: Creat was 1.71 with BUN of 29 in Feb of this year as well (creat is again 1.7 today). ?Creat was 1.4 in Aug 2022. ?

## 2021-11-05 NOTE — Assessment & Plan Note (Addendum)
Unable to tolerate statins it sounds like. ?Cholesterol didn't look good in Feb this year at PCPs (HDL 39 LDL of 192) and LDL was 201 in Aug 2022 ?1. Checking lipid profile ?2. Continue zetia ?3. Sounds like leg weakness with statin in past caused him to stop it, cards tried resuming in March, but pt says he wasn't able to tolerate again due to BLE weakness with the med. ?4. Sounds like cards may want to consider PCSK9? ?

## 2021-11-05 NOTE — Assessment & Plan Note (Addendum)
DDx anginal equivalent vs just asthma / allergies. ?1. CP obs pathway ?2. trops slightly positive but flat ?3. NPO after MN ?4. Starting back on ASA 81 daily for the moment ?5. Tele monitor ?6. CP free at the moment ?7. Cards seeing in consult ?

## 2021-11-05 NOTE — H&P (Signed)
?History and Physical  ? ? ?Patient: Zachary Nolan HKV:[id number] DOB: 08/20/1940 ?DOA: 11/05/2021 ?DOS: the patient was seen and examined on 11/05/2021 ?PCP: Deon Pilling, NP  ?Patient coming from: Home ? ?Chief Complaint:  ?Chief Complaint  ?Patient presents with  ? Weakness  ? ?HPI: Zachary Nolan is a 81 y.o. male with medical history significant of DM2, HTN, HLD, OSA, CAD. ? ?Has coronary artery disease status post inferior STEMI in 2011 with drug-eluting stent to the RCA.  Had ischemic cardiomyopathy with EF 35 to 40% at that time which improved by nuclear study in 2014.  He also has diabetes hypertension hyperlipidemia.  Has had statin intolerance in the past and has not been on a beta-blocker because of bradycardia. ? ?Cholesterol's have not looked good recently with HDL 39 and LDL 192 in Feb 2022.  PCP started him on zetia, Dr. Marlou Porch tried to restart statin but pt says he wasn't able to tolerate statin. ? ?Pt has chronic SOB that he attributes to sedentary lifestyle at baseline. ? ?Today he was seated in his chair watching television when he had onset of shortness of breath.  He states that he got extremely winded and had trouble catching his breath.  Patient states that this lasted approximately 45 minutes and by the time he got to the hospital he was feeling better.  He denies active chest pain, nausea, diaphoresis.  He denies episodes of exertional dyspnea or exertional chest pain prior to this event.  He does not feel short of breath at this time. ? ?Review of Systems: As mentioned in the history of present illness. All other systems reviewed and are negative. ?Past Medical History:  ?Diagnosis Date  ? CAD (coronary artery disease)   ? a. LHC (7/11):  Inf STEMI >>> inf AK, EF 35-40%, LAD 70-75%, mid CFX 70%, dist RCA 99% >>> PCI:  3.5 x 28 mm Promus DES to RCA;    ? Cancer [facility])   ? skin  ? Depression   ? Diabetes mellitus   ? Non-insulin-dependent diabetes mellitus.   ? DJD (degenerative joint disease)   ? HTN  (hypertension)   ? Hx of cardiovascular stress test   ? a. Nuclear (3/14):  Small inf defect - likely scar; no ischemia, EF 59%; LOW RISK;  b. Lexiscan Myoview (1/16): No ischemia, fixed inferior defect consistent with prior infarct versus diaphragmatic attenuation, EF 57%, Low Risk  ? Hyperlipemia   ? Ischemic cardiomyopathy   ? EF 35-40% at time of MI in 2011 >> improved to normal on Nuclear study in 2014  ? OSA (obstructive sleep apnea) 09/10/2014  ? severe with AHI 68/hr  ? RLS (restless legs syndrome)   ? Seasonal allergies   ? ?Past Surgical History:  ?Procedure Laterality Date  ? COLONOSCOPY    ? NOSE SURGERY    ? Percutaneous coronary intervention using a drug-eluting stent (Promus)    ? Moderately    severe left anterior descending stenosis.  Moderate left     circumflex stenosis, moderate left ventricular dysfunction with   left  ventricular ejection fraction of 35% to 40%.   ? Release of left transcarpal ligament.    ? Release of right transcarpal ligament.    ? TONSILLECTOMY    ? ?Social History:  reports that he quit smoking about 55 years ago. His smoking use included cigarettes. He has never used smokeless tobacco. He reports that he does not drink alcohol and does not use drugs. ? ?Allergies  ?Allergen  Reactions  ? Dust Mite Extract   ?  Stuffy head  ? Pollen Extract   ?  Stuffy head  ? Shellfish-Derived Products Swelling  ?  JUST CRAB MEAT  ? Statins Other (See Comments)  ?  Dizziness and myalgias  ? ? ?Family History  ?Problem Relation Age of Onset  ? Hypertension Mother   ? Asthma Mother   ? Heart attack Father   ? Hypertension Father   ? Diabetes Father   ? Coronary artery disease Unknown   ? Heart attack Brother   ? Hypertension Brother   ? Stroke Neg Hx   ? ? ?Prior to Admission medications   ?Medication Sig Start Date End Date Taking? Authorizing Provider  ?acetaminophen (TYLENOL) 650 MG CR tablet Take 650-1,300 mg by mouth every 8 (eight) hours as needed for pain.   Yes [provider]  ?ALPRAZolam Duanne Moron) 0.5 MG tablet Take 0.5 mg by mouth at bedtime.   Yes [provider]  ?gabapentin (NEURONTIN) 300 MG capsule Take 300 mg by mouth at bedtime. 10/22/21  Yes [provider]  ?JARDIANCE 10 MG TABS tablet Take 10 mg by mouth daily. 08/23/21  Yes [provider]  ?metFORMIN (GLUCOPHAGE) 1000 MG tablet Take 1,000 mg by mouth 2 (two) times daily. 07/27/21  Yes [provider]  ?nitroGLYCERIN (NITROSTAT) 0.4 MG SL tablet Place 0.4 mg under the tongue every 5 (five) minutes as needed for chest pain.   Yes [provider]  ?omeprazole (PRILOSEC) 20 MG capsule Take 20 mg by mouth daily as needed (heartburn/acid reflux).   Yes [provider]  ?VENTOLIN HFA 108 (90 Base) MCG/ACT inhaler Inhale 2 puffs into the lungs every 4 (four) hours as needed for wheezing or shortness of breath. 08/27/15  Yes [provider]  ?aspirin EC 81 MG tablet Take 1 tablet (81 mg total) by mouth daily. Swallow whole. ?Patient not taking: Reported on 11/05/2021 08/31/21   Jerline Pain, MD  ?ezetimibe (ZETIA) 10 MG tablet Take 10 mg by mouth daily.    [provider]  ?rosuvastatin (CRESTOR) 10 MG tablet Take 1 tablet (10 mg total) by mouth daily. ?Patient not taking: Reported on 11/05/2021 08/31/21 08/31/22  Jerline Pain, MD  ? ? ?Physical Exam: ?Vitals:  ? 11/05/21 1741 11/05/21 1800 11/05/21 1815 11/05/21 1845  ?BP:  113/76 117/73 132/72  ?Pulse:  83 81 80  ?Resp:   14 13  ?Temp:      ?TempSrc:      ?SpO2:  97% 98% 98%  ?Weight: 88.9 kg     ?Height: '5\' 8"'$  (1.727 m)     ? ?Constitutional: NAD, calm, comfortable ?Eyes: PERRL, lids and conjunctivae normal ?ENMT: Mucous membranes are moist. Posterior pharynx clear of any exudate or lesions.Normal dentition.  ?Neck: normal, supple, no masses, no thyromegaly ?Respiratory: clear to auscultation bilaterally, no wheezing, no crackles. Normal respiratory effort. No accessory muscle use.  ?Cardiovascular: Regular rate and  rhythm, no murmurs / rubs / gallops. No extremity edema. 2+ pedal pulses. No carotid bruits.  ?Abdomen: no tenderness, no masses palpated. No hepatosplenomegaly. Bowel sounds positive.  ?Musculoskeletal: no clubbing / cyanosis. No joint deformity upper and lower extremities. Good ROM, no contractures. Normal muscle tone.  ?Skin: no rashes, lesions, ulcers. No induration ?Neurologic: CN 2-12 grossly intact. Sensation intact, DTR normal. Strength 5/5 in all 4.  ?Psychiatric: Normal judgment and insight. Alert and oriented x 3. Normal mood.  ? ?Data Reviewed: ? ?Trops 60s  and flat x2 ? ? ?  Latest Ref Rng & Units 11/05/2021  ?  6:21 PM 01/19/2018  ?  9:13 AM 07/19/2017  ?  7:59 PM  ?BMP  ?Glucose 70 - 99 mg/dL 215   153   155    ?BUN 8 - 23 mg/dL 41   26   34    ?Creatinine 0.61 - 1.24 mg/dL 1.70   1.17   1.40    ?BUN/Creat Ratio 10 - 24  22     ?Sodium 135 - 145 mmol/L 136   139   139    ?Potassium 3.5 - 5.1 mmol/L 4.0   4.2   3.9    ?Chloride 98 - 111 mmol/L 107   100   103    ?CO2 22 - 32 mmol/L 21   23     ?Calcium 8.9 - 10.3 mg/dL 9.3   10.1     ? ? ? ?Assessment and Plan: ?* Chest pain, rule out acute myocardial infarction ?DDx anginal equivalent vs just asthma / allergies. ?CP obs pathway ?trops slightly positive but flat ?NPO after MN ?Starting back on ASA 81 daily for the moment ?Tele monitor ?CP free at the moment ?Cards seeing in consult ? ?CKD (chronic kidney disease) stage 3, GFR 30-59 ml/min (HCC) ?Renal function today looks like it may be his baseline: Creat was 1.71 with BUN of 29 in Feb of this year as well (creat is again 1.7 today). ?Creat was 1.4 in Aug 2022. ? ?HYPERCHOLESTEROLEMIA ?Unable to tolerate statins it sounds like. ?Cholesterol didn't look good in Feb this year at PCPs (HDL 39 LDL of 192) and LDL was 201 in Aug 2022 ?Checking lipid profile ?Continue zetia ?Sounds like leg weakness with statin in past caused him to stop it, cards tried resuming in March, but pt says he wasn't able to tolerate  again due to BLE weakness with the med. ?Sounds like cards may want to consider PCSK9? ? ?DM2 (diabetes mellitus, type 2) ([facility]) ?Hold home metformin and jardiance ?Sensitive SSI Q4H ? ?Essential hypert

## 2021-11-05 NOTE — ED Triage Notes (Signed)
Pt to ER via EMS from home with c/o generalized weakness and "not feeling right" since this AM.  Pt reports mild increase in his SHOB.  Pt was seen by PCP on Wednesday and taken off 2 of BP meds due to hypotension.  ?

## 2021-11-06 ENCOUNTER — Observation Stay (HOSPITAL_COMMUNITY): Payer: Medicare HMO

## 2021-11-06 DIAGNOSIS — I428 Other cardiomyopathies: Secondary | ICD-10-CM | POA: Diagnosis not present

## 2021-11-06 DIAGNOSIS — R079 Chest pain, unspecified: Secondary | ICD-10-CM | POA: Diagnosis not present

## 2021-11-06 DIAGNOSIS — R0600 Dyspnea, unspecified: Secondary | ICD-10-CM | POA: Diagnosis not present

## 2021-11-06 LAB — ECHOCARDIOGRAM COMPLETE
AR max vel: 1.16 cm2
AV Area VTI: 1.23 cm2
AV Area mean vel: 1.18 cm2
AV Mean grad: 17 mmHg
AV Peak grad: 29.8 mmHg
Ao pk vel: 2.73 m/s
Area-P 1/2: 2.5 cm2
Height: 68 in
P 1/2 time: 529 msec
S' Lateral: 3.77 cm
Weight: 3149.93 oz

## 2021-11-06 LAB — BASIC METABOLIC PANEL
Anion gap: 5 (ref 5–15)
BUN: 39 mg/dL — ABNORMAL HIGH (ref 8–23)
CO2: 25 mmol/L (ref 22–32)
Calcium: 9.3 mg/dL (ref 8.9–10.3)
Chloride: 107 mmol/L (ref 98–111)
Creatinine, Ser: 1.61 mg/dL — ABNORMAL HIGH (ref 0.61–1.24)
GFR, Estimated: 42 mL/min — ABNORMAL LOW (ref >60)
Glucose, Bld: 113 mg/dL — ABNORMAL HIGH (ref 70–99)
Potassium: 3.6 mmol/L (ref 3.5–5.1)
Sodium: 137 mmol/L (ref 135–145)

## 2021-11-06 LAB — HEMOGLOBIN A1C
Hgb A1c MFr Bld: 11.2 % — ABNORMAL HIGH (ref 4.8–5.6)
Mean Plasma Glucose: 274.74 mg/dL

## 2021-11-06 LAB — CBG MONITORING, ED: Glucose-Capillary: 211 mg/dL — ABNORMAL HIGH (ref 70–99)

## 2021-11-06 LAB — GLUCOSE, CAPILLARY
Glucose-Capillary: 171 mg/dL — ABNORMAL HIGH (ref 70–99)
Glucose-Capillary: 132 mg/dL — ABNORMAL HIGH (ref 70–99)
Glucose-Capillary: 162 mg/dL — ABNORMAL HIGH (ref 70–99)
Glucose-Capillary: 291 mg/dL — ABNORMAL HIGH (ref 70–99)
Glucose-Capillary: 220 mg/dL — ABNORMAL HIGH (ref 70–99)

## 2021-11-06 MED ORDER — MAGNESIUM SULFATE 2 GM/50ML IV SOLN
2.0000 g | Freq: Once | INTRAVENOUS | Status: AC
  Administered 2021-11-06: 2 g via INTRAVENOUS
  Filled 2021-11-06: qty 50

## 2021-11-06 MED ORDER — METOPROLOL TARTRATE 12.5 MG HALF TABLET
12.5000 mg | ORAL_TABLET | Freq: Two times a day (BID) | ORAL | Status: DC
  Administered 2021-11-06 – 2021-11-08 (×5): 12.5 mg via ORAL
  Filled 2021-11-06 (×5): qty 1

## 2021-11-06 MED ORDER — SENNOSIDES-DOCUSATE SODIUM 8.6-50 MG PO TABS
1.0000 | ORAL_TABLET | Freq: Every evening | ORAL | Status: DC | PRN
  Filled 2021-11-06: qty 1

## 2021-11-06 MED ORDER — TRAZODONE HCL 50 MG PO TABS
50.0000 mg | ORAL_TABLET | Freq: Every evening | ORAL | Status: DC | PRN
  Administered 2021-11-06 – 2021-11-07 (×2): 50 mg via ORAL
  Filled 2021-11-06 (×2): qty 1

## 2021-11-06 MED ORDER — HYDRALAZINE HCL 20 MG/ML IJ SOLN: 10.0000 mg | INTRAMUSCULAR | Status: DC | PRN

## 2021-11-06 MED ORDER — METOPROLOL TARTRATE 5 MG/5ML IV SOLN: 5.0000 mg | INTRAVENOUS | Status: DC | PRN

## 2021-11-06 MED ORDER — IPRATROPIUM-ALBUTEROL 0.5-2.5 (3) MG/3ML IN SOLN
3.0000 mL | RESPIRATORY_TRACT | Status: DC | PRN
Start: 2021-11-06 — End: 2021-11-08

## 2021-11-06 NOTE — Progress Notes (Signed)
*  PRELIMINARY RESULTS* ?Echocardiogram ?2D Echocardiogram has been performed. ? ?Zachary Nolan ?11/06/2021, 1:25 PM ?

## 2021-11-06 NOTE — Care Management Obs Status (Signed)
MEDICARE OBSERVATION STATUS NOTIFICATION ? ? ?Patient Details  ?Name: Zachary Nolan ?MRN: [id number] ?Date of Birth: 06/22/1941 ? ? ?Medicare Observation Status Notification Given:  Yes ? ? ? ?Christop Hippert G., RN ?11/06/2021, 10:18 AM ?

## 2021-11-06 NOTE — Progress Notes (Signed)
? ?Progress Note ? ?Patient Name: Zachary Nolan ?Date of Encounter: 11/06/2021 ? ?[facility] Cardiologist: Marlou Porch ? ?Subjective  ? ?No complaints ?Inpatient Medications  ?  ?Scheduled Meds: ? ALPRAZolam  0.5 mg Oral QHS  ? aspirin EC  81 mg Oral Daily  ? enoxaparin (LOVENOX) injection  40 mg Subcutaneous Q24H  ? gabapentin  300 mg Oral QHS  ? insulin aspart  0-9 Units Subcutaneous Q4H  ? pantoprazole  40 mg Oral Daily  ? ?Continuous Infusions: ? ?PRN Meds: ?acetaminophen, hydrALAZINE, ipratropium-albuterol, metoprolol tartrate, ondansetron (ZOFRAN) IV, senna-docusate, traZODone  ? ?Vital Signs  ?  ?Vitals:  ? 11/06/21 0203 11/06/21 3295 11/06/21 0204 11/06/21 0309  ?BP: 107/82   118/75  ?Pulse: 79 79  77  ?Resp: 12 12  (!) 6  ?Temp:   98 ?F (36.7 ?C) 97.6 ?F (36.4 ?C)  ?TempSrc:   Oral Oral  ?SpO2: 98% 98%    ?Weight:    89.3 kg  ?Height:    '5\' 8"'$  (1.727 m)  ? ?No intake or output data in the 24 hours ending 11/06/21 0922 ? ?  11/06/2021  ?  3:09 AM 11/05/2021  ?  5:41 PM 08/31/2021  ?  2:20 PM  ?Last 3 Weights  ?Weight (lbs) 196 lb 13.9 oz 196 lb 202 lb 12.8 oz  ?Weight (kg) 89.3 kg 88.905 kg 91.989 kg  ?   ? ?Telemetry  ?  ?SR, short runs NSVT up to 7 beats - Personally Reviewed ? ?ECG  ?  ?N/a - Personally Reviewed ? ?Physical Exam  ? ?GEN: No acute distress.   ?Neck: No JVD ?Cardiac: RRR, no murmurs, rubs, or gallops.  ?Respiratory: Clear to auscultation bilaterally. ?GI: Soft, nontender, non-distended  ?MS: No edema; No deformity. ?Neuro:  Nonfocal  ?Psych: Normal affect  ? ?Labs  ?  ?High Sensitivity Troponin:   ?Recent Labs  ?Lab 11/05/21 ?1821 11/05/21 ?2040  ?TROPONINIHS 65* 69*  ?   ?Chemistry ?Recent Labs  ?Lab 11/05/21 ?1821  ?NA 136  ?K 4.0  ?CL 107  ?CO2 21*  ?GLUCOSE 215*  ?BUN 41*  ?CREATININE 1.70*  ?CALCIUM 9.3  ?MG 1.9  ?PROT 6.7  ?ALBUMIN 3.4*  ?AST 16  ?ALT 13  ?ALKPHOS 64  ?BILITOT 0.8  ?GFRNONAA 40*  ?ANIONGAP 8  ?  ?Lipids No results for input(s): CHOL, TRIG, HDL, LABVLDL, LDLCALC, CHOLHDL in  the last 168 hours.  ?Hematology ?Recent Labs  ?Lab 11/05/21 ?1821  ?WBC 5.2  ?RBC 3.34*  ?HGB 10.6*  ?HCT 30.9*  ?MCV 92.5  ?MCH 31.7  ?MCHC 34.3  ?RDW 13.6  ?PLT 179  ? ?Thyroid No results for input(s): TSH, FREET4 in the last 168 hours.  ?BNP ?Recent Labs  ?Lab 11/05/21 ?1821  ?BNP 139.3*  ?  ?DDimer  ?Recent Labs  ?Lab 11/05/21 ?2300  ?DDIMER 1.08*  ?  ? ?Radiology  ?  ?DG Chest 2 View ? ?Result Date: 11/05/2021 ?CLINICAL DATA:  Generalized weakness and shortness of breath. EXAM: CHEST - 2 VIEW COMPARISON:  August 23, 2012 FINDINGS: The heart size and mediastinal contours are within normal limits. Mildly decreased lung volumes are seen. Both lungs are clear. A stable, benign-appearing sclerotic focus is seen within the proximal right humeral shaft. Degenerative changes are seen involving the left shoulder. IMPRESSION: No active cardiopulmonary disease. Electronically Signed   By: Virgina Norfolk M.D.   On: 11/05/2021 19:00   ? ?Cardiac Studies  ? ? ? ?Patient Profile  ?   ?Zachary Fries  Nolan is a 81 y.o. male with a hx of CAD RCA STEMI 2011 with residual LAD and Cx dz, ICM EF 35-40% recovered, DM, HTN, OSA who is being seen 11/05/2021 for the evaluation of elevated trop at the request of PA Abigal. ? ?Assessment & Plan  ?  ?1.Dyspnea ?- acute onset SOB, gradual onset lasted about 1 hour ?- Ddimer 1.08, trop 65-->65-->69. BNP 139. CXR no acute process ?EKG SR no acute ischemic changes ?- echo pending ?- would consider VQ scan given elevate ddimer ? ? ?2. PVCs/NSVT ?- likely related to prior scar ?- keep K at 4, Mg at 2 ?- notes mention not on beta blocker in the past due to bradycardia. Retry low dose while inpatient lopressor 12.'5mg'$  bid ? ?3. History of CAD ?- hx of CAD RCA STEMI 2011 with residual LAD and Cx dz, ICM EF 35-40% recovered ? ?For questions or updates, please contact Remy ?Please consult [email or web address] for contact info under  ? ?  ?   ?Signed, ?Carlyle Dolly, MD  ?11/06/2021, 9:22 AM   ? ?

## 2021-11-06 NOTE — Progress Notes (Signed)
?PROGRESS NOTE ? ? ? ?Zachary Nolan  TGY:[id number] DOB: [birth year] DOA: 11/05/2021 ?PCP: Deon Pilling, NP  ? ?Brief Narrative:  ?81 y.o. male with medical history significant of DM2, HTN, HLD, OSA, CAD s/p inferior STEMI in 2011 with drug-eluting stent to the RCA.  Had ischemic cardiomyopathy with EF 35 to 40% at that time which improved by nuclear study in 2014.  He also has diabetes hypertension hyperlipidemia.  Has had statin intolerance in the past and has not been on a beta-blocker because of bradycardia. Cholesterol's have not looked good recently with HDL 39 and LDL 192 in Feb 2022.  PCP started him on zetia, Dr. Marlou Porch tried to restart statin but pt says he wasn't able to tolerate statin. ?Patient admitted for shortness of breath ? ?Assessment & Plan: ? Principal Problem: ?  Chest pain, rule out acute myocardial infarction ?Active Problems: ?  HYPERCHOLESTEROLEMIA ?  CKD (chronic kidney disease) stage 3, GFR 30-59 ml/min (HCC) ?  Essential hypertension, benign ?  DM2 (diabetes mellitus, type 2) ([place]) ?  ? ? ?Assessment and Plan: ?* Chest pain, rule out acute myocardial infarction ?-Troponins are flat, EKG is overall nonischemic, NSR.  Seen by cardiology, holding off on heparin drip ?- Echocardiogram ? ?Elevated D-dimer ?- VQ scan ordered. ? ?CKD (chronic kidney disease) stage 3, GFR 30-59 ml/min (HCC) ?-Creatinine currently at baseline of 1.7 ? ?HYPERCHOLESTEROLEMIA ?-Unable to tolerate statin, currently on Zetia ?- Lipid panel ? ?DM2 (diabetes mellitus, type 2) ([place]) ?-Home metformin and Jardiance on hold.  Sliding scale and Accu-Cheks. ? ?Essential hypertension, benign ?Recently taken off of BP meds due to hypotension ? ? ? ?DVT prophylaxis: Subcu Lovenox ?Code Status: Full code ?Family Communication:   ? ?Maintain hospital stay until cleared by cardiology team.  Echocardiogram and VQ scan pending. ? ? ? ? ?Subjective: ?Feels better this morning, may have some mild exertional dyspnea but denies any chest pain  and shortness of breath at rest. ? ? ? ?Examination: ? ?General exam: Appears calm and comfortable  ?Respiratory system: Clear to auscultation. Respiratory effort normal. ?Cardiovascular system: S1 & S2 heard, RRR. No JVD, murmurs, rubs, gallops or clicks. No pedal edema. ?Gastrointestinal system: Abdomen is nondistended, soft and nontender. No organomegaly or masses felt. Normal bowel sounds heard. ?Central nervous system: Alert and oriented. No focal neurological deficits. ?Extremities: Symmetric 5 x 5 power. ?Skin: No rashes, lesions or ulcers ?Psychiatry: Judgement and insight appear normal. Mood & affect appropriate.  ? ? ? ?Objective: ?Vitals:  ? 11/06/21 0203 11/06/21 2876 11/06/21 0204 11/06/21 0309  ?BP: 107/82   118/75  ?Pulse: 79 79  77  ?Resp: 12 12  (!) 6  ?Temp:   98 ?F (36.7 ?C) 97.6 ?F (36.4 ?C)  ?TempSrc:   Oral Oral  ?SpO2: 98% 98%    ?Weight:    89.3 kg  ?Height:    '5\' 8"'$  (1.727 m)  ? ?No intake or output data in the 24 hours ending 11/06/21 0723 ?Filed Weights  ? 11/05/21 1741 11/06/21 0309  ?Weight: 88.9 kg 89.3 kg  ? ? ? ?Data Reviewed:  ? ?CBC: ?Recent Labs  ?Lab 11/05/21 ?1821  ?WBC 5.2  ?NEUTROABS 3.4  ?HGB 10.6*  ?HCT 30.9*  ?MCV 92.5  ?PLT 179  ? ?Basic Metabolic Panel: ?Recent Labs  ?Lab 11/05/21 ?1821  ?NA 136  ?K 4.0  ?CL 107  ?CO2 21*  ?GLUCOSE 215*  ?BUN 41*  ?CREATININE 1.70*  ?CALCIUM 9.3  ?MG 1.9  ? ?GFR: ?Estimated  Creatinine Clearance: 36.4 mL/min (A) (by C-G formula based on SCr of 1.7 mg/dL (H)). ?Liver Function Tests: ?Recent Labs  ?Lab 11/05/21 ?1821  ?AST 16  ?ALT 13  ?ALKPHOS 64  ?BILITOT 0.8  ?PROT 6.7  ?ALBUMIN 3.4*  ? ?Recent Labs  ?Lab 11/05/21 ?1821  ?LIPASE 41  ? ?No results for input(s): AMMONIA in the last 168 hours. ?Coagulation Profile: ?No results for input(s): INR, PROTIME in the last 168 hours. ?Cardiac Enzymes: ?No results for input(s): CKTOTAL, CKMB, CKMBINDEX, TROPONINI in the last 168 hours. ?BNP (last 3 results) ?No results for input(s): PROBNP in the last  8760 hours. ?HbA1C: ?No results for input(s): HGBA1C in the last 72 hours. ?CBG: ?Recent Labs  ?Lab 11/06/21 ?0000 11/06/21 ?2094  ?GLUCAP 211* 171*  ? ?Lipid Profile: ?No results for input(s): CHOL, HDL, LDLCALC, TRIG, CHOLHDL, LDLDIRECT in the last 72 hours. ?Thyroid Function Tests: ?No results for input(s): TSH, T4TOTAL, FREET4, T3FREE, THYROIDAB in the last 72 hours. ?Anemia Panel: ?No results for input(s): VITAMINB12, FOLATE, FERRITIN, TIBC, IRON, RETICCTPCT in the last 72 hours. ?Sepsis Labs: ?No results for input(s): PROCALCITON, LATICACIDVEN in the last 168 hours. ? ?No results found for this or any previous visit (from the past 240 hour(s)).  ? ? ? ? ? ?Radiology Studies: ?DG Chest 2 View ? ?Result Date: 11/05/2021 ?CLINICAL DATA:  Generalized weakness and shortness of breath. EXAM: CHEST - 2 VIEW COMPARISON:  August 23, 2012 FINDINGS: The heart size and mediastinal contours are within normal limits. Mildly decreased lung volumes are seen. Both lungs are clear. A stable, benign-appearing sclerotic focus is seen within the proximal right humeral shaft. Degenerative changes are seen involving the left shoulder. IMPRESSION: No active cardiopulmonary disease. Electronically Signed   By: Virgina Norfolk M.D.   On: 11/05/2021 19:00   ? ? ? ? ? ?Scheduled Meds: ? ALPRAZolam  0.5 mg Oral QHS  ? aspirin EC  81 mg Oral Daily  ? enoxaparin (LOVENOX) injection  40 mg Subcutaneous Q24H  ? gabapentin  300 mg Oral QHS  ? insulin aspart  0-9 Units Subcutaneous Q4H  ? pantoprazole  40 mg Oral Daily  ? ?Continuous Infusions: ? ? LOS: 0 days  ? ?Time spent= 35 mins ? ? ? ?Yosgart Pavey Arsenio Loader, MD ?[facility] ? ?If 7PM-7AM, please contact night-coverage ? ?11/06/2021, 7:23 AM  ? ?

## 2021-11-07 DIAGNOSIS — I350 Nonrheumatic aortic (valve) stenosis: Secondary | ICD-10-CM | POA: Diagnosis not present

## 2021-11-07 DIAGNOSIS — R0600 Dyspnea, unspecified: Secondary | ICD-10-CM | POA: Diagnosis not present

## 2021-11-07 DIAGNOSIS — Z91013 Allergy to seafood: Secondary | ICD-10-CM | POA: Diagnosis not present

## 2021-11-07 DIAGNOSIS — Z7984 Long term (current) use of oral hypoglycemic drugs: Secondary | ICD-10-CM | POA: Diagnosis not present

## 2021-11-07 DIAGNOSIS — I255 Ischemic cardiomyopathy: Secondary | ICD-10-CM | POA: Diagnosis not present

## 2021-11-07 DIAGNOSIS — G4733 Obstructive sleep apnea (adult) (pediatric): Secondary | ICD-10-CM | POA: Diagnosis not present

## 2021-11-07 DIAGNOSIS — E7800 Pure hypercholesterolemia, unspecified: Secondary | ICD-10-CM | POA: Diagnosis not present

## 2021-11-07 DIAGNOSIS — I10 Essential (primary) hypertension: Secondary | ICD-10-CM | POA: Diagnosis not present

## 2021-11-07 DIAGNOSIS — F32A Depression, unspecified: Secondary | ICD-10-CM | POA: Diagnosis present

## 2021-11-07 DIAGNOSIS — R69 Illness, unspecified: Secondary | ICD-10-CM | POA: Diagnosis not present

## 2021-11-07 DIAGNOSIS — R0602 Shortness of breath: Secondary | ICD-10-CM | POA: Diagnosis not present

## 2021-11-07 DIAGNOSIS — R778 Other specified abnormalities of plasma proteins: Secondary | ICD-10-CM | POA: Diagnosis not present

## 2021-11-07 DIAGNOSIS — Z888 Allergy status to other drugs, medicaments and biological substances status: Secondary | ICD-10-CM | POA: Diagnosis not present

## 2021-11-07 DIAGNOSIS — N179 Acute kidney failure, unspecified: Secondary | ICD-10-CM | POA: Diagnosis present

## 2021-11-07 DIAGNOSIS — J302 Other seasonal allergic rhinitis: Secondary | ICD-10-CM | POA: Diagnosis not present

## 2021-11-07 DIAGNOSIS — N183 Chronic kidney disease, stage 3 unspecified: Secondary | ICD-10-CM | POA: Diagnosis not present

## 2021-11-07 DIAGNOSIS — Z7982 Long term (current) use of aspirin: Secondary | ICD-10-CM | POA: Diagnosis not present

## 2021-11-07 DIAGNOSIS — I472 Ventricular tachycardia, unspecified: Secondary | ICD-10-CM | POA: Diagnosis not present

## 2021-11-07 DIAGNOSIS — Z955 Presence of coronary angioplasty implant and graft: Secondary | ICD-10-CM | POA: Diagnosis not present

## 2021-11-07 DIAGNOSIS — I129 Hypertensive chronic kidney disease with stage 1 through stage 4 chronic kidney disease, or unspecified chronic kidney disease: Secondary | ICD-10-CM | POA: Diagnosis not present

## 2021-11-07 DIAGNOSIS — R079 Chest pain, unspecified: Secondary | ICD-10-CM | POA: Diagnosis not present

## 2021-11-07 DIAGNOSIS — I252 Old myocardial infarction: Secondary | ICD-10-CM | POA: Diagnosis not present

## 2021-11-07 DIAGNOSIS — I2510 Atherosclerotic heart disease of native coronary artery without angina pectoris: Secondary | ICD-10-CM | POA: Diagnosis not present

## 2021-11-07 DIAGNOSIS — Z79899 Other long term (current) drug therapy: Secondary | ICD-10-CM | POA: Diagnosis not present

## 2021-11-07 DIAGNOSIS — I493 Ventricular premature depolarization: Secondary | ICD-10-CM | POA: Diagnosis not present

## 2021-11-07 DIAGNOSIS — E1165 Type 2 diabetes mellitus with hyperglycemia: Secondary | ICD-10-CM | POA: Diagnosis not present

## 2021-11-07 DIAGNOSIS — E785 Hyperlipidemia, unspecified: Secondary | ICD-10-CM | POA: Diagnosis not present

## 2021-11-07 DIAGNOSIS — M1990 Unspecified osteoarthritis, unspecified site: Secondary | ICD-10-CM | POA: Diagnosis not present

## 2021-11-07 LAB — BASIC METABOLIC PANEL
Anion gap: 8 (ref 5–15)
BUN: 37 mg/dL — ABNORMAL HIGH (ref 8–23)
CO2: 22 mmol/L (ref 22–32)
Calcium: 9.2 mg/dL (ref 8.9–10.3)
Chloride: 106 mmol/L (ref 98–111)
Creatinine, Ser: 1.8 mg/dL — ABNORMAL HIGH (ref 0.61–1.24)
GFR, Estimated: 37 mL/min — ABNORMAL LOW (ref >60)
Glucose, Bld: 148 mg/dL — ABNORMAL HIGH (ref 70–99)
Potassium: 4.1 mmol/L (ref 3.5–5.1)
Sodium: 136 mmol/L (ref 135–145)

## 2021-11-07 LAB — GLUCOSE, CAPILLARY
Glucose-Capillary: 137 mg/dL — ABNORMAL HIGH (ref 70–99)
Glucose-Capillary: 135 mg/dL — ABNORMAL HIGH (ref 70–99)
Glucose-Capillary: 190 mg/dL — ABNORMAL HIGH (ref 70–99)
Glucose-Capillary: 164 mg/dL — ABNORMAL HIGH (ref 70–99)
Glucose-Capillary: 225 mg/dL — ABNORMAL HIGH (ref 70–99)
Glucose-Capillary: 196 mg/dL — ABNORMAL HIGH (ref 70–99)

## 2021-11-07 LAB — LIPID PANEL
Cholesterol: 249 mg/dL — ABNORMAL HIGH (ref 0–200)
HDL: 31 mg/dL — ABNORMAL LOW (ref >40)
LDL Cholesterol: 190 mg/dL — ABNORMAL HIGH (ref 0–99)
Total CHOL/HDL Ratio: 8 RATIO
Triglycerides: 138 mg/dL (ref <150)
VLDL: 28 mg/dL (ref 0–40)

## 2021-11-07 LAB — MAGNESIUM: Magnesium: 2.3 mg/dL (ref 1.7–2.4)

## 2021-11-07 MED ORDER — INSULIN ASPART 100 UNIT/ML IJ SOLN: 0.0000 [IU] | Freq: Every day | INTRAMUSCULAR | Status: DC

## 2021-11-07 MED ORDER — INSULIN ASPART 100 UNIT/ML IJ SOLN
0.0000 [IU] | Freq: Three times a day (TID) | INTRAMUSCULAR | Status: DC
  Administered 2021-11-08 (×2): 1 [IU] via SUBCUTANEOUS

## 2021-11-07 MED ORDER — SODIUM CHLORIDE 0.9 % IV BOLUS
500.0000 mL | Freq: Once | INTRAVENOUS | Status: AC
  Administered 2021-11-07: 500 mL via INTRAVENOUS

## 2021-11-07 MED ORDER — SODIUM CHLORIDE 0.9 % IV SOLN: INTRAVENOUS | Status: AC

## 2021-11-07 MED ORDER — INSULIN GLARGINE-YFGN 100 UNIT/ML ~~LOC~~ SOLN
10.0000 [IU] | Freq: Every day | SUBCUTANEOUS | Status: DC
Start: 2021-11-07 — End: 2021-11-08
  Administered 2021-11-07 – 2021-11-08 (×2): 10 [IU] via SUBCUTANEOUS
  Filled 2021-11-07 (×2): qty 0.1

## 2021-11-07 NOTE — Progress Notes (Signed)
? ?Progress Note ? ?Patient Name: Zachary Nolan ?Date of Encounter: 11/07/2021 ? ?[facility] Cardiologist: Radford Pax ? ?Subjective  ? ?No complaints ? ?Inpatient Medications  ?  ?Scheduled Meds: ? ALPRAZolam  0.5 mg Oral QHS  ? aspirin EC  81 mg Oral Daily  ? enoxaparin (LOVENOX) injection  40 mg Subcutaneous Q24H  ? gabapentin  300 mg Oral QHS  ? insulin aspart  0-9 Units Subcutaneous Q4H  ? insulin glargine-yfgn  10 Units Subcutaneous Daily  ? metoprolol tartrate  12.5 mg Oral BID  ? pantoprazole  40 mg Oral Daily  ? ?Continuous Infusions: ? sodium chloride 75 mL/hr at 11/07/21 0738  ? ?PRN Meds: ?acetaminophen, hydrALAZINE, ipratropium-albuterol, metoprolol tartrate, ondansetron (ZOFRAN) IV, senna-docusate, traZODone  ? ?Vital Signs  ?  ?Vitals:  ? 11/06/21 1136 11/06/21 1535 11/06/21 2123 11/07/21 0826  ?BP: 109/65 122/77 139/79 94/64  ?Pulse: 78  75 79  ?Resp: '18 16 16 16  '$ ?Temp: 98.4 ?F (36.9 ?C) 98.1 ?F (36.7 ?C) 98.7 ?F (37.1 ?C) 97.8 ?F (36.6 ?C)  ?TempSrc: Oral Oral Oral Oral  ?SpO2: 93%  98% 96%  ?Weight:      ?Height:      ? ? ?Intake/Output Summary (Last 24 hours) at 11/07/2021 0858 ?Last data filed at 11/06/2021 1600 ?Gross per 24 hour  ?Intake 48.65 ml  ?Output --  ?Net 48.65 ml  ? ? ?  11/06/2021  ?  3:09 AM 11/05/2021  ?  5:41 PM 08/31/2021  ?  2:20 PM  ?Last 3 Weights  ?Weight (lbs) 196 lb 13.9 oz 196 lb 202 lb 12.8 oz  ?Weight (kg) 89.3 kg 88.905 kg 91.989 kg  ?   ? ?Telemetry  ?  ?SR - Personally Reviewed ? ?ECG  ?  ?N/a - Personally Reviewed ? ?Physical Exam  ? ?GEN: No acute distress.   ?Neck: No JVD ?Cardiac: RRR, no murmurs, rubs, or gallops.  ?Respiratory: Clear to auscultation bilaterally. ?GI: Soft, nontender, non-distended  ?MS: No edema; No deformity. ?Neuro:  Nonfocal  ?Psych: Normal affect  ? ?Labs  ?  ?High Sensitivity Troponin:   ?Recent Labs  ?Lab 11/05/21 ?1821 11/05/21 ?2040  ?TROPONINIHS 65* 69*  ?   ?Chemistry ?Recent Labs  ?Lab 11/05/21 ?1821 11/06/21 ?7035 11/07/21 ?0320  ?NA 136 137  136  ?K 4.0 3.6 4.1  ?CL 107 107 106  ?CO2 21* 25 22  ?GLUCOSE 215* 113* 148*  ?BUN 41* 39* 37*  ?CREATININE 1.70* 1.61* 1.80*  ?CALCIUM 9.3 9.3 9.2  ?MG 1.9  --  2.3  ?PROT 6.7  --   --   ?ALBUMIN 3.4*  --   --   ?AST 16  --   --   ?ALT 13  --   --   ?ALKPHOS 64  --   --   ?BILITOT 0.8  --   --   ?GFRNONAA 40* 42* 37*  ?ANIONGAP '8 5 8  '$ ?  ?Lipids  ?Recent Labs  ?Lab 11/07/21 ?0320  ?CHOL 249*  ?TRIG 138  ?HDL 31*  ?[place] 190*  ?CHOLHDL 8.0  ?  ?Hematology ?Recent Labs  ?Lab 11/05/21 ?1821  ?WBC 5.2  ?RBC 3.34*  ?HGB 10.6*  ?HCT 30.9*  ?MCV 92.5  ?MCH 31.7  ?MCHC 34.3  ?RDW 13.6  ?PLT 179  ? ?Thyroid No results for input(s): TSH, FREET4 in the last 168 hours.  ?BNP ?Recent Labs  ?Lab 11/05/21 ?1821  ?BNP 139.3*  ?  ?DDimer  ?Recent Labs  ?Lab 11/05/21 ?2300  ?  DDIMER 1.08*  ?  ? ?Radiology  ?  ?DG Chest 2 View ? ?Result Date: 11/05/2021 ?CLINICAL DATA:  Generalized weakness and shortness of breath. EXAM: CHEST - 2 VIEW COMPARISON:  August 23, 2012 FINDINGS: The heart size and mediastinal contours are within normal limits. Mildly decreased lung volumes are seen. Both lungs are clear. A stable, benign-appearing sclerotic focus is seen within the proximal right humeral shaft. Degenerative changes are seen involving the left shoulder. IMPRESSION: No active cardiopulmonary disease. Electronically Signed   By: Virgina Norfolk M.D.   On: 11/05/2021 19:00  ? ?ECHOCARDIOGRAM COMPLETE ? ?Result Date: 11/06/2021 ?   ECHOCARDIOGRAM REPORT   Patient Name:   Zachary Nolan Date of Exam: 11/06/2021 Medical Rec #:  [id number]  Height:       68.0 in Accession #:    [id number] Weight:       196.9 lb Date of Birth:  [birth year]  BSA:          2.030 m? Patient Age:    81 years   BP:           109/65 mmHg Patient Gender: M          HR:           80 bpm. Exam Location:  Inpatient Procedure: 2D Echo, Cardiac Doppler and Color Doppler Indications:    I42.9 Cardiomyopathy  History:        Patient has prior history of Echocardiogram examinations,  most                 recent 06/14/2016. Risk Factors:Dyslipidemia, Former Smoker,                 Diabetes and Hypertension. SA node dysfunction. OSA. Former                 tobacco                 use. Obese. CKD (chronic kidney disease) stage 3, GFR 30-59                 ml/min. Cancer ([place]) (From Hx).  Sonographer:    Alvino Chapel RCS Referring Phys: [id number] ANKIT CHIRAG AMIN IMPRESSIONS  1. Left ventricular ejection fraction, by estimation, is 60 to 65%. The left ventricle has normal function. The left ventricle has no regional wall motion abnormalities. There is mild left ventricular hypertrophy. Left ventricular diastolic parameters are consistent with Grade I diastolic dysfunction (impaired relaxation).  2. Right ventricular systolic function is normal. The right ventricular size is normal. Tricuspid regurgitation signal is inadequate for assessing PA pressure.  3. Left atrial size was moderately dilated.  4. The mitral valve is normal in structure. No evidence of mitral valve regurgitation. No evidence of mitral stenosis.  5. The aortic valve has an indeterminant number of cusps. There is mild calcification of the aortic valve. There is mild thickening of the aortic valve. Aortic valve regurgitation is mild. Mild to moderate aortic valve stenosis. Aortic valve mean gradient measures 17.0 mmHg. Aortic valve peak gradient measures 29.8 mmHg. Aortic valve area, by VTI measures 1.23 cm?.  6. The inferior vena cava is normal in size with greater than 50% respiratory variability, suggesting right atrial pressure of 3 mmHg. FINDINGS  Left Ventricle: Left ventricular ejection fraction, by estimation, is 60 to 65%. The left ventricle has normal function. The left ventricle has no regional wall motion abnormalities. The left ventricular internal cavity size was normal in size. There is  mild left ventricular hypertrophy. Left ventricular diastolic parameters are consistent with Grade I diastolic dysfunction (impaired  relaxation). Right Ventricle: The right ventricular size is normal. No increase in right ventricular wall thickness. Right ventricular systolic function is normal. Tricuspid regurgitation signal is inadequate for assessing PA pressure. Left Atrium: Left atrial size was moderately dilated. Right Atrium: Right atrial size was normal in size. Pericardium: There is no evidence of pericardial effusion. Mitral Valve: The mitral valve is normal in structure. No evidence of mitral valve regurgitation. No evidence of mitral valve stenosis. Tricuspid Valve: The tricuspid valve is normal in structure. Tricuspid valve regurgitation is not demonstrated. No evidence of tricuspid stenosis. Aortic Valve: The aortic valve has an indeterminant number of cusps. There is mild calcification of the aortic valve. There is mild thickening of the aortic valve. There is mild aortic valve annular calcification. Aortic valve regurgitation is mild. Aortic regurgitation PHT measures 529 msec. Mild to moderate aortic stenosis is present. Aortic valve mean gradient measures 17.0 mmHg. Aortic valve peak gradient measures 29.8 mmHg. Aortic valve area, by VTI measures 1.23 cm?. Pulmonic Valve: The pulmonic valve was not well visualized. Pulmonic valve regurgitation is trivial. No evidence of pulmonic stenosis. Aorta: The aortic root is normal in size and structure. Venous: The inferior vena cava is normal in size with greater than 50% respiratory variability, suggesting right atrial pressure of 3 mmHg. IAS/Shunts: No atrial level shunt detected by color flow Doppler.  LEFT VENTRICLE PLAX 2D LVIDd:         5.60 cm   Diastology LVIDs:         3.77 cm   LV e' medial:    4.35 cm/s LV PW:         1.03 cm   LV E/e' medial:  16.2 LV IVS:        1.12 cm   LV e' lateral:   6.85 cm/s LVOT diam:     2.20 cm   LV E/e' lateral: 10.3 LV SV:         75 LV SV Index:   37 LVOT Area:     3.80 cm?  RIGHT VENTRICLE RV S prime:     18.40 cm/s TAPSE (M-mode): 2.3 cm LEFT  ATRIUM             Index        RIGHT ATRIUM           Index LA diam:        4.10 cm 2.02 cm/m?   RA Area:     12.80 cm? LA Vol (A2C):   91.4 ml 45.02 ml/m?  RA Volume:   29.80 ml  14.68 ml/m? LA Vo

## 2021-11-07 NOTE — Progress Notes (Addendum)
?PROGRESS NOTE ? ? ? ?Zachary Nolan  UKG:[id number] DOB: December 15, 1940 DOA: 11/05/2021 ?PCP: Deon Pilling, NP  ? ?Brief Narrative:  ?81 y.o. male with medical history significant of DM2, HTN, HLD, OSA, CAD s/p inferior STEMI in 2011 with drug-eluting stent to the RCA.  Had ischemic cardiomyopathy with EF 35 to 40% at that time which improved by nuclear study in 2014.  He also has diabetes hypertension hyperlipidemia.  Has had statin intolerance in the past and has not been on a beta-blocker because of bradycardia. Cholesterol's have not looked good recently with HDL 39 and LDL 192 in Feb 2022.  PCP started him on zetia, Dr. Marlou Porch tried to restart statin but pt says he wasn't able to tolerate statin. ?Patient admitted for shortness of breath.  Patient has been seen by cardiology team.  Elevated D-dimer therefore VQ scan ordered. ? ?Assessment & Plan: ? Principal Problem: ?  Chest pain, rule out acute myocardial infarction ?Active Problems: ?  HYPERCHOLESTEROLEMIA ?  CKD (chronic kidney disease) stage 3, GFR 30-59 ml/min (HCC) ?  Essential hypertension, benign ?  DM2 (diabetes mellitus, type 2) ([place]) ?  ? ? ?Assessment and Plan: ?* Chest pain, rule out acute myocardial infarction ?-Troponins are flat, EKG is overall nonischemic, NSR.  Cardiology team following ?- Echocardiogram-EF 60%, grade 1 DD ? ?Elevated D-dimer ?- VQ scan-currently pending ? ?AKI on CKD (chronic kidney disease) stage 3, GFR 30-59 ml/min (HCC) ?- Creatinine baseline around 1.1 (2019), admission creatinine 1.7.  Today 1.8. ?- IV fluid ordered ? ?HYPERCHOLESTEROLEMIA ?-Unable to tolerate statin, currently on Zetia ?- Lipid panel-LDL 190. ? ?DM2 (diabetes mellitus, type 2) ([place]) ?-Home metformin and Jardiance on hold.  Sliding scale and Accu-Cheks. ?- A1c 11.2 ?- Add Semglee 10 units daily ?- Diabetic coordinator consulted ? ?Essential hypertension, benign ?Recently taken off of BP meds due to hypotension ? ? ? ?DVT prophylaxis: Subcu Lovenox ?Code Status:  Full code ?Family Communication:  Called his spouse; Arbie Cookey.  ? ?Maintain hospital stay until cleared by cardiology team.  VQ scan is pending, creatinine slightly trending upwards therefore requiring IV fluids and closer monitoring. ? ? ? ?Subjective: ?Seen and examined at bedside, does not any complaints this morning.  Denies any chest pain. ?Examination: ? ?Constitutional: Not in acute distress ?Respiratory: Clear to auscultation bilaterally ?Cardiovascular: Normal sinus rhythm, no rubs ?Abdomen: Nontender nondistended good bowel sounds ?Musculoskeletal: No edema noted ?Skin: No rashes seen ?Neurologic: CN 2-12 grossly intact.  And nonfocal ?Psychiatric: Normal judgment and insight. Alert and oriented x 3. Normal mood. ? ? ?Objective: ?Vitals:  ? 11/06/21 1025 11/06/21 1136 11/06/21 1535 11/06/21 2123  ?BP: 111/71 109/65 122/77 139/79  ?Pulse: 72 78  75  ?Resp: '18 18 16 16  '$ ?Temp: 98 ?F (36.7 ?C) 98.4 ?F (36.9 ?C) 98.1 ?F (36.7 ?C) 98.7 ?F (37.1 ?C)  ?TempSrc:  Oral Oral Oral  ?SpO2: 98% 93%  98%  ?Weight:      ?Height:      ? ? ?Intake/Output Summary (Last 24 hours) at 11/07/2021 0729 ?Last data filed at 11/06/2021 1600 ?Gross per 24 hour  ?Intake 48.65 ml  ?Output --  ?Net 48.65 ml  ? ?Filed Weights  ? 11/05/21 1741 11/06/21 0309  ?Weight: 88.9 kg 89.3 kg  ? ? ? ?Data Reviewed:  ? ?CBC: ?Recent Labs  ?Lab 11/05/21 ?1821  ?WBC 5.2  ?NEUTROABS 3.4  ?HGB 10.6*  ?HCT 30.9*  ?MCV 92.5  ?PLT 179  ? ?Basic Metabolic Panel: ?Recent Labs  ?Lab  11/05/21 ?1821 11/06/21 ?8841 11/07/21 ?0320  ?NA 136 137 136  ?K 4.0 3.6 4.1  ?CL 107 107 106  ?CO2 21* 25 22  ?GLUCOSE 215* 113* 148*  ?BUN 41* 39* 37*  ?CREATININE 1.70* 1.61* 1.80*  ?CALCIUM 9.3 9.3 9.2  ?MG 1.9  --  2.3  ? ?GFR: ?Estimated Creatinine Clearance: 34.4 mL/min (A) (by C-G formula based on SCr of 1.8 mg/dL (H)). ?Liver Function Tests: ?Recent Labs  ?Lab 11/05/21 ?1821  ?AST 16  ?ALT 13  ?ALKPHOS 64  ?BILITOT 0.8  ?PROT 6.7  ?ALBUMIN 3.4*  ? ?Recent Labs  ?Lab  11/05/21 ?1821  ?LIPASE 41  ? ?No results for input(s): AMMONIA in the last 168 hours. ?Coagulation Profile: ?No results for input(s): INR, PROTIME in the last 168 hours. ?Cardiac Enzymes: ?No results for input(s): CKTOTAL, CKMB, CKMBINDEX, TROPONINI in the last 168 hours. ?BNP (last 3 results) ?No results for input(s): PROBNP in the last 8760 hours. ?HbA1C: ?Recent Labs  ?  11/06/21 ?6606  ?HGBA1C 11.2*  ? ?CBG: ?Recent Labs  ?Lab 11/06/21 ?1134 11/06/21 ?1808 11/06/21 ?1944 11/07/21 ?3016 11/07/21 ?0109  ?GLUCAP 162* 291* 220* 137* 135*  ? ?Lipid Profile: ?No results for input(s): CHOL, HDL, LDLCALC, TRIG, CHOLHDL, LDLDIRECT in the last 72 hours. ?Thyroid Function Tests: ?No results for input(s): TSH, T4TOTAL, FREET4, T3FREE, THYROIDAB in the last 72 hours. ?Anemia Panel: ?No results for input(s): VITAMINB12, FOLATE, FERRITIN, TIBC, IRON, RETICCTPCT in the last 72 hours. ?Sepsis Labs: ?No results for input(s): PROCALCITON, LATICACIDVEN in the last 168 hours. ? ?No results found for this or any previous visit (from the past 240 hour(s)).  ? ? ? ? ? ?Radiology Studies: ?DG Chest 2 View ? ?Result Date: 11/05/2021 ?CLINICAL DATA:  Generalized weakness and shortness of breath. EXAM: CHEST - 2 VIEW COMPARISON:  August 23, 2012 FINDINGS: The heart size and mediastinal contours are within normal limits. Mildly decreased lung volumes are seen. Both lungs are clear. A stable, benign-appearing sclerotic focus is seen within the proximal right humeral shaft. Degenerative changes are seen involving the left shoulder. IMPRESSION: No active cardiopulmonary disease. Electronically Signed   By: Virgina Norfolk M.D.   On: 11/05/2021 19:00  ? ?ECHOCARDIOGRAM COMPLETE ? ?Result Date: 11/06/2021 ?   ECHOCARDIOGRAM REPORT   Patient Name:   Zachary Nolan Date of Exam: 11/06/2021 Medical Rec #:  [id number]  Height:       68.0 in Accession #:    [id number] Weight:       196.9 lb Date of Birth:  31-Jan-1941  BSA:          2.030 m? Patient Age:     81 years   BP:           109/65 mmHg Patient Gender: M          HR:           80 bpm. Exam Location:  Inpatient Procedure: 2D Echo, Cardiac Doppler and Color Doppler Indications:    I42.9 Cardiomyopathy  History:        Patient has prior history of Echocardiogram examinations, most                 recent 06/14/2016. Risk Factors:Dyslipidemia, Former Smoker,                 Diabetes and Hypertension. SA node dysfunction. OSA. Former                 tobacco  use. Obese. CKD (chronic kidney disease) stage 3, GFR 30-59                 ml/min. Cancer ([employer]) (From Hx).  Sonographer:    Alvino Chapel RCS Referring Phys: [id number] Kista Robb CHIRAG Mihran Lebarron IMPRESSIONS  1. Left ventricular ejection fraction, by estimation, is 60 to 65%. The left ventricle has normal function. The left ventricle has no regional wall motion abnormalities. There is mild left ventricular hypertrophy. Left ventricular diastolic parameters are consistent with Grade I diastolic dysfunction (impaired relaxation).  2. Right ventricular systolic function is normal. The right ventricular size is normal. Tricuspid regurgitation signal is inadequate for assessing PA pressure.  3. Left atrial size was moderately dilated.  4. The mitral valve is normal in structure. No evidence of mitral valve regurgitation. No evidence of mitral stenosis.  5. The aortic valve has an indeterminant number of cusps. There is mild calcification of the aortic valve. There is mild thickening of the aortic valve. Aortic valve regurgitation is mild. Mild to moderate aortic valve stenosis. Aortic valve mean gradient measures 17.0 mmHg. Aortic valve peak gradient measures 29.8 mmHg. Aortic valve area, by VTI measures 1.23 cm?.  6. The inferior vena cava is normal in size with greater than 50% respiratory variability, suggesting right atrial pressure of 3 mmHg. FINDINGS  Left Ventricle: Left ventricular ejection fraction, by estimation, is 60 to 65%. The left ventricle has  normal function. The left ventricle has no regional wall motion abnormalities. The left ventricular internal cavity size was normal in size. There is  mild left ventricular hypertrophy. Left ventricular diastoli

## 2021-11-08 ENCOUNTER — Inpatient Hospital Stay (HOSPITAL_COMMUNITY): Payer: Medicare HMO

## 2021-11-08 ENCOUNTER — Other Ambulatory Visit: Payer: Self-pay | Admitting: Student

## 2021-11-08 ENCOUNTER — Other Ambulatory Visit (HOSPITAL_COMMUNITY): Payer: Self-pay

## 2021-11-08 DIAGNOSIS — R0602 Shortness of breath: Secondary | ICD-10-CM

## 2021-11-08 DIAGNOSIS — R778 Other specified abnormalities of plasma proteins: Secondary | ICD-10-CM

## 2021-11-08 DIAGNOSIS — R0600 Dyspnea, unspecified: Secondary | ICD-10-CM

## 2021-11-08 DIAGNOSIS — E785 Hyperlipidemia, unspecified: Secondary | ICD-10-CM | POA: Diagnosis not present

## 2021-11-08 DIAGNOSIS — I10 Essential (primary) hypertension: Secondary | ICD-10-CM | POA: Diagnosis not present

## 2021-11-08 DIAGNOSIS — R079 Chest pain, unspecified: Secondary | ICD-10-CM | POA: Diagnosis not present

## 2021-11-08 LAB — BASIC METABOLIC PANEL
Anion gap: 5 (ref 5–15)
BUN: 34 mg/dL — ABNORMAL HIGH (ref 8–23)
CO2: 23 mmol/L (ref 22–32)
Calcium: 9.2 mg/dL (ref 8.9–10.3)
Chloride: 113 mmol/L — ABNORMAL HIGH (ref 98–111)
Creatinine, Ser: 1.61 mg/dL — ABNORMAL HIGH (ref 0.61–1.24)
GFR, Estimated: 42 mL/min — ABNORMAL LOW (ref >60)
Glucose, Bld: 135 mg/dL — ABNORMAL HIGH (ref 70–99)
Potassium: 5 mmol/L (ref 3.5–5.1)
Sodium: 141 mmol/L (ref 135–145)

## 2021-11-08 LAB — MAGNESIUM: Magnesium: 2 mg/dL (ref 1.7–2.4)

## 2021-11-08 LAB — GLUCOSE, CAPILLARY
Glucose-Capillary: 136 mg/dL — ABNORMAL HIGH (ref 70–99)
Glucose-Capillary: 128 mg/dL — ABNORMAL HIGH (ref 70–99)

## 2021-11-08 MED ORDER — LIVING WELL WITH DIABETES BOOK
Freq: Once | Status: AC
  Filled 2021-11-08: qty 1

## 2021-11-08 MED ORDER — TECHNETIUM TO 99M ALBUMIN AGGREGATED
4.4000 | Freq: Once | INTRAVENOUS | Status: AC | PRN
  Administered 2021-11-08: 4.4 via INTRAVENOUS

## 2021-11-08 MED ORDER — METOPROLOL TARTRATE 25 MG PO TABS
12.5000 mg | ORAL_TABLET | Freq: Two times a day (BID) | ORAL | 0 refills | Status: DC
  Filled 2021-11-08: qty 60, 60d supply, fill #0

## 2021-11-08 NOTE — Progress Notes (Signed)
Ordered cardiac PET CT MPI for further evaluation of acute dyspnea and elevated troponin per Dr. Gardiner Rhyme. Please see rounding note from today for additional information. ? ?Shared Decision Making/Informed Consent{ ?The risks [chest pain, shortness of breath, cardiac arrhythmias, dizziness, blood pressure fluctuations, myocardial infarction, stroke/transient ischemic attack, nausea, vomiting, allergic reaction, radiation exposure, metallic taste sensation and life-threatening complications (estimated to be 1 in 10,000)], benefits (risk stratification, diagnosing coronary artery disease, treatment guidance) and alternatives of a cardiac PET stress test were discussed in detail with Zachary Nolan and he agrees to proceed. ? ?Darreld Mclean, PA-C ?11/08/2021 12:35 PM ? ? ? ?

## 2021-11-08 NOTE — Discharge Summary (Signed)
Physician Discharge Summary  ?Zachary Nolan HFW:[id number] DOB: 23-Jun-1941 DOA: 11/05/2021 ? ?PCP: Deon Pilling, NP ? ?Admit date: 11/05/2021 ?Discharge date: 11/08/2021 ? ?Admitted From: Home ?Disposition:  Home ? ?Recommendations for Outpatient Follow-up:  ?Follow up with PCP in 1-2 weeks ?Please obtain BMP/CBC in one week your next doctors visit.  ?Continue Lopressor 12.5 mg twice daily ?Outpatient follow-up with cardiology to be arranged by their service.  They will also make a referral at lipid clinic.  They will also arrange for outpatient stress test ? ? ?Discharge Condition: Stable ?CODE STATUS: Full code ?Diet recommendation: Heart healthy ? ?Brief/Interim Summary: ?81 y.o. male with medical history significant of DM2, HTN, HLD, OSA, CAD s/p inferior STEMI in 2011 with drug-eluting stent to the RCA.  Had ischemic cardiomyopathy with EF 35 to 40% at that time which improved by nuclear study in 2014.  He also has diabetes hypertension hyperlipidemia.  Has had statin intolerance in the past and has not been on a beta-blocker because of bradycardia. Cholesterol's have not looked good recently with HDL 39 and LDL 192 in Feb 2022.  PCP started him on zetia, Dr. Marlou Porch tried to restart statin but pt says he wasn't able to tolerate statin. ?Patient admitted for shortness of breath.  Patient has been seen by cardiology team.  Elevated D-dimer therefore VQ scan was negative ?  ?Assessment & Plan: ? Principal Problem: ?  Chest pain, rule out acute myocardial infarction ?Active Problems: ?  HYPERCHOLESTEROLEMIA ?  CKD (chronic kidney disease) stage 3, GFR 30-59 ml/min (HCC) ?  Essential hypertension, benign ?  DM2 (diabetes mellitus, type 2) ([place]) ?  ?  ?  ?Assessment and Plan: ?* Chest pain, rule out acute myocardial infarction ?-Troponins are flat, EKG is overall nonischemic, NSR.  Cardiology team following.  Continue metoprolol twice daily.  Cardiology to arrange for outpatient stress test ?- Echocardiogram-EF 60%, grade 1  DD ?  ?Elevated D-dimer ?- VQ scan-negative ?  ?AKI on CKD (chronic kidney disease) stage 3, GFR 30-59 ml/min (HCC) ?- Creatinine in 2019 was 1.1.  Admission creatinine 1.7, peaked at 1.8 and now improving.  Recommend outpatient follow-up with PCP and possibly may need nephrology referral. ?  ?HYPERCHOLESTEROLEMIA ?-Unable to tolerate statin, currently on Zetia.  Cardiology to make referral to outpatient lipid clinic ?- Lipid panel-LDL 190. ?  ?DM2 (diabetes mellitus, type 2) ([place]) ?-Hemoglobin A1c 11.2.  Resume outpatient regimen.  Need to closely monitor this and follow-up with outpatient endocrinology if necessary, this can be arranged by PCP ?  ?Essential hypertension, benign ?Currently on metoprolol twice daily ?  ? ? ? ?Discharge Diagnoses:  ?Principal Problem: ?  Chest pain, rule out acute myocardial infarction ?Active Problems: ?  HYPERCHOLESTEROLEMIA ?  CKD (chronic kidney disease) stage 3, GFR 30-59 ml/min (HCC) ?  Essential hypertension, benign ?  DM2 (diabetes mellitus, type 2) ([employer]) ?  AKI (acute kidney injury) ([place]) ?  SOB (shortness of breath) ?  Elevated troponin ?  Hyperlipidemia ? ? ? ? ? ?Consultations: ?Cardiology ? ?Subjective: ?Feels great no complaints.  Remains chest pain-free. ? ?Discharge Exam: ?Vitals:  ? 11/07/21 2132 11/08/21 0130  ?BP: 131/67 140/76  ?Pulse: 85 73  ?Resp:  16  ?Temp:  98.4 ?F (36.9 ?C)  ?SpO2:  100%  ? ?Vitals:  ? 11/07/21 1157 11/07/21 1500 11/07/21 2132 11/08/21 0130  ?BP: 95/72  131/67 140/76  ?Pulse: 63 65 85 73  ?Resp: 15   16  ?Temp: 97.7 ?F (36.5 ?C)  98.4 ?F (36.9 ?C)  ?TempSrc: Oral   Oral  ?SpO2: 100% 97%  100%  ?Weight:      ?Height:      ? ? ?General: Pt is alert, awake, not in acute distress ?Cardiovascular: RRR, S1/S2 +, no rubs, no gallops ?Respiratory: CTA bilaterally, no wheezing, no rhonchi ?Abdominal: Soft, NT, ND, bowel sounds + ?Extremities: no edema, no cyanosis ? ?Discharge Instructions ? ?Discharge Instructions   ? ? AMB Referral to Advanced  Lipid Disorders Clinic   Complete by: As directed ?  ? Reason for referral: PCSK9 inhibitor education and prior authorization approvals  ? Referred to: PharmD  ? Internal Lipid Clinic Referral Scheduling ? ?Internal lipid clinic referrals are providers within [facility], who wish to refer established patients for routine management (help in starting PCSK9 inhibitor therapy) or advanced therapies. ? ?Internal MD referral criteria: ? ?            1. All patients with LDL>190 mg/dL ? 2. All patients with Triglycerides >500 mg/dL ? 3. Patients with suspected or confirmed heterozygous familial hyperlipidemia (HeFH) or homozygous familial hyperlipidemia (HoFH) ? 4. Patients with family history of suspicious for genetic dyslipidemia desiring genetic testing ? 5. Patients refractory to standard guideline based therapy ? 6. Patients with statin intolerance (failed 2 statins, one of which must be a high potency statin) ? 7. Patients who the provider desires to be seen by MD ? ? ?Internal PharmD referral criteria: ? ? 1. Follow-up patients for medication management ? 2. Follow-up for compliance monitoring ? 3. Patients for drug education ? 4. Patients with statin intolerance ? 5. PCSK9 inhibitor education and prior authorization approvals ? 6. Patients with triglycerides <500 mg/dL ? ?External Lipid Clinic Referral ? ?External lipid clinic referrals are for providers outside of [facility], considered new clinic patients - automatically routed to MD schedule  ? ?  ? ?Allergies as of 11/08/2021   ? ?   Reactions  ? Dust Mite Extract   ? Stuffy head  ? Pollen Extract   ? Stuffy head  ? Shellfish-derived Products Swelling  ? JUST CRAB MEAT  ? Statins Other (See Comments)  ? Dizziness and myalgias  ? ?  ? ?  ?Medication List  ?  ? ?STOP taking these medications   ? ?rosuvastatin 10 MG tablet ?Commonly known as: Crestor ?  ? ?  ? ?TAKE these medications   ? ?acetaminophen 650 MG CR tablet ?Commonly known as: TYLENOL ?Take 650-1,300 mg  by mouth every 8 (eight) hours as needed for pain. ?  ?ALPRAZolam 0.5 MG tablet ?Commonly known as: Duanne Moron ?Take 0.5 mg by mouth at bedtime. ?  ?aspirin EC 81 MG tablet ?Take 1 tablet (81 mg total) by mouth daily. Swallow whole. ?  ?ezetimibe 10 MG tablet ?Commonly known as: ZETIA ?Take 10 mg by mouth daily. ?  ?gabapentin 300 MG capsule ?Commonly known as: NEURONTIN ?Take 300 mg by mouth at bedtime. ?  ?Jardiance 10 MG Tabs tablet ?Generic drug: empagliflozin ?Take 10 mg by mouth daily. ?  ?metFORMIN 1000 MG tablet ?Commonly known as: GLUCOPHAGE ?Take 1,000 mg by mouth 2 (two) times daily. ?  ?metoprolol tartrate 25 MG tablet ?Commonly known as: LOPRESSOR ?Take 0.5 tablets (12.5 mg total) by mouth 2 (two) times daily. ?  ?nitroGLYCERIN 0.4 MG SL tablet ?Commonly known as: NITROSTAT ?Place 0.4 mg under the tongue every 5 (five) minutes as needed for chest pain. ?  ?omeprazole 20 MG capsule ?Commonly known as: PRILOSEC ?  Take 20 mg by mouth daily as needed (heartburn/acid reflux). ?  ?Ventolin HFA 108 (90 Base) MCG/ACT inhaler ?Generic drug: albuterol ?Inhale 2 puffs into the lungs every 4 (four) hours as needed for wheezing or shortness of breath. ?  ? ?  ? ? Follow-up Information   ? ? Deon Pilling, NP Follow up in 1 week(s).   ?Contact information: ?[address] ?[address] ?[place] [address] ?[phone] ? ? ?  ?  ? ? Jerline Pain, MD .   ?Specialty: Cardiology ?Contact information: ?[address] ?[address] ?[place] [address] ?[phone] ? ? ?  ?  ? ?  ?  ? ?  ? ?Allergies  ?Allergen Reactions  ? Dust Mite Extract   ?  Stuffy head  ? Pollen Extract   ?  Stuffy head  ? Shellfish-Derived Products Swelling  ?  JUST CRAB MEAT  ? Statins Other (See Comments)  ?  Dizziness and myalgias  ? ? ?You were cared for by a hospitalist during your hospital stay. If you have any questions about your discharge medications or the care you received while you were in the hospital after you are discharged, you  can call the unit and asked to speak with the hospitalist on call if the hospitalist that took care of you is not available. Once you are discharged, your primary care physician will handle any further medi

## 2021-11-08 NOTE — Progress Notes (Addendum)
? ?Progress Note ? ?Patient Name: Zachary Nolan ?Date of Encounter: 11/08/2021 ? ?[facility] Cardiologist: Dr. Marlou Porch  ? ?Subjective  ? ?No acute overnight events. Patient denies any recurrent episodes of severe shortness of breath like what brought him in. He really thinks this episode was due to allergies but states it did not improve with his inhaler like it usually does. He reports some chronic dyspnea on exertion as well but this is stable and likely due to deconditioning (he states he is not very active due to diffuse arthritis). No chest pain. ? ?Inpatient Medications  ?  ?Scheduled Meds: ? ALPRAZolam  0.5 mg Oral QHS  ? aspirin EC  81 mg Oral Daily  ? enoxaparin (LOVENOX) injection  40 mg Subcutaneous Q24H  ? gabapentin  300 mg Oral QHS  ? insulin aspart  0-5 Units Subcutaneous QHS  ? insulin aspart  0-9 Units Subcutaneous TID WC  ? insulin glargine-yfgn  10 Units Subcutaneous Daily  ? living well with diabetes book   Does not apply Once  ? metoprolol tartrate  12.5 mg Oral BID  ? pantoprazole  40 mg Oral Daily  ? ?Continuous Infusions: ? ?PRN Meds: ?acetaminophen, hydrALAZINE, ipratropium-albuterol, metoprolol tartrate, ondansetron (ZOFRAN) IV, senna-docusate, traZODone  ? ?Vital Signs  ?  ?Vitals:  ? 11/07/21 1157 11/07/21 1500 11/07/21 2132 11/08/21 0130  ?BP: 95/72  131/67 140/76  ?Pulse: 63 65 85 73  ?Resp: 15   16  ?Temp: 97.7 ?F (36.5 ?C)   98.4 ?F (36.9 ?C)  ?TempSrc: Oral   Oral  ?SpO2: 100% 97%  100%  ?Weight:      ?Height:      ? ? ?Intake/Output Summary (Last 24 hours) at 11/08/2021 1018 ?Last data filed at 11/07/2021 1629 ?Gross per 24 hour  ?Intake 1340 ml  ?Output --  ?Net 1340 ml  ? ? ?  11/06/2021  ?  3:09 AM 11/05/2021  ?  5:41 PM 08/31/2021  ?  2:20 PM  ?Last 3 Weights  ?Weight (lbs) 196 lb 13.9 oz 196 lb 202 lb 12.8 oz  ?Weight (kg) 89.3 kg 88.905 kg 91.989 kg  ?   ? ?Telemetry  ?  ?Normal sinus rhythm with PVC/PACs and some ventricular triplets but no longer runs of NSVT. Rates in the 30s to  64s. - Personally Reviewed ? ?ECG  ?  ?No new ECG tracing today. - Personally Reviewed ? ?Physical Exam  ? ?GEN: No acute distress.   ?Neck: No JVD ?Cardiac: Irregularly rhythm with normal rate. Soft I-II/VI systolic murmur. No gallops or rubs.  ?Respiratory: Clear to auscultation bilaterally. No wheezes, rhonchi, or rales. ?GI: Soft, non-distended, and non-tender.  ?MS: No lower extremity edema. No deformity. ?Skin: Warm and dry. ?Neuro:  No focal deficits. ?Psych: Normal affect. Responds appropriately. ? ?Labs  ?  ?High Sensitivity Troponin:   ?Recent Labs  ?Lab 11/05/21 ?1821 11/05/21 ?2040  ?TROPONINIHS 65* 69*  ?   ?Chemistry ?Recent Labs  ?Lab 11/05/21 ?1821 11/06/21 ?7673 11/07/21 ?0320 11/08/21 ?0202  ?NA 136 137 136 141  ?K 4.0 3.6 4.1 5.0  ?CL 107 107 106 113*  ?CO2 21* '25 22 23  '$ ?GLUCOSE 215* 113* 148* 135*  ?BUN 41* 39* 37* 34*  ?CREATININE 1.70* 1.61* 1.80* 1.61*  ?CALCIUM 9.3 9.3 9.2 9.2  ?MG 1.9  --  2.3 2.0  ?PROT 6.7  --   --   --   ?ALBUMIN 3.4*  --   --   --   ?AST  16  --   --   --   ?ALT 13  --   --   --   ?ALKPHOS 64  --   --   --   ?BILITOT 0.8  --   --   --   ?GFRNONAA 40* 42* 37* 42*  ?ANIONGAP '8 5 8 5  '$ ?  ?Lipids  ?Recent Labs  ?Lab 11/07/21 ?0320  ?CHOL 249*  ?TRIG 138  ?HDL 31*  ?[place] 190*  ?CHOLHDL 8.0  ?  ?Hematology ?Recent Labs  ?Lab 11/05/21 ?1821  ?WBC 5.2  ?RBC 3.34*  ?HGB 10.6*  ?HCT 30.9*  ?MCV 92.5  ?MCH 31.7  ?MCHC 34.3  ?RDW 13.6  ?PLT 179  ? ?Thyroid No results for input(s): TSH, FREET4 in the last 168 hours.  ?BNP ?Recent Labs  ?Lab 11/05/21 ?1821  ?BNP 139.3*  ?  ?DDimer  ?Recent Labs  ?Lab 11/05/21 ?2300  ?DDIMER 1.08*  ?  ? ?Radiology  ?  ?DG CHEST PORT 1 VIEW ? ?Result Date: 11/08/2021 ?CLINICAL DATA:  Provided history: Pre lung scan, sudden onset shortness of breath. EXAM: PORTABLE CHEST 1 VIEW COMPARISON:  Prior chest radiographs 11/05/2021 and earlier. FINDINGS: Heart size within normal limits. No appreciable airspace consolidation or pulmonary edema. No evidence of  pleural effusion or pneumothorax. No acute bony abnormality identified. Redemonstrated enchondroma within the proximal right humerus. IMPRESSION: No evidence of acute cardiopulmonary abnormality. Electronically Signed   By: Kellie Simmering D.O.   On: 11/08/2021 08:34  ? ?ECHOCARDIOGRAM COMPLETE ? ?Result Date: 11/06/2021 ?   ECHOCARDIOGRAM REPORT   Patient Name:   Zachary Nolan Date of Exam: 11/06/2021 Medical Rec #:  [id number]  Height:       68.0 in Accession #:    [id number] Weight:       196.9 lb Date of Birth:  1940/10/21  BSA:          2.030 m? Patient Age:    81 years   BP:           109/65 mmHg Patient Gender: M          HR:           80 bpm. Exam Location:  Inpatient Procedure: 2D Echo, Cardiac Doppler and Color Doppler Indications:    I42.9 Cardiomyopathy  History:        Patient has prior history of Echocardiogram examinations, most                 recent 06/14/2016. Risk Factors:Dyslipidemia, Former Smoker,                 Diabetes and Hypertension. SA node dysfunction. OSA. Former                 tobacco                 use. Obese. CKD (chronic kidney disease) stage 3, GFR 30-59                 ml/min. Cancer ([place]) (From Hx).  Sonographer:    Alvino Chapel RCS Referring Phys: [id number] ANKIT CHIRAG AMIN IMPRESSIONS  1. Left ventricular ejection fraction, by estimation, is 60 to 65%. The left ventricle has normal function. The left ventricle has no regional wall motion abnormalities. There is mild left ventricular hypertrophy. Left ventricular diastolic parameters are consistent with Grade I diastolic dysfunction (impaired relaxation).  2. Right ventricular systolic function is normal. The right ventricular size is normal. Tricuspid regurgitation signal  is inadequate for assessing PA pressure.  3. Left atrial size was moderately dilated.  4. The mitral valve is normal in structure. No evidence of mitral valve regurgitation. No evidence of mitral stenosis.  5. The aortic valve has an indeterminant number of cusps.  There is mild calcification of the aortic valve. There is mild thickening of the aortic valve. Aortic valve regurgitation is mild. Mild to moderate aortic valve stenosis. Aortic valve mean gradient measures 17.0 mmHg. Aortic valve peak gradient measures 29.8 mmHg. Aortic valve area, by VTI measures 1.23 cm?.  6. The inferior vena cava is normal in size with greater than 50% respiratory variability, suggesting right atrial pressure of 3 mmHg. FINDINGS  Left Ventricle: Left ventricular ejection fraction, by estimation, is 60 to 65%. The left ventricle has normal function. The left ventricle has no regional wall motion abnormalities. The left ventricular internal cavity size was normal in size. There is  mild left ventricular hypertrophy. Left ventricular diastolic parameters are consistent with Grade I diastolic dysfunction (impaired relaxation). Right Ventricle: The right ventricular size is normal. No increase in right ventricular wall thickness. Right ventricular systolic function is normal. Tricuspid regurgitation signal is inadequate for assessing PA pressure. Left Atrium: Left atrial size was moderately dilated. Right Atrium: Right atrial size was normal in size. Pericardium: There is no evidence of pericardial effusion. Mitral Valve: The mitral valve is normal in structure. No evidence of mitral valve regurgitation. No evidence of mitral valve stenosis. Tricuspid Valve: The tricuspid valve is normal in structure. Tricuspid valve regurgitation is not demonstrated. No evidence of tricuspid stenosis. Aortic Valve: The aortic valve has an indeterminant number of cusps. There is mild calcification of the aortic valve. There is mild thickening of the aortic valve. There is mild aortic valve annular calcification. Aortic valve regurgitation is mild. Aortic regurgitation PHT measures 529 msec. Mild to moderate aortic stenosis is present. Aortic valve mean gradient measures 17.0 mmHg. Aortic valve peak gradient measures  29.8 mmHg. Aortic valve area, by VTI measures 1.23 cm?. Pulmonic Valve: The pulmonic valve was not well visualized. Pulmonic valve regurgitation is trivial. No evidence of pulmonic stenosis. Aorta: The

## 2021-11-08 NOTE — Care Management (Signed)
1022 11-08-21 Case Manager received a consult for medication assistance. Patient has [employer]. Case Manager is unable to assist patients with insurance. No further needs identified at this time.  ?

## 2021-11-08 NOTE — Progress Notes (Signed)
Went over discharge paper work with patient and family. Questions answered. PIV removed, all belongings at bedside.  ?

## 2021-11-11 DIAGNOSIS — E1122 Type 2 diabetes mellitus with diabetic chronic kidney disease: Secondary | ICD-10-CM | POA: Diagnosis not present

## 2021-11-11 DIAGNOSIS — N1832 Chronic kidney disease, stage 3b: Secondary | ICD-10-CM | POA: Diagnosis not present

## 2021-11-11 DIAGNOSIS — E7801 Familial hypercholesterolemia: Secondary | ICD-10-CM | POA: Diagnosis not present

## 2021-11-16 DIAGNOSIS — M19012 Primary osteoarthritis, left shoulder: Secondary | ICD-10-CM | POA: Diagnosis not present

## 2021-11-18 DIAGNOSIS — J45909 Unspecified asthma, uncomplicated: Secondary | ICD-10-CM | POA: Diagnosis not present

## 2021-11-18 DIAGNOSIS — N179 Acute kidney failure, unspecified: Secondary | ICD-10-CM | POA: Diagnosis not present

## 2021-11-18 DIAGNOSIS — E1142 Type 2 diabetes mellitus with diabetic polyneuropathy: Secondary | ICD-10-CM | POA: Diagnosis not present

## 2021-11-18 DIAGNOSIS — Z09 Encounter for follow-up examination after completed treatment for conditions other than malignant neoplasm: Secondary | ICD-10-CM | POA: Diagnosis not present

## 2021-11-18 DIAGNOSIS — R600 Localized edema: Secondary | ICD-10-CM | POA: Diagnosis not present

## 2021-11-18 DIAGNOSIS — E7800 Pure hypercholesterolemia, unspecified: Secondary | ICD-10-CM | POA: Diagnosis not present

## 2021-11-19 DIAGNOSIS — N1832 Chronic kidney disease, stage 3b: Secondary | ICD-10-CM | POA: Diagnosis not present

## 2021-11-26 DIAGNOSIS — M109 Gout, unspecified: Secondary | ICD-10-CM | POA: Diagnosis not present

## 2021-12-03 DIAGNOSIS — M7989 Other specified soft tissue disorders: Secondary | ICD-10-CM | POA: Diagnosis not present

## 2021-12-03 DIAGNOSIS — G4730 Sleep apnea, unspecified: Secondary | ICD-10-CM | POA: Diagnosis not present

## 2021-12-03 DIAGNOSIS — M109 Gout, unspecified: Secondary | ICD-10-CM | POA: Diagnosis not present

## 2021-12-03 DIAGNOSIS — E1122 Type 2 diabetes mellitus with diabetic chronic kidney disease: Secondary | ICD-10-CM | POA: Diagnosis not present

## 2021-12-03 DIAGNOSIS — N1832 Chronic kidney disease, stage 3b: Secondary | ICD-10-CM | POA: Diagnosis not present

## 2021-12-03 DIAGNOSIS — R0602 Shortness of breath: Secondary | ICD-10-CM | POA: Diagnosis not present

## 2021-12-14 DIAGNOSIS — M109 Gout, unspecified: Secondary | ICD-10-CM | POA: Diagnosis not present

## 2021-12-14 DIAGNOSIS — G629 Polyneuropathy, unspecified: Secondary | ICD-10-CM | POA: Diagnosis not present

## 2021-12-14 DIAGNOSIS — M1990 Unspecified osteoarthritis, unspecified site: Secondary | ICD-10-CM | POA: Diagnosis not present

## 2021-12-14 DIAGNOSIS — I10 Essential (primary) hypertension: Secondary | ICD-10-CM | POA: Diagnosis not present

## 2021-12-14 DIAGNOSIS — E1122 Type 2 diabetes mellitus with diabetic chronic kidney disease: Secondary | ICD-10-CM | POA: Diagnosis not present

## 2021-12-22 ENCOUNTER — Encounter: Payer: Self-pay | Admitting: Pulmonary Disease

## 2021-12-22 ENCOUNTER — Ambulatory Visit: Payer: Medicare HMO | Admitting: Pulmonary Disease

## 2021-12-22 VITALS — BP 122/60 | HR 71 | Temp 97.6°F | Ht 67.0 in | Wt 197.2 lb

## 2021-12-22 DIAGNOSIS — R0602 Shortness of breath: Secondary | ICD-10-CM

## 2021-12-22 NOTE — Progress Notes (Signed)
Zachary Nolan    [id number]    Sep 10, 1940  Primary Care Physician:Tate, Joellen Jersey, NP  Referring Physician: Deon Pilling, NP [address] [place],  [place] [address]  Chief complaint:   Being evaluated for shortness of breath  HPI:  Has had episodes of shortness of breath both at rest and with activity At some point did wake up out of sleep and felt short of breath  Has a lot of allergies  No history of asthma  Past history of obstructive sleep apnea for which he used CPAP for about a year and then got out of using it because the mask was not well-tolerated  Reformed smoker quit many years ago in 1968 Was never labeled with any lung disease  He is not aware of any lung disease or heart disease Has a history of hypertension, hypercholesterolemia  He does have a lot of pain and discomfort  Some of his limitations is related to musculoskeletal limitations  He used to be able to walk up to 5 miles comfortably but recently has become more sedentary  Outpatient Encounter Medications as of 12/22/2021  Medication Sig   acetaminophen (TYLENOL) 650 MG CR tablet Take 650-1,300 mg by mouth every 8 (eight) hours as needed for pain.   ALPRAZolam (XANAX) 0.5 MG tablet Take 0.5 mg by mouth at bedtime.   gabapentin (NEURONTIN) 300 MG capsule Take 300 mg by mouth at bedtime.   JARDIANCE 10 MG TABS tablet Take 10 mg by mouth daily.   metFORMIN (GLUCOPHAGE) 1000 MG tablet Take 1,000 mg by mouth 2 (two) times daily.   metoprolol tartrate (LOPRESSOR) 25 MG tablet Take 1/2 tablet (12.5 mg total) by mouth 2 (two) times daily.   nitroGLYCERIN (NITROSTAT) 0.4 MG SL tablet Place 0.4 mg under the tongue every 5 (five) minutes as needed for chest pain.   omeprazole (PRILOSEC) 20 MG capsule Take 20 mg by mouth daily as needed (heartburn/acid reflux).   VENTOLIN HFA 108 (90 Base) MCG/ACT inhaler Inhale 2 puffs into the lungs every 4 (four) hours as needed for wheezing or shortness of  breath.   ezetimibe (ZETIA) 10 MG tablet Take 10 mg by mouth daily. (Patient not taking: Reported on 12/22/2021)   [DISCONTINUED] aspirin EC 81 MG tablet Take 1 tablet (81 mg total) by mouth daily. Swallow whole. (Patient not taking: Reported on 11/05/2021)   No facility-administered encounter medications on file as of 12/22/2021.    Allergies as of 12/22/2021 - Review Complete 12/22/2021  Allergen Reaction Noted   Dust mite extract     Pollen extract     Shellfish-derived products Swelling    Statins Other (See Comments) 10/13/2015    Past Medical History:  Diagnosis Date   CAD (coronary artery disease)    a. LHC (7/11):  Inf STEMI >>> inf AK, EF 35-40%, LAD 70-75%, mid CFX 70%, dist RCA 99% >>> PCI:  3.5 x 28 mm Promus DES to RCA;     Cancer ([place])    skin   Depression    Diabetes mellitus    Non-insulin-dependent diabetes mellitus.    DJD (degenerative joint disease)    HTN (hypertension)    Hx of cardiovascular stress test    a. Nuclear (3/14):  Small inf defect - likely scar; no ischemia, EF 59%; LOW RISK;  b. Lexiscan Myoview (1/16): No ischemia, fixed inferior defect consistent with prior infarct versus diaphragmatic attenuation, EF 57%, Low Risk   Hyperlipemia  Ischemic cardiomyopathy    EF 35-40% at time of MI in 2011 >> improved to normal on Nuclear study in 2014   OSA (obstructive sleep apnea) 09/10/2014   severe with AHI 68/hr   RLS (restless legs syndrome)    Seasonal allergies     Past Surgical History:  Procedure Laterality Date   COLONOSCOPY     NOSE SURGERY     Percutaneous coronary intervention using a drug-eluting stent (Promus)     Moderately    severe left anterior descending stenosis.  Moderate left     circumflex stenosis, moderate left ventricular dysfunction with   left  ventricular ejection fraction of 35% to 40%.    Release of left transcarpal ligament.     Release of right transcarpal ligament.     TONSILLECTOMY      Family History  Problem  Relation Age of Onset   Hypertension Mother    Asthma Mother    Heart attack Father    Hypertension Father    Diabetes Father    Coronary artery disease Unknown    Heart attack Brother    Hypertension Brother    Stroke Neg Hx     Social History   Socioeconomic History   Marital status: Married    Spouse name: Not on file   Number of children: Not on file   Years of education: Not on file   Highest education level: Not on file  Occupational History   Not on file  Tobacco Use   Smoking status: Former    Types: Cigarettes    Quit date: 1968    Years since quitting: 55.5   Smokeless tobacco: Never  Vaping Use   Vaping Use: Never used  Substance and Sexual Activity   Alcohol use: No   Drug use: No   Sexual activity: Not on file  Other Topics Concern   Not on file  Social History Narrative   Not on file   Social Determinants of Health   Financial Resource Strain: Not on file  Food Insecurity: Not on file  Transportation Needs: Not on file  Physical Activity: Not on file  Stress: Not on file  Social Connections: Not on file  Intimate Partner Violence: Not on file    Review of Systems  Constitutional:  Positive for fatigue.  Respiratory:  Positive for shortness of breath.     Vitals:   12/22/21 1151  BP: 122/60  Pulse: 71  Temp: 97.6 F (36.4 C)  SpO2: 99%     Physical Exam Constitutional:      Appearance: He is obese.  HENT:     Head: Normocephalic.     Mouth/Throat:     Mouth: Mucous membranes are moist.  Cardiovascular:     Rate and Rhythm: Normal rate and regular rhythm.     Heart sounds: No murmur heard.    No friction rub.  Pulmonary:     Effort: No respiratory distress.     Breath sounds: No stridor. No wheezing or rhonchi.  Musculoskeletal:     Cervical back: No rigidity or tenderness.  Neurological:     Mental Status: He is alert.  Psychiatric:        Mood and Affect: Mood normal.      Data Reviewed: Most recent echocardiogram  in May 2023 shows normal ejection fraction, grade 1 diastolic dysfunction  PFT from 2017 reveals no obstruction, no restriction, normal diffusing capacity  Assessment:  Shortness of breath on exertion  Obstructive  sleep apnea -Not using CPAP currently -Did tolerate CPAP for about a year, did not like it but was able to use it -We did talk about the need to treat his sleep disordered breathing  Diastolic dysfunction on echocardiogram  Musculoskeletal pain and discomfort contributing to limitations  He does have albuterol to use as needed Inhaler technique is quite good reviewed today  Plan/Recommendations: Graded exercise as tolerated  Get back to using CPAP on a nightly basis  Obtain pulmonary function test to compare with previous  Encouraged to call with any significant concerns  Follow-up in 3 months  Sherrilyn Rist MD [facility] 12/22/2021, 11:58 AM  CC: Deon Pilling, NP

## 2021-12-22 NOTE — Patient Instructions (Signed)
I will see you back in 3 months  Breathing study before next visit or on the day of the visit  Try to start using your CPAP again  Graded activities as tolerated  Call us with significant concerns

## 2022-01-26 DIAGNOSIS — G4733 Obstructive sleep apnea (adult) (pediatric): Secondary | ICD-10-CM | POA: Diagnosis not present

## 2022-01-26 DIAGNOSIS — E1142 Type 2 diabetes mellitus with diabetic polyneuropathy: Secondary | ICD-10-CM | POA: Diagnosis not present

## 2022-01-26 DIAGNOSIS — E785 Hyperlipidemia, unspecified: Secondary | ICD-10-CM | POA: Diagnosis not present

## 2022-01-26 DIAGNOSIS — E669 Obesity, unspecified: Secondary | ICD-10-CM | POA: Diagnosis not present

## 2022-01-26 DIAGNOSIS — M109 Gout, unspecified: Secondary | ICD-10-CM | POA: Diagnosis not present

## 2022-01-26 DIAGNOSIS — M1990 Unspecified osteoarthritis, unspecified site: Secondary | ICD-10-CM | POA: Diagnosis not present

## 2022-01-26 DIAGNOSIS — K219 Gastro-esophageal reflux disease without esophagitis: Secondary | ICD-10-CM | POA: Diagnosis not present

## 2022-01-26 DIAGNOSIS — J45909 Unspecified asthma, uncomplicated: Secondary | ICD-10-CM | POA: Diagnosis not present

## 2022-01-26 DIAGNOSIS — R69 Illness, unspecified: Secondary | ICD-10-CM | POA: Diagnosis not present

## 2022-01-26 DIAGNOSIS — I252 Old myocardial infarction: Secondary | ICD-10-CM | POA: Diagnosis not present

## 2022-01-26 DIAGNOSIS — Z008 Encounter for other general examination: Secondary | ICD-10-CM | POA: Diagnosis not present

## 2022-01-26 DIAGNOSIS — I2510 Atherosclerotic heart disease of native coronary artery without angina pectoris: Secondary | ICD-10-CM | POA: Diagnosis not present

## 2022-01-26 DIAGNOSIS — I10 Essential (primary) hypertension: Secondary | ICD-10-CM | POA: Diagnosis not present

## 2022-02-08 DIAGNOSIS — N1832 Chronic kidney disease, stage 3b: Secondary | ICD-10-CM | POA: Diagnosis not present

## 2022-02-08 DIAGNOSIS — E1122 Type 2 diabetes mellitus with diabetic chronic kidney disease: Secondary | ICD-10-CM | POA: Diagnosis not present

## 2022-02-08 DIAGNOSIS — M7989 Other specified soft tissue disorders: Secondary | ICD-10-CM | POA: Diagnosis not present

## 2022-02-08 DIAGNOSIS — E7800 Pure hypercholesterolemia, unspecified: Secondary | ICD-10-CM | POA: Diagnosis not present

## 2022-02-08 DIAGNOSIS — I10 Essential (primary) hypertension: Secondary | ICD-10-CM | POA: Diagnosis not present

## 2022-02-11 DIAGNOSIS — M130 Polyarthritis, unspecified: Secondary | ICD-10-CM | POA: Diagnosis not present

## 2022-02-11 DIAGNOSIS — E1122 Type 2 diabetes mellitus with diabetic chronic kidney disease: Secondary | ICD-10-CM | POA: Diagnosis not present

## 2022-02-11 DIAGNOSIS — R69 Illness, unspecified: Secondary | ICD-10-CM | POA: Diagnosis not present

## 2022-02-11 DIAGNOSIS — I10 Essential (primary) hypertension: Secondary | ICD-10-CM | POA: Diagnosis not present

## 2022-02-11 DIAGNOSIS — N1832 Chronic kidney disease, stage 3b: Secondary | ICD-10-CM | POA: Diagnosis not present

## 2022-02-11 DIAGNOSIS — E1142 Type 2 diabetes mellitus with diabetic polyneuropathy: Secondary | ICD-10-CM | POA: Diagnosis not present

## 2022-02-11 DIAGNOSIS — D692 Other nonthrombocytopenic purpura: Secondary | ICD-10-CM | POA: Diagnosis not present

## 2022-02-11 DIAGNOSIS — I2510 Atherosclerotic heart disease of native coronary artery without angina pectoris: Secondary | ICD-10-CM | POA: Diagnosis not present

## 2022-02-11 DIAGNOSIS — E7800 Pure hypercholesterolemia, unspecified: Secondary | ICD-10-CM | POA: Diagnosis not present

## 2022-02-11 DIAGNOSIS — G4730 Sleep apnea, unspecified: Secondary | ICD-10-CM | POA: Diagnosis not present

## 2022-02-14 NOTE — Progress Notes (Unsigned)
Office Visit    Patient Name: Zachary Nolan Date of Encounter: 02/15/2022  PCP:  Deon Pilling, NP   [place]  Cardiologist:  Candee Furbish, MD  Advanced Practice Provider:  No care team member to display Electrophysiologist:  None    HPI    Zachary Nolan is a 81 y.o. male with a past medical history of CAD status post STEMI in 2011 with DES to RCA, diabetes mellitus, hypertension, hyperlipidemia, ischemic cardiomyopathy presents today for follow-up visit after hospitalization.  He had ischemic cardiopathy with EF 35 to 40% in 2011 after STEMI.  This improved by nuclear study in 2014.  He has had a statin intolerance in the past and not been able to tolerate beta-blockers due to bradycardia.  He has a history of OSA and had difficulties with CPAP.  He was last seen in March 2023 and he had not had any chest pain or shortness of breath.  He was currently in the hospital for shortness of breath.  Confirmed that there have been no hospitalizations since May.  He is just presenting for follow-up now.  Admitted 11/05/2021 to 11/08/2021.  He had an elevated D-dimer therefore a VQ scan was performed which was negative.  He reportedly was having chest pain at the time.  Troponins were flat and EKG was overall nonischemic.  Echocardiogram was performed which showed normal LVEF at 60%, grade 1 DD.  There was no pericardial effusion mentioned in the report.  The patient tells me today that they were told that he does have fluid around his heart and his lungs.  I did not find any evidence of this based on the studies performed in the hospital.  He tells me he does have an appointment with the kidney doctor.  He also will need close monitoring of his A1c since it was 11.2.  A referral to the lipid clinic was done for LDL of 190 but the patient never went.  He states that his breathing does get a little more difficult in the afternoons but he usually takes a an allergy pill and his inhaled  medicine and it improves.  He is slightly fluid overloaded today with 1-2+ pitting edema in his lower extremity but he was told recently by his PCP to double his furosemide and he just took a double dose yesterday.  Reports no chest pain, pressure, or tightness. No edema, orthopnea, PND. Reports no palpitations.     Past Medical History    Past Medical History:  Diagnosis Date   CAD (coronary artery disease)    a. LHC (7/11):  Inf STEMI >>> inf AK, EF 35-40%, LAD 70-75%, mid CFX 70%, dist RCA 99% >>> PCI:  3.5 x 28 mm Promus DES to RCA;     Cancer ([place])    skin   Depression    Diabetes mellitus    Non-insulin-dependent diabetes mellitus.    DJD (degenerative joint disease)    HTN (hypertension)    Hx of cardiovascular stress test    a. Nuclear (3/14):  Small inf defect - likely scar; no ischemia, EF 59%; LOW RISK;  b. Lexiscan Myoview (1/16): No ischemia, fixed inferior defect consistent with prior infarct versus diaphragmatic attenuation, EF 57%, Low Risk   Hyperlipemia    Ischemic cardiomyopathy    EF 35-40% at time of MI in 2011 >> improved to normal on Nuclear study in 2014   OSA (obstructive sleep apnea) 09/10/2014   severe with AHI 68/hr  RLS (restless legs syndrome)    Seasonal allergies    Past Surgical History:  Procedure Laterality Date   COLONOSCOPY     NOSE SURGERY     Percutaneous coronary intervention using a drug-eluting stent (Promus)     Moderately    severe left anterior descending stenosis.  Moderate left     circumflex stenosis, moderate left ventricular dysfunction with   left  ventricular ejection fraction of 35% to 40%.    Release of left transcarpal ligament.     Release of right transcarpal ligament.     TONSILLECTOMY      Allergies  Allergies  Allergen Reactions   Dust Mite Extract     Stuffy head   Pollen Extract     Stuffy head   Shellfish-Derived Products Swelling    JUST CRAB MEAT   Statins Other (See Comments)    Dizziness and myalgias     EKGs/Labs/Other Studies Reviewed:   The following studies were reviewed today:  Echocardiogram 11/06/2021 IMPRESSIONS     1. Left ventricular ejection fraction, by estimation, is 60 to 65%. The  left ventricle has normal function. The left ventricle has no regional  wall motion abnormalities. There is mild left ventricular hypertrophy.  Left ventricular diastolic parameters  are consistent with Grade I diastolic dysfunction (impaired relaxation).   2. Right ventricular systolic function is normal. The right ventricular  size is normal. Tricuspid regurgitation signal is inadequate for assessing  PA pressure.   3. Left atrial size was moderately dilated.   4. The mitral valve is normal in structure. No evidence of mitral valve  regurgitation. No evidence of mitral stenosis.   5. The aortic valve has an indeterminant number of cusps. There is mild  calcification of the aortic valve. There is mild thickening of the aortic  valve. Aortic valve regurgitation is mild. Mild to moderate aortic valve  stenosis. Aortic valve mean  gradient measures 17.0 mmHg. Aortic valve peak gradient measures 29.8  mmHg. Aortic valve area, by VTI measures 1.23 cm.   6. The inferior vena cava is normal in size with greater than 50%  respiratory variability, suggesting right atrial pressure of 3 mmHg.   EKG:  EKG is not ordered today.    Recent Labs: 11/05/2021: ALT 13; B Natriuretic Peptide 139.3; Hemoglobin 10.6; Platelets 179 11/08/2021: BUN 34; Creatinine, Ser 1.61; Magnesium 2.0; Potassium 5.0; Sodium 141  Recent Lipid Panel    Component Value Date/Time   CHOL 249 (H) 11/07/2021 0320   TRIG 138 11/07/2021 0320   HDL 31 (L) 11/07/2021 0320   CHOLHDL 8.0 11/07/2021 0320   VLDL 28 11/07/2021 0320   LDLCALC 190 (H) 11/07/2021 0320    Home Medications   Current Meds  Medication Sig   acetaminophen (TYLENOL) 650 MG CR tablet Take 650-1,300 mg by mouth every 8 (eight) hours as needed for pain.    ALPRAZolam (XANAX) 0.5 MG tablet Take 0.5 mg by mouth at bedtime.   ezetimibe (ZETIA) 10 MG tablet Take 10 mg by mouth daily.   gabapentin (NEURONTIN) 300 MG capsule Take 300 mg by mouth at bedtime.   JARDIANCE 10 MG TABS tablet Take 10 mg by mouth daily.   metoprolol tartrate (LOPRESSOR) 25 MG tablet Take 1/2 tablet (12.5 mg total) by mouth 2 (two) times daily.   nitroGLYCERIN (NITROSTAT) 0.4 MG SL tablet Place 0.4 mg under the tongue every 5 (five) minutes as needed for chest pain.   omeprazole (PRILOSEC) 20 MG capsule Take  20 mg by mouth daily as needed (heartburn/acid reflux).   VENTOLIN HFA 108 (90 Base) MCG/ACT inhaler Inhale 2 puffs into the lungs every 4 (four) hours as needed for wheezing or shortness of breath.   [DISCONTINUED] metFORMIN (GLUCOPHAGE) 1000 MG tablet Take 1,000 mg by mouth 2 (two) times daily.     Review of Systems      All other systems reviewed and are otherwise negative except as noted above.  Physical Exam    VS:  BP 112/72   Pulse 75   Ht '5\' 7"'$  (1.702 m)   Wt 199 lb 9.6 oz (90.5 kg)   SpO2 96%   BMI 31.26 kg/m  , BMI Body mass index is 31.26 kg/m.  Wt Readings from Last 3 Encounters:  02/15/22 199 lb 9.6 oz (90.5 kg)  12/22/21 197 lb 4 oz (89.5 kg)  11/06/21 196 lb 13.9 oz (89.3 kg)     GEN: Well nourished, well developed, in no acute distress. HEENT: normal. Neck: Supple, no JVD, carotid bruits, or masses. Cardiac: RRR, no murmurs, rubs, or gallops. No clubbing, cyanosis, edema.  Radials/PT 2+ and equal bilaterally.  Respiratory:  Respirations regular and unlabored, clear to auscultation bilaterally. GI: Soft, nontender, nondistended. MS: No deformity or atrophy. Skin: Warm and dry, no rash. Neuro:  Strength and sensation are intact. Psych: Normal affect.  Assessment & Plan    Coronary artery disease status post STEMI 2011 with DES to RCA -Continue GDMT: Jardiance 10 mg daily, Lopressor 12.5 mg twice daily, nitroglycerin as needed,  furosemide 40 mg (recently increased) -Follow-up labs in 3 months with PCP including a BMP and BNP  Hypercholesterolemia -Referral placed to Pharm.D. for review of cholesterol medication options.  His only option would be the injectables.  He has not been able to tolerate statins and now is not tolerating Zetia. -Recent LDL 11/07/2021 was 190, total cholesterol 249, HDL 31 -He is open to taking a cholesterol medication however, he has had issues with his legs in the past and has severe arthritis  Hypertension -Blood pressure well controlled today at 112/72 -Continue to monitor blood pressure at home -Continue current medication regimen  4. SOB/volume overload -Reviewed echocardiogram from hospitalization 11/05/2021 with no evidence of pericardial effusion -Reviewed chest x-ray from that same admission with perhaps a small left pleural effusion but nothing that would be large enough for thoracentesis -Agree with doubling Lasix at this time -Follow-up BMP and BNP with primary -daily weights -compression socks offered and handout given        Disposition: Follow up 1-2 months with Candee Furbish, MD or APP.  Signed, Elgie Collard, PA-C 02/15/2022, 9:58 AM [place]

## 2022-02-15 ENCOUNTER — Encounter: Payer: Self-pay | Admitting: Physician Assistant

## 2022-02-15 ENCOUNTER — Ambulatory Visit: Payer: Medicare HMO | Admitting: Physician Assistant

## 2022-02-15 VITALS — BP 112/72 | HR 75 | Ht 67.0 in | Wt 199.6 lb

## 2022-02-15 DIAGNOSIS — R600 Localized edema: Secondary | ICD-10-CM

## 2022-02-15 DIAGNOSIS — E785 Hyperlipidemia, unspecified: Secondary | ICD-10-CM

## 2022-02-15 DIAGNOSIS — R0602 Shortness of breath: Secondary | ICD-10-CM | POA: Diagnosis not present

## 2022-02-15 DIAGNOSIS — I10 Essential (primary) hypertension: Secondary | ICD-10-CM | POA: Diagnosis not present

## 2022-02-15 DIAGNOSIS — I2510 Atherosclerotic heart disease of native coronary artery without angina pectoris: Secondary | ICD-10-CM

## 2022-02-15 NOTE — Patient Instructions (Addendum)
Medication Instructions:  1.You may take PLAIN mucinex, but not mucinex-d *If you need a refill on your cardiac medications before your next appointment, please call your pharmacy*   Lab Work: Have your primary care provider fax [place] a copy of your lab work when you have it done [phone] If you have labs (blood work) drawn today and your tests are completely normal, you will receive your results only by: [place] (if you have MyChart) OR A paper copy in the mail If you have any lab test that is abnormal or we need to change your treatment, we will call you to review the results.  Follow-Up: At [facility], you and your health needs are our priority.  As part of our continuing mission to provide you with exceptional heart care, we have created designated Provider Care Teams.  These Care Teams include your primary Cardiologist (physician) and Advanced Practice Providers (APPs -  Physician Assistants and Nurse Practitioners) who all work together to provide you with the care you need, when you need it.  Your next appointment:   1-2 month(s)  The format for your next appointment:   In Person  Provider:   Candee Furbish, MD {  Other Instructions 1.You have been referred to see the pharmacists here in our office to discuss options for lowering your cholesterol  2.Your physician recommends that you weigh every morning in the same amount of clothing, after using the restroom, before breakfast. Keep a log of your readings.  3.Consider purchasing compression socks/stockings as discussed    Important Information About Sugar

## 2022-03-10 DIAGNOSIS — N1832 Chronic kidney disease, stage 3b: Secondary | ICD-10-CM | POA: Diagnosis not present

## 2022-03-10 DIAGNOSIS — N2581 Secondary hyperparathyroidism of renal origin: Secondary | ICD-10-CM | POA: Diagnosis not present

## 2022-03-10 DIAGNOSIS — I509 Heart failure, unspecified: Secondary | ICD-10-CM | POA: Diagnosis not present

## 2022-03-10 DIAGNOSIS — D631 Anemia in chronic kidney disease: Secondary | ICD-10-CM | POA: Diagnosis not present

## 2022-03-10 DIAGNOSIS — I129 Hypertensive chronic kidney disease with stage 1 through stage 4 chronic kidney disease, or unspecified chronic kidney disease: Secondary | ICD-10-CM | POA: Diagnosis not present

## 2022-03-14 DIAGNOSIS — Z20828 Contact with and (suspected) exposure to other viral communicable diseases: Secondary | ICD-10-CM | POA: Diagnosis not present

## 2022-03-15 ENCOUNTER — Other Ambulatory Visit: Payer: Self-pay | Admitting: Nephrology

## 2022-03-15 DIAGNOSIS — N1832 Chronic kidney disease, stage 3b: Secondary | ICD-10-CM

## 2022-03-16 ENCOUNTER — Ambulatory Visit
Admission: RE | Admit: 2022-03-16 | Discharge: 2022-03-16 | Disposition: A | Discharge status: Home / Self Care | Payer: Medicare HMO | Source: Ambulatory Visit | Attending: Nephrology | Admitting: Nephrology

## 2022-03-16 DIAGNOSIS — N400 Enlarged prostate without lower urinary tract symptoms: Secondary | ICD-10-CM | POA: Diagnosis not present

## 2022-03-16 DIAGNOSIS — K8020 Calculus of gallbladder without cholecystitis without obstruction: Secondary | ICD-10-CM | POA: Diagnosis not present

## 2022-03-16 DIAGNOSIS — N1832 Chronic kidney disease, stage 3b: Secondary | ICD-10-CM

## 2022-03-16 DIAGNOSIS — N189 Chronic kidney disease, unspecified: Secondary | ICD-10-CM | POA: Diagnosis not present

## 2022-03-17 ENCOUNTER — Ambulatory Visit: Payer: Medicare HMO

## 2022-03-22 DIAGNOSIS — L570 Actinic keratosis: Secondary | ICD-10-CM | POA: Diagnosis not present

## 2022-03-22 DIAGNOSIS — X32XXXA Exposure to sunlight, initial encounter: Secondary | ICD-10-CM | POA: Diagnosis not present

## 2022-03-22 DIAGNOSIS — C44629 Squamous cell carcinoma of skin of left upper limb, including shoulder: Secondary | ICD-10-CM | POA: Diagnosis not present

## 2022-03-24 ENCOUNTER — Ambulatory Visit: Payer: Medicare HMO | Admitting: Pulmonary Disease

## 2022-04-16 DIAGNOSIS — L84 Corns and callosities: Secondary | ICD-10-CM | POA: Diagnosis not present

## 2022-04-16 DIAGNOSIS — I872 Venous insufficiency (chronic) (peripheral): Secondary | ICD-10-CM | POA: Diagnosis not present

## 2022-04-16 DIAGNOSIS — E1140 Type 2 diabetes mellitus with diabetic neuropathy, unspecified: Secondary | ICD-10-CM | POA: Diagnosis not present

## 2022-04-18 ENCOUNTER — Ambulatory Visit: Payer: Medicare HMO | Admitting: Cardiology

## 2022-05-03 DIAGNOSIS — F418 Other specified anxiety disorders: Secondary | ICD-10-CM | POA: Diagnosis not present

## 2022-05-03 DIAGNOSIS — E1122 Type 2 diabetes mellitus with diabetic chronic kidney disease: Secondary | ICD-10-CM | POA: Diagnosis not present

## 2022-05-03 DIAGNOSIS — Z08 Encounter for follow-up examination after completed treatment for malignant neoplasm: Secondary | ICD-10-CM | POA: Diagnosis not present

## 2022-05-03 DIAGNOSIS — R0602 Shortness of breath: Secondary | ICD-10-CM | POA: Diagnosis not present

## 2022-05-03 DIAGNOSIS — N1832 Chronic kidney disease, stage 3b: Secondary | ICD-10-CM | POA: Diagnosis not present

## 2022-05-03 DIAGNOSIS — R7989 Other specified abnormal findings of blood chemistry: Secondary | ICD-10-CM | POA: Diagnosis not present

## 2022-05-03 DIAGNOSIS — R69 Illness, unspecified: Secondary | ICD-10-CM | POA: Diagnosis not present

## 2022-05-03 DIAGNOSIS — E7800 Pure hypercholesterolemia, unspecified: Secondary | ICD-10-CM | POA: Diagnosis not present

## 2022-05-03 DIAGNOSIS — I10 Essential (primary) hypertension: Secondary | ICD-10-CM | POA: Diagnosis not present

## 2022-05-03 DIAGNOSIS — Z85828 Personal history of other malignant neoplasm of skin: Secondary | ICD-10-CM | POA: Diagnosis not present

## 2022-05-10 DIAGNOSIS — I2510 Atherosclerotic heart disease of native coronary artery without angina pectoris: Secondary | ICD-10-CM | POA: Diagnosis not present

## 2022-05-10 DIAGNOSIS — Z23 Encounter for immunization: Secondary | ICD-10-CM | POA: Diagnosis not present

## 2022-05-10 DIAGNOSIS — M130 Polyarthritis, unspecified: Secondary | ICD-10-CM | POA: Diagnosis not present

## 2022-05-10 DIAGNOSIS — R69 Illness, unspecified: Secondary | ICD-10-CM | POA: Diagnosis not present

## 2022-05-10 DIAGNOSIS — D692 Other nonthrombocytopenic purpura: Secondary | ICD-10-CM | POA: Diagnosis not present

## 2022-05-10 DIAGNOSIS — T466X5S Adverse effect of antihyperlipidemic and antiarteriosclerotic drugs, sequela: Secondary | ICD-10-CM | POA: Diagnosis not present

## 2022-05-10 DIAGNOSIS — K219 Gastro-esophageal reflux disease without esophagitis: Secondary | ICD-10-CM | POA: Diagnosis not present

## 2022-05-10 DIAGNOSIS — E1122 Type 2 diabetes mellitus with diabetic chronic kidney disease: Secondary | ICD-10-CM | POA: Diagnosis not present

## 2022-05-10 DIAGNOSIS — M791 Myalgia, unspecified site: Secondary | ICD-10-CM | POA: Diagnosis not present

## 2022-05-10 DIAGNOSIS — J45909 Unspecified asthma, uncomplicated: Secondary | ICD-10-CM | POA: Diagnosis not present

## 2022-05-10 DIAGNOSIS — G629 Polyneuropathy, unspecified: Secondary | ICD-10-CM | POA: Diagnosis not present

## 2022-05-10 DIAGNOSIS — N1832 Chronic kidney disease, stage 3b: Secondary | ICD-10-CM | POA: Diagnosis not present

## 2022-06-24 DIAGNOSIS — R079 Chest pain, unspecified: Secondary | ICD-10-CM | POA: Diagnosis not present

## 2022-08-25 DIAGNOSIS — I2510 Atherosclerotic heart disease of native coronary artery without angina pectoris: Secondary | ICD-10-CM | POA: Diagnosis not present

## 2022-08-25 DIAGNOSIS — E7800 Pure hypercholesterolemia, unspecified: Secondary | ICD-10-CM | POA: Diagnosis not present

## 2022-08-25 DIAGNOSIS — I10 Essential (primary) hypertension: Secondary | ICD-10-CM | POA: Diagnosis not present

## 2022-08-25 DIAGNOSIS — E1142 Type 2 diabetes mellitus with diabetic polyneuropathy: Secondary | ICD-10-CM | POA: Diagnosis not present

## 2022-10-13 DIAGNOSIS — M5136 Other intervertebral disc degeneration, lumbar region: Secondary | ICD-10-CM | POA: Diagnosis not present

## 2022-10-13 DIAGNOSIS — N401 Enlarged prostate with lower urinary tract symptoms: Secondary | ICD-10-CM | POA: Diagnosis not present

## 2022-10-13 DIAGNOSIS — G894 Chronic pain syndrome: Secondary | ICD-10-CM | POA: Diagnosis not present

## 2022-10-13 DIAGNOSIS — K30 Functional dyspepsia: Secondary | ICD-10-CM | POA: Diagnosis not present

## 2022-10-13 DIAGNOSIS — R0602 Shortness of breath: Secondary | ICD-10-CM | POA: Diagnosis not present

## 2022-10-17 ENCOUNTER — Emergency Department (HOSPITAL_COMMUNITY): Payer: Medicare HMO

## 2022-10-17 ENCOUNTER — Encounter (HOSPITAL_COMMUNITY): Payer: Self-pay

## 2022-10-17 ENCOUNTER — Inpatient Hospital Stay (HOSPITAL_COMMUNITY): Payer: Medicare HMO

## 2022-10-17 ENCOUNTER — Inpatient Hospital Stay (HOSPITAL_COMMUNITY)
Admission: EM | Admit: 2022-10-17 | Discharge: 2022-10-26 | DRG: 208 | Disposition: E | Payer: Medicare HMO | Attending: Internal Medicine | Admitting: Internal Medicine

## 2022-10-17 ENCOUNTER — Other Ambulatory Visit: Payer: Self-pay

## 2022-10-17 DIAGNOSIS — E1165 Type 2 diabetes mellitus with hyperglycemia: Secondary | ICD-10-CM | POA: Diagnosis not present

## 2022-10-17 DIAGNOSIS — Z955 Presence of coronary angioplasty implant and graft: Secondary | ICD-10-CM

## 2022-10-17 DIAGNOSIS — R000 Tachycardia, unspecified: Secondary | ICD-10-CM | POA: Diagnosis not present

## 2022-10-17 DIAGNOSIS — R059 Cough, unspecified: Secondary | ICD-10-CM | POA: Diagnosis not present

## 2022-10-17 DIAGNOSIS — I2510 Atherosclerotic heart disease of native coronary artery without angina pectoris: Secondary | ICD-10-CM | POA: Diagnosis not present

## 2022-10-17 DIAGNOSIS — I4891 Unspecified atrial fibrillation: Secondary | ICD-10-CM | POA: Diagnosis not present

## 2022-10-17 DIAGNOSIS — I130 Hypertensive heart and chronic kidney disease with heart failure and stage 1 through stage 4 chronic kidney disease, or unspecified chronic kidney disease: Secondary | ICD-10-CM | POA: Diagnosis not present

## 2022-10-17 DIAGNOSIS — R092 Respiratory arrest: Secondary | ICD-10-CM

## 2022-10-17 DIAGNOSIS — I255 Ischemic cardiomyopathy: Secondary | ICD-10-CM | POA: Diagnosis present

## 2022-10-17 DIAGNOSIS — E1122 Type 2 diabetes mellitus with diabetic chronic kidney disease: Secondary | ICD-10-CM | POA: Diagnosis not present

## 2022-10-17 DIAGNOSIS — I080 Rheumatic disorders of both mitral and aortic valves: Secondary | ICD-10-CM | POA: Diagnosis not present

## 2022-10-17 DIAGNOSIS — I5082 Biventricular heart failure: Secondary | ICD-10-CM

## 2022-10-17 DIAGNOSIS — E7800 Pure hypercholesterolemia, unspecified: Secondary | ICD-10-CM | POA: Diagnosis present

## 2022-10-17 DIAGNOSIS — R7989 Other specified abnormal findings of blood chemistry: Secondary | ICD-10-CM | POA: Diagnosis not present

## 2022-10-17 DIAGNOSIS — Z825 Family history of asthma and other chronic lower respiratory diseases: Secondary | ICD-10-CM

## 2022-10-17 DIAGNOSIS — I509 Heart failure, unspecified: Secondary | ICD-10-CM | POA: Diagnosis not present

## 2022-10-17 DIAGNOSIS — Z87891 Personal history of nicotine dependence: Secondary | ICD-10-CM

## 2022-10-17 DIAGNOSIS — Z7984 Long term (current) use of oral hypoglycemic drugs: Secondary | ICD-10-CM

## 2022-10-17 DIAGNOSIS — I2694 Multiple subsegmental pulmonary emboli without acute cor pulmonale: Secondary | ICD-10-CM | POA: Diagnosis not present

## 2022-10-17 DIAGNOSIS — F32A Depression, unspecified: Secondary | ICD-10-CM | POA: Diagnosis not present

## 2022-10-17 DIAGNOSIS — Z86711 Personal history of pulmonary embolism: Secondary | ICD-10-CM | POA: Diagnosis not present

## 2022-10-17 DIAGNOSIS — Z515 Encounter for palliative care: Secondary | ICD-10-CM | POA: Diagnosis not present

## 2022-10-17 DIAGNOSIS — I82402 Acute embolism and thrombosis of unspecified deep veins of left lower extremity: Secondary | ICD-10-CM | POA: Diagnosis present

## 2022-10-17 DIAGNOSIS — Z1152 Encounter for screening for COVID-19: Secondary | ICD-10-CM

## 2022-10-17 DIAGNOSIS — I5023 Acute on chronic systolic (congestive) heart failure: Secondary | ICD-10-CM | POA: Diagnosis not present

## 2022-10-17 DIAGNOSIS — N1832 Chronic kidney disease, stage 3b: Secondary | ICD-10-CM | POA: Diagnosis present

## 2022-10-17 DIAGNOSIS — Z4659 Encounter for fitting and adjustment of other gastrointestinal appliance and device: Secondary | ICD-10-CM | POA: Diagnosis not present

## 2022-10-17 DIAGNOSIS — J189 Pneumonia, unspecified organism: Secondary | ICD-10-CM | POA: Diagnosis present

## 2022-10-17 DIAGNOSIS — G2581 Restless legs syndrome: Secondary | ICD-10-CM | POA: Diagnosis not present

## 2022-10-17 DIAGNOSIS — I252 Old myocardial infarction: Secondary | ICD-10-CM

## 2022-10-17 DIAGNOSIS — J302 Other seasonal allergic rhinitis: Secondary | ICD-10-CM | POA: Diagnosis present

## 2022-10-17 DIAGNOSIS — I2489 Other forms of acute ischemic heart disease: Secondary | ICD-10-CM | POA: Diagnosis not present

## 2022-10-17 DIAGNOSIS — I5043 Acute on chronic combined systolic (congestive) and diastolic (congestive) heart failure: Secondary | ICD-10-CM | POA: Diagnosis not present

## 2022-10-17 DIAGNOSIS — E871 Hypo-osmolality and hyponatremia: Secondary | ICD-10-CM | POA: Diagnosis present

## 2022-10-17 DIAGNOSIS — N183 Chronic kidney disease, stage 3 unspecified: Secondary | ICD-10-CM | POA: Diagnosis not present

## 2022-10-17 DIAGNOSIS — R739 Hyperglycemia, unspecified: Secondary | ICD-10-CM | POA: Diagnosis not present

## 2022-10-17 DIAGNOSIS — Z743 Need for continuous supervision: Secondary | ICD-10-CM | POA: Diagnosis not present

## 2022-10-17 DIAGNOSIS — Z66 Do not resuscitate: Secondary | ICD-10-CM | POA: Diagnosis not present

## 2022-10-17 DIAGNOSIS — J9690 Respiratory failure, unspecified, unspecified whether with hypoxia or hypercapnia: Secondary | ICD-10-CM | POA: Diagnosis not present

## 2022-10-17 DIAGNOSIS — I5033 Acute on chronic diastolic (congestive) heart failure: Secondary | ICD-10-CM | POA: Diagnosis not present

## 2022-10-17 DIAGNOSIS — Z9911 Dependence on respirator [ventilator] status: Secondary | ICD-10-CM | POA: Diagnosis not present

## 2022-10-17 DIAGNOSIS — J90 Pleural effusion, not elsewhere classified: Secondary | ICD-10-CM | POA: Diagnosis not present

## 2022-10-17 DIAGNOSIS — N179 Acute kidney failure, unspecified: Secondary | ICD-10-CM | POA: Diagnosis present

## 2022-10-17 DIAGNOSIS — R109 Unspecified abdominal pain: Secondary | ICD-10-CM | POA: Diagnosis not present

## 2022-10-17 DIAGNOSIS — Z8249 Family history of ischemic heart disease and other diseases of the circulatory system: Secondary | ICD-10-CM

## 2022-10-17 DIAGNOSIS — G4733 Obstructive sleep apnea (adult) (pediatric): Secondary | ICD-10-CM | POA: Diagnosis present

## 2022-10-17 DIAGNOSIS — I469 Cardiac arrest, cause unspecified: Secondary | ICD-10-CM | POA: Diagnosis not present

## 2022-10-17 DIAGNOSIS — J9601 Acute respiratory failure with hypoxia: Secondary | ICD-10-CM | POA: Diagnosis not present

## 2022-10-17 DIAGNOSIS — Z833 Family history of diabetes mellitus: Secondary | ICD-10-CM

## 2022-10-17 DIAGNOSIS — R531 Weakness: Secondary | ICD-10-CM | POA: Diagnosis not present

## 2022-10-17 DIAGNOSIS — G8929 Other chronic pain: Secondary | ICD-10-CM | POA: Diagnosis present

## 2022-10-17 DIAGNOSIS — Z91048 Other nonmedicinal substance allergy status: Secondary | ICD-10-CM

## 2022-10-17 DIAGNOSIS — R778 Other specified abnormalities of plasma proteins: Secondary | ICD-10-CM | POA: Diagnosis not present

## 2022-10-17 DIAGNOSIS — Z79899 Other long term (current) drug therapy: Secondary | ICD-10-CM

## 2022-10-17 DIAGNOSIS — I213 ST elevation (STEMI) myocardial infarction of unspecified site: Secondary | ICD-10-CM | POA: Diagnosis not present

## 2022-10-17 DIAGNOSIS — N400 Enlarged prostate without lower urinary tract symptoms: Secondary | ICD-10-CM | POA: Diagnosis present

## 2022-10-17 DIAGNOSIS — Z888 Allergy status to other drugs, medicaments and biological substances status: Secondary | ICD-10-CM

## 2022-10-17 DIAGNOSIS — K8020 Calculus of gallbladder without cholecystitis without obstruction: Secondary | ICD-10-CM | POA: Diagnosis not present

## 2022-10-17 DIAGNOSIS — J309 Allergic rhinitis, unspecified: Secondary | ICD-10-CM | POA: Diagnosis present

## 2022-10-17 DIAGNOSIS — R0602 Shortness of breath: Secondary | ICD-10-CM | POA: Diagnosis not present

## 2022-10-17 DIAGNOSIS — J9811 Atelectasis: Secondary | ICD-10-CM | POA: Diagnosis not present

## 2022-10-17 LAB — ECHOCARDIOGRAM LIMITED
Height: 69 in
Weight: 3120 oz

## 2022-10-17 LAB — URINALYSIS, ROUTINE W REFLEX MICROSCOPIC
Bacteria, UA: NONE SEEN
Bilirubin Urine: NEGATIVE
Glucose, UA: >=500 mg/dL — AB
Ketones, ur: 20 mg/dL — AB
Leukocytes,Ua: NEGATIVE
Nitrite: NEGATIVE
Protein, ur: 100 mg/dL — AB
Renal Epithelial: <1
Specific Gravity, Urine: 1.03 (ref 1.005–1.030)
pH: 5 (ref 5.0–8.0)

## 2022-10-17 LAB — BASIC METABOLIC PANEL
Anion gap: 14 (ref 5–15)
BUN: 36 mg/dL — ABNORMAL HIGH (ref 8–23)
CO2: 17 mmol/L — ABNORMAL LOW (ref 22–32)
Calcium: 9.7 mg/dL (ref 8.9–10.3)
Chloride: 101 mmol/L (ref 98–111)
Creatinine, Ser: 1.63 mg/dL — ABNORMAL HIGH (ref 0.61–1.24)
GFR, Estimated: 42 mL/min — ABNORMAL LOW (ref >60)
Glucose, Bld: 412 mg/dL — ABNORMAL HIGH (ref 70–99)
Potassium: 4.6 mmol/L (ref 3.5–5.1)
Sodium: 132 mmol/L — ABNORMAL LOW (ref 135–145)

## 2022-10-17 LAB — ECHOCARDIOGRAM COMPLETE
Area-P 1/2: 4.74 cm2
Calc EF: 26.4 %
Height: 69 in
MV M vel: 4.76 m/s
MV Peak grad: 90.6 mmHg
Radius: 0.5 cm
S' Lateral: 4.6 cm
Single Plane A2C EF: 29.3 %
Single Plane A4C EF: 21.1 %
Weight: 3120 oz

## 2022-10-17 LAB — CBC
HCT: 34.6 % — ABNORMAL LOW (ref 39.0–52.0)
Hemoglobin: 11.3 g/dL — ABNORMAL LOW (ref 13.0–17.0)
MCH: 29.1 pg (ref 26.0–34.0)
MCHC: 32.7 g/dL (ref 30.0–36.0)
MCV: 89.2 fL (ref 80.0–100.0)
Platelets: 201 10*3/uL (ref 150–400)
RBC: 3.88 MIL/uL — ABNORMAL LOW (ref 4.22–5.81)
RDW: 13.2 % (ref 11.5–15.5)
WBC: 9.5 10*3/uL (ref 4.0–10.5)
nRBC: 0 % (ref 0.0–0.2)

## 2022-10-17 LAB — BRAIN NATRIURETIC PEPTIDE: B Natriuretic Peptide: 761.8 pg/mL — ABNORMAL HIGH (ref 0.0–100.0)

## 2022-10-17 LAB — HEMOGLOBIN A1C
Hgb A1c MFr Bld: 12.5 % — ABNORMAL HIGH (ref 4.8–5.6)
Mean Plasma Glucose: 312.05 mg/dL

## 2022-10-17 LAB — HEPATIC FUNCTION PANEL
ALT: 11 U/L (ref 0–44)
AST: 14 U/L — ABNORMAL LOW (ref 15–41)
Albumin: 3.5 g/dL (ref 3.5–5.0)
Alkaline Phosphatase: 79 U/L (ref 38–126)
Bilirubin, Direct: 0.2 mg/dL (ref 0.0–0.2)
Indirect Bilirubin: 0.8 mg/dL (ref 0.3–0.9)
Total Bilirubin: 1 mg/dL (ref 0.3–1.2)
Total Protein: 7.1 g/dL (ref 6.5–8.1)

## 2022-10-17 LAB — PROCALCITONIN: Procalcitonin: <0.1 ng/mL

## 2022-10-17 LAB — RESP PANEL BY RT-PCR (RSV, FLU A&B, COVID)  RVPGX2
Influenza A by PCR: NEGATIVE
Influenza B by PCR: NEGATIVE
Resp Syncytial Virus by PCR: NEGATIVE
SARS Coronavirus 2 by RT PCR: NEGATIVE

## 2022-10-17 LAB — CBG MONITORING, ED
Glucose-Capillary: 393 mg/dL — ABNORMAL HIGH (ref 70–99)
Glucose-Capillary: 316 mg/dL — ABNORMAL HIGH (ref 70–99)

## 2022-10-17 LAB — TROPONIN I (HIGH SENSITIVITY)
Troponin I (High Sensitivity): 241 ng/L (ref <18)
Troponin I (High Sensitivity): 212 ng/L (ref <18)

## 2022-10-17 LAB — LACTIC ACID, PLASMA
Lactic Acid, Venous: 1.6 mmol/L (ref 0.5–1.9)
Lactic Acid, Venous: 2 mmol/L (ref 0.5–1.9)

## 2022-10-17 LAB — D-DIMER, QUANTITATIVE: D-Dimer, Quant: 2.44 ug/mL-FEU — ABNORMAL HIGH (ref 0.00–0.50)

## 2022-10-17 LAB — LIPASE, BLOOD: Lipase: 30 U/L (ref 11–51)

## 2022-10-17 LAB — HEPARIN LEVEL (UNFRACTIONATED): Heparin Unfractionated: 0.37 IU/mL (ref 0.30–0.70)

## 2022-10-17 MED ORDER — SODIUM CHLORIDE 0.9 % IV SOLN
1.0000 g | Freq: Once | INTRAVENOUS | Status: AC
  Administered 2022-10-17: 1 g via INTRAVENOUS
  Filled 2022-10-17: qty 10

## 2022-10-17 MED ORDER — ALBUTEROL SULFATE (2.5 MG/3ML) 0.083% IN NEBU
2.5000 mg | INHALATION_SOLUTION | Freq: Four times a day (QID) | RESPIRATORY_TRACT | Status: DC | PRN
  Filled 2022-10-17: qty 3

## 2022-10-17 MED ORDER — ACETAMINOPHEN 650 MG RE SUPP: 650.0000 mg | Freq: Four times a day (QID) | RECTAL | Status: DC | PRN

## 2022-10-17 MED ORDER — ALPRAZOLAM 0.5 MG PO TABS
0.5000 mg | ORAL_TABLET | Freq: Every day | ORAL | Status: DC
  Administered 2022-10-17 – 2022-10-18 (×2): 0.5 mg via ORAL
  Filled 2022-10-17: qty 1
  Filled 2022-10-17: qty 2

## 2022-10-17 MED ORDER — INSULIN ASPART 100 UNIT/ML IJ SOLN
0.0000 [IU] | Freq: Three times a day (TID) | INTRAMUSCULAR | Status: DC
  Administered 2022-10-17: 11 [IU] via SUBCUTANEOUS
  Administered 2022-10-18 (×2): 5 [IU] via SUBCUTANEOUS
  Administered 2022-10-18: 3 [IU] via SUBCUTANEOUS
  Administered 2022-10-19 (×3): 5 [IU] via SUBCUTANEOUS

## 2022-10-17 MED ORDER — METOPROLOL TARTRATE 25 MG PO TABS
25.0000 mg | ORAL_TABLET | Freq: Two times a day (BID) | ORAL | Status: DC
  Administered 2022-10-18: 25 mg via ORAL
  Filled 2022-10-17: qty 1

## 2022-10-17 MED ORDER — METOPROLOL TARTRATE 25 MG PO TABS
12.5000 mg | ORAL_TABLET | Freq: Once | ORAL | Status: AC
  Administered 2022-10-17: 12.5 mg via ORAL
  Filled 2022-10-17: qty 1

## 2022-10-17 MED ORDER — SODIUM CHLORIDE 0.9 % IV SOLN
500.0000 mg | Freq: Once | INTRAVENOUS | Status: AC
  Administered 2022-10-17: 500 mg via INTRAVENOUS
  Filled 2022-10-17: qty 5

## 2022-10-17 MED ORDER — GABAPENTIN 300 MG PO CAPS
300.0000 mg | ORAL_CAPSULE | Freq: Every day | ORAL | Status: DC
  Administered 2022-10-17 – 2022-10-18 (×2): 300 mg via ORAL
  Filled 2022-10-17 (×2): qty 1

## 2022-10-17 MED ORDER — FUROSEMIDE 10 MG/ML IJ SOLN
40.0000 mg | Freq: Two times a day (BID) | INTRAMUSCULAR | Status: DC
  Administered 2022-10-17 – 2022-10-18 (×2): 40 mg via INTRAVENOUS
  Filled 2022-10-17 (×2): qty 4

## 2022-10-17 MED ORDER — ENOXAPARIN SODIUM 40 MG/0.4ML IJ SOSY
40.0000 mg | PREFILLED_SYRINGE | INTRAMUSCULAR | Status: DC
Start: 2022-10-17 — End: 2022-10-17

## 2022-10-17 MED ORDER — HEPARIN (PORCINE) 25000 UT/250ML-% IV SOLN
1500.0000 [IU]/h | INTRAVENOUS | Status: DC
  Administered 2022-10-17 – 2022-10-18 (×2): 1350 [IU]/h via INTRAVENOUS
  Administered 2022-10-19 – 2022-10-20 (×2): 1500 [IU]/h via INTRAVENOUS
  Filled 2022-10-17 (×4): qty 250

## 2022-10-17 MED ORDER — IOHEXOL 350 MG/ML SOLN
60.0000 mL | Freq: Once | INTRAVENOUS | Status: AC | PRN
  Administered 2022-10-17: 60 mL via INTRAVENOUS

## 2022-10-17 MED ORDER — ACETAMINOPHEN 325 MG PO TABS
650.0000 mg | ORAL_TABLET | Freq: Four times a day (QID) | ORAL | Status: DC | PRN
  Administered 2022-10-17: 650 mg via ORAL
  Filled 2022-10-17: qty 2

## 2022-10-17 MED ORDER — DILTIAZEM HCL 25 MG/5ML IV SOLN
20.0000 mg | Freq: Once | INTRAVENOUS | Status: AC
  Administered 2022-10-17: 20 mg via INTRAVENOUS
  Filled 2022-10-17: qty 5

## 2022-10-17 MED ORDER — SODIUM CHLORIDE 0.9% FLUSH
3.0000 mL | Freq: Two times a day (BID) | INTRAVENOUS | Status: DC
  Administered 2022-10-17 – 2022-10-18 (×3): 3 mL via INTRAVENOUS

## 2022-10-17 MED ORDER — INSULIN ASPART 100 UNIT/ML IJ SOLN
0.0000 [IU] | Freq: Every day | INTRAMUSCULAR | Status: DC
  Administered 2022-10-17: 2 [IU] via SUBCUTANEOUS

## 2022-10-17 MED ORDER — HEPARIN BOLUS VIA INFUSION
4500.0000 [IU] | Freq: Once | INTRAVENOUS | Status: AC
  Administered 2022-10-17: 4500 [IU] via INTRAVENOUS
  Filled 2022-10-17: qty 4500

## 2022-10-17 MED ORDER — METOPROLOL TARTRATE 25 MG PO TABS
12.5000 mg | ORAL_TABLET | Freq: Two times a day (BID) | ORAL | Status: DC
  Administered 2022-10-17: 12.5 mg via ORAL
  Filled 2022-10-17: qty 1

## 2022-10-17 NOTE — ED Notes (Signed)
Spoke with Zachary Nolan, lab, added on HFP and lipase

## 2022-10-17 NOTE — ED Notes (Signed)
Unsuccessful use of BSC. Unable to measure urine.

## 2022-10-17 NOTE — ED Notes (Signed)
Up to [facility] with assistance, with difficulty and exacerbation of sob/ DOE. HR 112, SPO2 95-97% on RA.

## 2022-10-17 NOTE — ED Triage Notes (Addendum)
Patient arrived by [facility] from home and reports declining health for several years. Reports he has increased general weakness for 2 months and finds himself sleeping more and some exertional sob. EMS reports hx a. Fib and patient states he has no hx. Alert and oriented, NAD. Patient also reports periods of incontinence over the past week but denies dysuria

## 2022-10-17 NOTE — Consult Note (Addendum)
Cardiology Consultation   Patient ID: Zachary Nolan MRN: [id number]; DOB: February 13, 1941  Admit date: 10/06/2022 Date of Consult: 09/30/2022  PCP:  Linus Galas, NP   [facility] Prov[facility] Cardiologist:  Donato Schultz, MD     Patient Profile:   Zachary Nolan is a 82 y.o. male with a hx of CAD s/p inferior STEMI w/ DES to RCA '11, ischemic cardiomyopathy, diabetes, hypertension, hyperlipidemia who is being seen 10/14/2022 for the evaluation of atrial fibrillation at the request of Dr. Katrinka Blazing.  History of Present Illness:   Zachary Nolan 82 year old male with past medical history noted above.  Known history of CAD with an inferior STEMI in 2011 with drug-eluting stent to the RCA, 70 to 75% LAD and mid circumflex with 70% disease treated medically.  EF at that time was noted at 35 to 40% which improved on nuclear study in 2014.  History of sleep apnea but troubles with using CPAP, remotely seen by Dr. Mayford Knife.  Seen in the office on 08/2021 with Dr. Anne Fu and was continued on medications with the addition of Crestor 10 mg daily.   Admitted 10/2021 with dyspnea and elevated troponin.  He was evaluated by cardiology and found to have flat minimally elevated troponin in the 60s.  Echocardiogram showed LVEF of 60 to 65%, no regional wall motion normality, grade 1 diastolic dysfunction, normal RV size and function, moderately elevated left atrium, no aortic stenosis.  He was set up for outpatient cardiac PET scan.  He was lost to follow-up.  Presented to the ED on 10/16/2020 with complaint of shortness of breath, fatigue.  In talking with the patient and his family he has felt short of breath with increasing lower extremity edema for the past several weeks.  Wife reports she woke around 3 AM this morning and noticed he was in respiratory distress.  Denies any chest pain.    Labs in the ED showed sodium 132, potassium 4.6, creatinine 1.6, BNP 761, high-sensitivity troponin 241>> 212, lactic acid 1.6, WBC 9.5,  hemoglobin 11.3, D-dimer 2.44.  CT angio chest with intraluminal filling defects in segmental and subsegmental branches in the right lower lobe with small thrombus burden.  Small right pleural effusion.  EKG showed atrial fibrillation with RVR, 128 bpm.  Started on IV heparin and admitted to internal medicine for further management.  Cardiology asked to evaluate in regards to new atrial fibrillation.  Past Medical History:  Diagnosis Date   CAD (coronary artery disease)    a. LHC (7/11):  Inf STEMI >>> inf AK, EF 35-40%, LAD 70-75%, mid CFX 70%, dist RCA 99% >>> PCI:  3.5 x 28 mm Promus DES to RCA;     Cancer    skin   Depression    Diabetes mellitus    Non-insulin-dependent diabetes mellitus.    DJD (degenerative joint disease)    HTN (hypertension)    Hx of cardiovascular stress test    a. Nuclear (3/14):  Small inf defect - likely scar; no ischemia, EF 59%; LOW RISK;  b. Lexiscan Myoview (1/16): No ischemia, fixed inferior defect consistent with prior infarct versus diaphragmatic attenuation, EF 57%, Low Risk   Hyperlipemia    Ischemic cardiomyopathy    EF 35-40% at time of MI in 2011 >> improved to normal on Nuclear study in 2014   OSA (obstructive sleep apnea) 09/10/2014   severe with AHI 68/hr   RLS (restless legs syndrome)    Seasonal allergies     Past Surgical History:  Procedure Laterality Date   COLONOSCOPY     NOSE SURGERY     Percutaneous coronary intervention using a drug-eluting stent (Promus)     Moderately    severe left anterior descending stenosis.  Moderate left     circumflex stenosis, moderate left ventricular dysfunction with   left  ventricular ejection fraction of 35% to 40%.    Release of left transcarpal ligament.     Release of right transcarpal ligament.     TONSILLECTOMY       Home Medications:  Prior to Admission medications   Medication Sig Start Date End Date Taking? Authorizing Provider  acetaminophen (TYLENOL) 650 MG CR tablet Take 650-1,300 mg  by mouth every 8 (eight) hours as needed for pain.   Yes [provider]  ALPRAZolam Prudy Feeler) 0.5 MG tablet Take 0.5 mg by mouth at bedtime.   Yes [provider]  furosemide (LASIX) 20 MG tablet Take 40 mg by mouth as needed. 01/31/22  Yes [provider]  gabapentin (NEURONTIN) 300 MG capsule Take 300 mg by mouth at bedtime. 10/22/21  Yes [provider]  JARDIANCE 10 MG TABS tablet Take 10 mg by mouth daily. 08/23/21  Yes [provider]  metFORMIN (GLUCOPHAGE) 1000 MG tablet Take 1 tablet by mouth daily at 6 (six) AM.   Yes [provider]  metoprolol tartrate (LOPRESSOR) 25 MG tablet Take 1/2 tablet (12.5 mg total) by mouth 2 (two) times daily. 11/08/21  Yes Amin, Loura Halt, MD  traMADol (ULTRAM) 50 MG tablet Take 50 mg by mouth daily at 6 (six) AM. 01/27/22  Yes [provider]  VENTOLIN HFA 108 (90 Base) MCG/ACT inhaler Inhale 2 puffs into the lungs every 4 (four) hours as needed for wheezing or shortness of breath. 08/27/15  Yes [provider]    Inpatient Medications: Scheduled Meds:  ALPRAZolam  0.5 mg Oral QHS   furosemide  40 mg Intravenous BID   insulin aspart  0-15 Units Subcutaneous TID WC   insulin aspart  0-5 Units Subcutaneous QHS   metoprolol tartrate  12.5 mg Oral BID   sodium chloride flush  3 mL Intravenous Q12H   Continuous Infusions:  heparin 1,350 Units/hr (10/06/2022 1131)   PRN Meds: acetaminophen **OR** acetaminophen, albuterol  Allergies:    Allergies  Allergen Reactions   Dust Mite Extract     Stuffy head   Pollen Extract     Stuffy head   Shellfish-Derived Products Swelling    JUST CRAB MEAT   Statins Other (See Comments)    Dizziness and myalgias    Social History:   Social History   Socioeconomic History   Marital status: Married    Spouse name: Not on file   Number of children: Not on file   Years of education: Not on file   Highest education level: Not on file   Occupational History   Not on file  Tobacco Use   Smoking status: Former    Types: Cigarettes    Quit date: 1968    Years since quitting: 56.3   Smokeless tobacco: Never  Vaping Use   Vaping Use: Never used  Substance and Sexual Activity   Alcohol use: No   Drug use: No   Sexual activity: Not on file  Other Topics Concern   Not on file  Social History Narrative   Not on file   Social Determinants of Health   Financial Resource Strain: Not on file  Food Insecurity: Not  on file  Transportation Needs: Not on file  Physical Activity: Not on file  Stress: Not on file  Social Connections: Not on file  Intimate Partner Violence: Not on file    Family History:    Family History  Problem Relation Age of Onset   Hypertension Mother    Asthma Mother    Heart attack Father    Hypertension Father    Diabetes Father    Coronary artery disease Unknown    Heart attack Brother    Hypertension Brother    Stroke Neg Hx      ROS:  Please see the history of present illness.   All other ROS reviewed and negative.     Physical Exam/Data:   Vitals:   Nov 07, 2022 1415 11/07/2022 1430 11/07/2022 1445 11-07-22 1500  BP: (!) 114/92 (!) 127/92 126/88 121/84  Pulse: (!) 102 96 (!) 103 93  Resp: 19 (!) 25 (!) 22 (!) 31  Temp:      TempSrc:      SpO2: 99% 98% 100% 98%  Weight:      Height:        Intake/Output Summary (Last 24 hours) at 11/07/2022 1545 Last data filed at Nov 07, 2022 1404 Gross per 24 hour  Intake 350 ml  Output --  Net 350 ml      2022/11/07    9:34 AM 02/15/2022    8:38 AM 12/22/2021   11:51 AM  Last 3 Weights  Weight (lbs) 195 lb 199 lb 9.6 oz 197 lb 4 oz  Weight (kg) 88.451 kg 90.538 kg 89.472 kg     Body mass index is 28.8 kg/m.  General: Obese older male, increased work of breathing  HEENT: normal Neck: + JVD Vascular: No carotid bruits; Distal pulses 2+ bilaterally Cardiac:  normal S1, S2; irregularly irregular; soft systolic murmur  Lungs: Diminished  in bases Abd: soft, nontender, no hepatomegaly  Ext: 2+ bilateral lower extremity weight 1 edema Musculoskeletal:  No deformities, BUE and BLE strength normal and equal Skin: warm and dry  Neuro:  CNs 2-12 intact, no focal abnormalities noted Psych:  Normal affect   EKG:  The EKG was personally reviewed and demonstrates:  atrial fibrillation with RVR, 128 bpm Telemetry:  Telemetry was personally reviewed and demonstrates:  atrial fibrillation with rates in the 100-110s  Relevant CV Studies:  Echo: 10/2021  IMPRESSIONS     1. Left ventricular ejection fraction, by estimation, is 60 to 65%. The  left ventricle has normal function. The left ventricle has no regional  wall motion abnormalities. There is mild left ventricular hypertrophy.  Left ventricular diastolic parameters  are consistent with Grade I diastolic dysfunction (impaired relaxation).   2. Right ventricular systolic function is normal. The right ventricular  size is normal. Tricuspid regurgitation signal is inadequate for assessing  PA pressure.   3. Left atrial size was moderately dilated.   4. The mitral valve is normal in structure. No evidence of mitral valve  regurgitation. No evidence of mitral stenosis.   5. The aortic valve has an indeterminant number of cusps. There is mild  calcification of the aortic valve. There is mild thickening of the aortic  valve. Aortic valve regurgitation is mild. Mild to moderate aortic valve  stenosis. Aortic valve mean  gradient measures 17.0 mmHg. Aortic valve peak gradient measures 29.8  mmHg. Aortic valve area, by VTI measures 1.23 cm.   6. The inferior vena cava is normal in size with greater than 50%  respiratory variability, suggesting right atrial pressure of 3 mmHg.   FINDINGS   Left Ventricle: Left ventricular ejection fraction, by estimation, is 60  to 65%. The left ventricle has normal function. The left ventricle has no  regional wall motion abnormalities. The left  ventricular internal cavity  size was normal in size. There is   mild left ventricular hypertrophy. Left ventricular diastolic parameters  are consistent with Grade I diastolic dysfunction (impaired relaxation).   Right Ventricle: The right ventricular size is normal. No increase in  right ventricular wall thickness. Right ventricular systolic function is  normal. Tricuspid regurgitation signal is inadequate for assessing PA  pressure.   Left Atrium: Left atrial size was moderately dilated.   Right Atrium: Right atrial size was normal in size.   Pericardium: There is no evidence of pericardial effusion.   Mitral Valve: The mitral valve is normal in structure. No evidence of  mitral valve regurgitation. No evidence of mitral valve stenosis.   Tricuspid Valve: The tricuspid valve is normal in structure. Tricuspid  valve regurgitation is not demonstrated. No evidence of tricuspid  stenosis.   Aortic Valve: The aortic valve has an indeterminant number of cusps. There  is mild calcification of the aortic valve. There is mild thickening of the  aortic valve. There is mild aortic valve annular calcification. Aortic  valve regurgitation is mild.  Aortic regurgitation PHT measures 529 msec. Mild to moderate aortic  stenosis is present. Aortic valve mean gradient measures 17.0 mmHg. Aortic  valve peak gradient measures 29.8 mmHg. Aortic valve area, by VTI measures  1.23 cm.   Pulmonic Valve: The pulmonic valve was not well visualized. Pulmonic valve  regurgitation is trivial. No evidence of pulmonic stenosis.   Aorta: The aortic root is normal in size and structure.   Venous: The inferior vena cava is normal in size with greater than 50%  respiratory variability, suggesting right atrial pressure of 3 mmHg.   IAS/Shunts: No atrial level shunt detected by color flow Doppler.    Laboratory Data:  High Sensitivity Troponin:   Recent Labs  Lab 10/19/2022 0850 10/09/2022 1002   TROPONINIHS 241* 212*     Chemistry Recent Labs  Lab 10/23/2022 0850  NA 132*  K 4.6  CL 101  CO2 17*  GLUCOSE 412*  BUN 36*  CREATININE 1.63*  CALCIUM 9.7  GFRNONAA 42*  ANIONGAP 14    Recent Labs  Lab 10/01/2022 1013  PROT 7.1  ALBUMIN 3.5  AST 14*  ALT 11  ALKPHOS 79  BILITOT 1.0   Lipids No results for input(s): "CHOL", "TRIG", "HDL", "LABVLDL", "LDLCALC", "CHOLHDL" in the last 168 hours.  Hematology Recent Labs  Lab 10/15/2022 0850  WBC 9.5  RBC 3.88*  HGB 11.3*  HCT 34.6*  MCV 89.2  MCH 29.1  MCHC 32.7  RDW 13.2  PLT 201   Thyroid No results for input(s): "TSH", "FREET4" in the last 168 hours.  BNP Recent Labs  Lab 10/24/2022 0850  BNP 761.8*    DDimer  Recent Labs  Lab 10/05/2022 1002  DDIMER 2.44*     Radiology/Studies:  CT Angio Chest PE W and/or Wo Contrast  Result Date: 10/02/2022 CLINICAL DATA:  Shortness of breath EXAM: CT ANGIOGRAPHY CHEST WITH CONTRAST TECHNIQUE: Multidetector CT imaging of the chest was performed using the standard protocol during bolus administration of intravenous contrast. Multiplanar CT image reconstructions and MIPs were obtained to evaluate the vascular anatomy. RADIATION DOSE REDUCTION: This exam was performed according to  the departmental dose-optimization program which includes automated exposure control, adjustment of the mA and/or kV according to patient size and/or use of iterative reconstruction technique. CONTRAST:  60mL OMNIPAQUE IOHEXOL 350 MG/ML SOLN COMPARISON:  Chest radiographs done earlier today FINDINGS: Cardiovascular: Heart is enlarged in size. Extensive coronary artery calcifications are seen. RV LV ratio is 1.2. Contrast enhancement in the thoracic aorta is less than adequate to evaluate the lumen. There are intraluminal filling defects seen segmental and subsegmental pulmonary artery branches in right lower lobe. Mediastinum/Nodes: There are subcentimeter nodes in mediastinum and hilar regions. There is  prominent pericardial recess. Lungs/Pleura: There are linear patchy infiltrates in right middle lobe and right lower lobe. Small right pleural effusion is seen. Upper Abdomen: Gallbladder stones are seen. Musculoskeletal: There is decrease in height of the bodies of T4 and T11 vertebrae without demonstrable break in the cortical margins. Findings may suggest old compression fractures. Review of the MIP images confirms the above findings. IMPRESSION: There are intraluminal filling defects in segmental and subsegmental branches in right lower lobe with small thrombus burden. RV LV ratio is 1.2 which may be related to chronic right heart strain. Coronary artery disease. Cardiomegaly. Small right pleural effusion. There are patchy infiltrates in right middle lobe and right lower lobe suggesting atelectasis/pneumonia. There is decrease in height of the bodies of T4 and T11 vertebrae without demonstrable breaking the cortical margins suggesting possible old compression fractures. Gallbladder stones. Electronically Signed   By: Ernie Avena M.D.   On: 11-03-2022 12:15   CT ABDOMEN PELVIS W CONTRAST  Result Date: 11/03/22 CLINICAL DATA:  Abdominal pain EXAM: CT ABDOMEN AND PELVIS WITH CONTRAST TECHNIQUE: Multidetector CT imaging of the abdomen and pelvis was performed using the standard protocol following bolus administration of intravenous contrast. RADIATION DOSE REDUCTION: This exam was performed according to the departmental dose-optimization program which includes automated exposure control, adjustment of the mA and/or kV according to patient size and/or use of iterative reconstruction technique. CONTRAST:  60mL OMNIPAQUE IOHEXOL 350 MG/ML SOLN COMPARISON:  None Available. FINDINGS: Lower chest: Small right pleural effusion is seen. Patchy infiltrates are seen in right lower lung field. Coronary artery calcifications are seen. Hepatobiliary: There are multiple calcified gallbladder stones. There is no  dilation of bile ducts. Pancreas: No focal abnormalities are seen. Spleen: Unremarkable. Adrenals/Urinary Tract: Adrenals are unremarkable. There is no hydronephrosis. There are no renal or ureteral stones. Urinary bladder is unremarkable. Stomach/Bowel: Stomach is not distended. Small bowel loops are not dilated. Appendix is not dilated. There is no significant wall thickening in colon. Scattered diverticula seen in colon without signs of focal diverticulitis. Vascular/Lymphatic: Calcifications are seen in aorta and its major branches. Reproductive: Prostate is enlarged. Other: There is no ascites or pneumoperitoneum. Small paraumbilical hernia containing fat is seen. Musculoskeletal: Degenerative changes are noted with disc space narrowing, bony spurs and encroachment of neural foramina at multiple levels. Degenerative changes are noted in both hips with joint space narrowing, bony spurs and subcortical cysts. Possible old bone infarct is seen in the shaft of left femur. IMPRESSION: There is no evidence of intestinal obstruction or pneumoperitoneum. There is no hydronephrosis. Appendix is unremarkable8. Gallbladder stones. Scattered diverticula are seen in colon without signs of focal diverticulitis. Enlarged prostate. Small right pleural effusion. Patchy infiltrates in the right lower lung field may suggest atelectasis/pneumonia. Coronary artery disease. Arteriosclerosis. Lumbar spondylosis. Marked degenerative changes are noted in both hips. Electronically Signed   By: Ernie Avena M.D.   On: 2022/11/03 11:59  DG Chest 2 View  Result Date: 10/13/2022 CLINICAL DATA:  Shortness of breath. EXAM: CHEST - 2 VIEW COMPARISON:  Chest radiograph 11/08/2021 and earlier FINDINGS: Borderline enlarged cardiac silhouette. Mild interstitial opacities are noted at the right lung base. Left lung is clear. No pleural effusion or pneumothorax. Stable chronic compression fracture in the distal thoracic spine.  IMPRESSION: Mild interstitial opacities at the right lung base may represent subsegmental atelectasis, aspiration changes, or infection in the appropriate clinical context. Electronically Signed   By: Sherron Ales M.D.   On: 10/04/2022 09:57     Assessment and Plan:   Zachary Nolan is a 82 y.o. male with a hx of CAD s/p inferior STEMI w/ DES to RCA '11, ischemic cardiomyopathy, diabetes, hypertension, hyperlipidemia who is being seen 10/16/2022 for the evaluation of atrial fibrillation at the request of Dr. Katrinka Blazing.  New onset atrial fibrillation -- Unclear duration, EKG on admission with atrial fibrillation with RVR, 128 bpm.  He was given 1 bolus of IV Cardizem 20 mg with improvement in rate.  Has been started on IV heparin. -- For now would focus on rate control given his acute illness as well as PE -- IV heparin, with ultimate plan to transition to Eliquis though would be PE dosing -- ad metoprolol 12.5mg  BID  -- Echo pending  Acute PE -- CT angio with intraluminal filling defects in segmental and subsegmental branches of the right lower lobe -- IV heparin -- Management per primary  Acute on chronic HFpEF -- Echo 10/2021 with LVEF of 60 to 65%, grade 1 diastolic dysfunction.  BNP 761, CT chest with small right pleural effusion -- Start IV Lasix 40 mg twice daily -- add metoprolol 25mg  BID and tolerate as tolerated -- echo pending  CAD S/p Inferior STEMI w/ DES to RCA '11 Elevated Troponin (demand?) -- did have known residual disease in the LAD and circumflex of 70% -- In 10/2021 was seen with plans for outpatient cardiac PET though this was never completed -- High-sensitivity troponin 241>> 212, denies any chest pain -- follow up echo -- statin intolerant in the past, check lipids  CAP? -- antibiotics per primary -- lactic acid 1.6, check procal  DM -- Hemoglobin A1c 11.2 10/2021 -- Per primary -- on jardiance as outpatient   OSA -- untreated, unable to tolerate mask in the  past  Risk Assessment/Risk Scores:   New York Heart Association (NYHA) Functional Class NYHA Class III  CHA2DS2-VASc Score = 5   This indicates a 7.2% annual risk of stroke. The patient's score is based upon: CHF History: 1 HTN History: 1 Diabetes History: 1 Stroke History: 0 Vascular Disease History: 0 Age Score: 2 Gender Score: 0    For questions or updates, please contact Zachary Nolan Please consult [email or web address] for contact info under    Signed, Zachary Page, NP  10/01/2022 3:45 PM  Patient seen and examined.  Agree with above documentation.  Zachary Nolan is an 82 year old male with a history of CAD status post STEMI with DES to RCA in 2011, ischemic cardiomyopathy with subsequent recovery of LV systolic function, hypertension, diabetes who we are consulted by Dr. Katrinka Blazing for evaluation of atrial fibrillation.  In 2011 he had inferior STEMI status post DES to RCA, also noted to have 70 to 75% LAD stenosis and 70% mid circumflex stenosis, treated medically.  EF at that time was 35 to 40%.  He was admitted 10/2021 with elevated troponin and shortness of breath.  Echocardiogram showed  EF 60 to 65%, normal RV function.  Plan was for outpatient cardiac PET scan but he did not follow-up.  He presented to ED today with shortness of breath.  Had noted worsening shortness of breath and lower extremity edema for past few weeks but this morning awoke in respiratory distress.  Initial vital signs notable for BP 143/88, pulse 116, SpO2 98% on room air.  Labs showed creatinine 1.6 (stable from prior labs 10/2021, BNP 762, troponin 241 > 212, hemoglobin 11.3, D-dimer 2.44.  Given D-dimer elevation, CTPA was done which showed right lower lobe segmental and subsegmental PE.  Also noted to have patchy infiltrates in right middle lobe and right lower lobe suggesting atelectasis versus pneumonia.  EKG shows atrial fibrillation, rate 128, right axis deviation.  On exam, patient is alert and oriented,  irregular rhythm, tachycardic, no murmurs, diminished breath sounds, 1+ LE edema.  For his atrial fibrillation, he has been started on IV heparin, as well as for treatment for his acute PE.  Will start metoprolol 25 mg twice daily for rate control.  Will check echocardiogram.  For acute on chronic diastolic heart failure, appears volume overloaded on exam.  Will start IV Lasix 40 mg twice daily  Little Ishikawa, MD

## 2022-10-17 NOTE — H&P (Addendum)
History and Physical    Patient: Zachary Nolan ZOX:[id number] DOB: 1940/11/12 DOA: 10/02/2022 DOS: the patient was seen and examined on 10/24/2022 PCP: Linus Galas, NP  Patient coming from: Home  Chief Complaint: Shortness of breath  HPI: Zachary Nolan is a 82 y.o. male with medical history significant of hypertension, hyperlipidemia, CAD s/p DES to RCA in 2011, diabetes mellitus type 2, and BPH who presented with complaints of shortness of breath is worsened over the last 2 weeks.  He makes note that he has had a minimally productive cough with substernal chest discomfort.  He has been unable to move around or do much of anything without being extremely short of breath.  Notes associated symptoms of lower extremity swelling and increased lethargy.   Denies having any fevers, palpitations, nausea, vomiting, abdominal pain, or diarrhea. He had been seen by his primary care provider on 4/18 where symptoms were thought secondary to deconditioning and was has been referred to orthopedics in the outpatient setting.  In the emergency department patient was noted to be afebrile with pulse elevated up to 116, respirations 16-22, and all other vital signs maintained.  Labs significant for WBC 9.5, hemoglobin 11.3, sodium 132 CO2 17, BUN 36, creatinine 1.63, glucose 412, BNP 761.8, high-sensitivity troponin 241.  Chest x-ray noted mild interstitial opacities in the right lung base concerning for atelectasis, aspiration changes, or infection.  Patient has been given bolus of 20 mg of Cardizem IV x 1 dose, heparin Rocephin 1 g IV, and azithromycin IV.  Review of Systems: As mentioned in the history of present illness. All other systems reviewed and are negative. Past Medical History:  Diagnosis Date   CAD (coronary artery disease)    a. LHC (7/11):  Inf STEMI >>> inf AK, EF 35-40%, LAD 70-75%, mid CFX 70%, dist RCA 99% >>> PCI:  3.5 x 28 mm Promus DES to RCA;     Cancer    skin   Depression    Diabetes mellitus     Non-insulin-dependent diabetes mellitus.    DJD (degenerative joint disease)    HTN (hypertension)    Hx of cardiovascular stress test    a. Nuclear (3/14):  Small inf defect - likely scar; no ischemia, EF 59%; LOW RISK;  b. Lexiscan Myoview (1/16): No ischemia, fixed inferior defect consistent with prior infarct versus diaphragmatic attenuation, EF 57%, Low Risk   Hyperlipemia    Ischemic cardiomyopathy    EF 35-40% at time of MI in 2011 >> improved to normal on Nuclear study in 2014   OSA (obstructive sleep apnea) 09/10/2014   severe with AHI 68/hr   RLS (restless legs syndrome)    Seasonal allergies    Past Surgical History:  Procedure Laterality Date   COLONOSCOPY     NOSE SURGERY     Percutaneous coronary intervention using a drug-eluting stent (Promus)     Moderately    severe left anterior descending stenosis.  Moderate left     circumflex stenosis, moderate left ventricular dysfunction with   left  ventricular ejection fraction of 35% to 40%.    Release of left transcarpal ligament.     Release of right transcarpal ligament.     TONSILLECTOMY     Social History:  reports that he quit smoking about 56 years ago. His smoking use included cigarettes. He has never used smokeless tobacco. He reports that he does not drink alcohol and does not use drugs.  Allergies  Allergen Reactions   Dust Mite  Extract     Stuffy head   Pollen Extract     Stuffy head   Shellfish-Derived Products Swelling    JUST CRAB MEAT   Statins Other (See Comments)    Dizziness and myalgias    Family History  Problem Relation Age of Onset   Hypertension Mother    Asthma Mother    Heart attack Father    Hypertension Father    Diabetes Father    Coronary artery disease Unknown    Heart attack Brother    Hypertension Brother    Stroke Neg Hx     Prior to Admission medications   Medication Sig Start Date End Date Taking? Authorizing Provider  acetaminophen (TYLENOL) 650 MG CR tablet Take  650-1,300 mg by mouth every 8 (eight) hours as needed for pain.    [provider]  ALPRAZolam Prudy Feeler) 0.5 MG tablet Take 0.5 mg by mouth at bedtime.    [provider]  colchicine 0.6 MG tablet Take 2 tablets by mouth as needed. 12/03/21   [provider]  ezetimibe (ZETIA) 10 MG tablet Take 10 mg by mouth daily.    [provider]  furosemide (LASIX) 20 MG tablet Take 40 mg by mouth as needed. 01/31/22   [provider]  gabapentin (NEURONTIN) 300 MG capsule Take 300 mg by mouth at bedtime. 10/22/21   [provider]  JARDIANCE 10 MG TABS tablet Take 10 mg by mouth daily. 08/23/21   [provider]  metFORMIN (GLUCOPHAGE) 1000 MG tablet Take 1 tablet by mouth daily at 6 (six) AM.    [provider]  metoprolol tartrate (LOPRESSOR) 25 MG tablet Take 1/2 tablet (12.5 mg total) by mouth 2 (two) times daily. 11/08/21   Amin, Loura Halt, MD  nitroGLYCERIN (NITROSTAT) 0.4 MG SL tablet Place 0.4 mg under the tongue every 5 (five) minutes as needed for chest pain.    [provider]  omeprazole (PRILOSEC) 20 MG capsule Take 20 mg by mouth daily as needed (heartburn/acid reflux).    [provider]  [facility] VERIO test strip as directed. 11/18/21   [provider]  traMADol (ULTRAM) 50 MG tablet Take 50 mg by mouth daily at 6 (six) AM. 01/27/22   [provider]  VENTOLIN HFA 108 (90 Base) MCG/ACT inhaler Inhale 2 puffs into the lungs every 4 (four) hours as needed for wheezing or shortness of breath. 08/27/15   [provider]    Physical Exam: Vitals:   10/25/2022 6045 09/27/2022 0934 10/08/2022 0937 10/22/2022 1045  BP: (!) 143/88  136/78 113/82  Pulse: (!) 116  (!) 59 83  Resp: 17  (!) 22 16  Temp: 97.9 F (36.6 C)  97.8 F (36.6 C)   TempSrc: Oral  Oral   SpO2: 99%  97% 100%  Weight:  88.5 kg    Height:   (1.753 m)     Constitutional: Elderly male who appears to be in some respiratory  discomfort Eyes: PERRL, lids and conjunctivae normal ENMT: Mucous membranes are moist.   Neck: normal, supple, JVD present Respiratory: Decreased overall aeration with upper expiratory wheezes appreciated intermittently.  O2 saturations currently maintained on room air. Cardiovascular: Irregular irregular with murmur present.  At least 2+ pitting bilateral lower extremity edema. 2+ pedal pulses.  Abdomen: no tenderness, no masses palpated. No hepatosplenomegaly. Bowel sounds positive.  Musculoskeletal: no clubbing / cyanosis. No joint deformity upper and lower extremities. Good ROM, no contractures. Normal muscle tone.  Skin: no rashes, lesions, ulcers. No induration Neurologic: CN 2-12 grossly intact.  Able to move all extremities. Psychiatric: Normal judgment and insight. Alert and oriented x 3. Normal mood.   Data Reviewed:  EKG reveals atrial fibrillation 112 bpm.  Reviewed labs imaging, and pertinent records as noted above in HPI.  Assessment and Plan:  New onset atrial fibrillation Patient had been found to be in atrial fibrillation.  No prior history of atrial fibrillation previously reported that in the past.  He has been given bolus of Cardizem 20 mg IV x 1 dose with improvement in heart rates.   -Admit to cardiac telemetry bed -Goal potassium at least 4 and magnesium at least 2 -Check TSH -Check echocardiogram -metoprolol increased to 25 mg twice daily per cardiology -Appreciate cardiology consultative services, will follow-up for further recommendations  Multiple subsegmental pulmonary emboli Acute.  CT angiogram of the chest had noted intraluminal filling defects in the segmental and subsegmental branches of the right lower lobe with a small thrombus burden -Consent was spirometry and flutter valve -Strict bedrest for at least 24 hours -Continue heparin drip per pharmacy -Follow-up echocardiogram  Heart failure with preserved EF Acute on chronic.  Physical exam patient  with at least 2+ pitting bilateral lower extremity edema.  Last echo noted EF to be 60 to 65% with grade 1 diastolic dysfunction back in 2017. -Strict I&Os and daily weight -Check echocardiogram -Lasix 40 mg IV twice daily -Continue metoprolol -Continue to monitor diuresis  Elevated troponin CAD Patient with prior history of STEMI s/p drug-eluting stent to the RCA in 2011.  Labs and high-sensitivity troponins  241->214.  EKG without significant ischemic changes. -Follow-up echocardiogram  Possible community-acquired pneumonia CT angiogram that noted concern for possibility of pneumonia. -Check procalcitonin -Continue Rocephin and azithromycin if deemed medically appropriate  Uncontrolled diabetes mellitus type 2, with without long-term use of insulin Last hemoglobin A1c was 11.2 on 11/06/2021.  Home medication regimen includes metformin 1000 mg daily and Jardiance 10 mg daily. -Hypoglycemic protocols -Check hemoglobin A1c (12.5) -Hold metformin -CBGs before every meal with moderate SSI -Adjust insulin regimen as needed -Diabetic education consulted  Chronic kidney disease stage IIIb Creatinine 1.63 with BUN 36, but appears around patient's baseline. -Continue to monitor kidney function with diuresis  Hyponatremia Acute.  Sodium 132, but thought secondary to patient being fluid overloaded. -Continue to monitor with diuresis  OSA  Patient not on CPAP due to being unable to tolerate   DVT prophylaxis: Heparin Advance Care Planning:   Code Status: Full Code  Consults: Cardiology  Family Communication:Wife update over the phone  Severity of Illness: The appropriate patient status for this patient is INPATIENT. Inpatient status is judged to be reasonable and necessary in order to provide the required intensity of service to ensure the patient's safety. The patient's presenting symptoms, physical exam findings, and initial radiographic and laboratory data in the context of their  chronic comorbidities is felt to place them at high risk for further clinical deterioration. Furthermore, it is not anticipated that the patient will be medically stable for discharge from the hospital within 2 midnights of admission.   * I certify that at the point of admission it is my clinical judgment that the patient will require inpatient hospital care spanning beyond 2 midnights from the point of admission due to high intensity of service, high risk for further deterioration and high frequency of surveillance required.*  Author: Clydie Braun, MD 10/21/2022 12:46 PM  For on call  review [email or web address].

## 2022-10-17 NOTE — ED Notes (Signed)
Alert, NAD, calm, tachypneic, 100% on RA, family at [facility].

## 2022-10-17 NOTE — ED Notes (Signed)
Echo at BS 

## 2022-10-17 NOTE — ED Notes (Signed)
MD at BS

## 2022-10-17 NOTE — ED Notes (Signed)
Spoke with Selena Batten in lab, procalcitonin added on

## 2022-10-17 NOTE — ED Notes (Signed)
Cards NP at [facility]

## 2022-10-17 NOTE — Progress Notes (Addendum)
ANTICOAGULATION CONSULT NOTE  Pharmacy Consult for Heparin Indication: atrial fibrillation  Allergies  Allergen Reactions   Dust Mite Extract     Stuffy head   Pollen Extract     Stuffy head   Shellfish-Derived Products Swelling    JUST CRAB MEAT   Statins Other (See Comments)    Dizziness and myalgias    Patient Measurements: Height:  (175.3 cm) Weight: 88.5 kg (195 lb) IBW/kg (Calculated) : 70.7 Heparin Dosing Weight: 88 kg  Vital Signs: Temp: 97.8 F (36.6 C) (04/22 0937) Temp Source: Oral (04/22 0937) BP: 136/78 (04/22 0937) Pulse Rate: 59 (04/22 0937)  Labs: Recent Labs    09/26/2022 0850  HGB 11.3*  HCT 34.6*  PLT 201  CREATININE 1.63*  TROPONINIHS 241*    Estimated Creatinine Clearance: 37.8 mL/min (A) (by C-G formula based on SCr of 1.63 mg/dL (H)).   Medical History: Past Medical History:  Diagnosis Date   CAD (coronary artery disease)    a. LHC (7/11):  Inf STEMI >>> inf AK, EF 35-40%, LAD 70-75%, mid CFX 70%, dist RCA 99% >>> PCI:  3.5 x 28 mm Promus DES to RCA;     Cancer    skin   Depression    Diabetes mellitus    Non-insulin-dependent diabetes mellitus.    DJD (degenerative joint disease)    HTN (hypertension)    Hx of cardiovascular stress test    a. Nuclear (3/14):  Small inf defect - likely scar; no ischemia, EF 59%; LOW RISK;  b. Lexiscan Myoview (1/16): No ischemia, fixed inferior defect consistent with prior infarct versus diaphragmatic attenuation, EF 57%, Low Risk   Hyperlipemia    Ischemic cardiomyopathy    EF 35-40% at time of MI in 2011 >> improved to normal on Nuclear study in 2014   OSA (obstructive sleep apnea) 09/10/2014   severe with AHI 68/hr   RLS (restless legs syndrome)    Seasonal allergies     Assessment: 64 YOM presents with weakness found to be in atrial fibrillation, not on anticoagulation PTA. Pharmacy consulted to dose heparin.  Goal of Therapy:  Heparin level 0.3-0.7 units/ml Monitor platelets by  anticoagulation protocol: Yes   Plan:  Heparin 4500 units IV once then 1350 units/hr Check heparin level at 2000 Daily CBC and heparin level F/u long-term AC plans  Ellis Savage, PharmD Clinical Pharmacist 10/16/2022,10:46 AM

## 2022-10-17 NOTE — ED Notes (Signed)
Admitting at BS

## 2022-10-17 NOTE — ED Notes (Signed)
Patient transported to X-ray 

## 2022-10-17 NOTE — Progress Notes (Cosign Needed Addendum)
ANTICOAGULATION CONSULT NOTE  Pharmacy Consult for Heparin Indication: atrial fibrillation  Allergies  Allergen Reactions   Dust Mite Extract     Stuffy head   Pollen Extract     Stuffy head   Shellfish-Derived Products Swelling    JUST CRAB MEAT   Statins Other (See Comments)    Dizziness and myalgias    Patient Measurements: Height:  (175.3 cm) Weight: 88.5 kg (195 lb) IBW/kg (Calculated) : 70.7 Heparin Dosing Weight: 88 kg  Vital Signs: Temp: 98.9 F (37.2 C) (04/22 2213) Temp Source: Oral (04/22 2213) BP: 120/77 (04/22 2338) Pulse Rate: 105 (04/22 2338)  Labs: Recent Labs    10/14/2022 0850 10/02/2022 1002 10/04/2022 2315  HGB 11.3*  --   --   HCT 34.6*  --   --   PLT 201  --   --   HEPARINUNFRC  --   --  0.37  CREATININE 1.63*  --   --   TROPONINIHS 241* 212*  --      Estimated Creatinine Clearance: 37.8 mL/min (A) (by C-G formula based on SCr of 1.63 mg/dL (H)).   Medical History: Past Medical History:  Diagnosis Date   CAD (coronary artery disease)    a. LHC (7/11):  Inf STEMI >>> inf AK, EF 35-40%, LAD 70-75%, mid CFX 70%, dist RCA 99% >>> PCI:  3.5 x 28 mm Promus DES to RCA;     Cancer    skin   Depression    Diabetes mellitus    Non-insulin-dependent diabetes mellitus.    DJD (degenerative joint disease)    HTN (hypertension)    Hx of cardiovascular stress test    a. Nuclear (3/14):  Small inf defect - likely scar; no ischemia, EF 59%; LOW RISK;  b. Lexiscan Myoview (1/16): No ischemia, fixed inferior defect consistent with prior infarct versus diaphragmatic attenuation, EF 57%, Low Risk   Hyperlipemia    Ischemic cardiomyopathy    EF 35-40% at time of MI in 2011 >> improved to normal on Nuclear study in 2014   OSA (obstructive sleep apnea) 09/10/2014   severe with AHI 68/hr   RLS (restless legs syndrome)    Seasonal allergies     Assessment: 5 YOM presents with weakness found to be in atrial fibrillation, not on anticoagulation PTA.  Pharmacy consulted to dose heparin.  Goal of Therapy:  Heparin level 0.3-0.7 units/ml Monitor platelets by anticoagulation protocol: Yes   4/22 PM Update HL therapeutic at 0.37 with current heparin rate of 1350units/hr. Hgb 11.3, Plts 201, No noted signs of bleeding.  Plan:  Continue heparin infusion at 1350units/hr Recheck HL with AM labs Continue to monitor for signs/symptoms of bleeding  Loretta Plume, Student Pharmacist 09/27/2022,11:53 PM

## 2022-10-17 NOTE — ED Provider Triage Note (Signed)
Emergency Medicine Provider Triage Evaluation Note  Zachary Nolan , a 82 y.o. male  was evaluated in triage.  Pt complains of SOB/progressive weakness. Did not take his medicines this AM but previously compliant.  Review of Systems  Positive: SOB,fatigue Negative:   Physical Exam  BP (!) 143/88 (BP Location: Right Arm)   Pulse (!) 116   Temp 97.9 F (36.6 C) (Oral)   Resp 17   SpO2 99%  Gen:   Awake, no distress   Resp:  Tachypnea MSK:   Moves extremities without difficulty  Other:    Medical Decision Making  Medically screening exam initiated at 8:34 AM.  Appropriate orders placed.  Zachary Nolan was informed that the remainder of the evaluation will be completed by another provider, this initial triage assessment does not replace that evaluation, and the importance of remaining in the ED until their evaluation is complete.   Glyn Ade, MD 10/22/2022 [phone]

## 2022-10-17 NOTE — ED Provider Notes (Signed)
[name] [facility] DEPARTMENT AT [facility] Provider Note   CSN: [id number] Arrival date & time: 10/12/2022  4098     History  No chief complaint on file.   Zachary Nolan is a 82 y.o. male.  Pt is an 82 yo male with pmhx significant for DM, chronic pain, and bph.  He did see his provider for his sx on 4/18 and was referred to to [facility] for chronic pain.  She did not want to prescribe anything stronger than tramadol as he's had reactions to stronger pain meds.  He did have sob then, but she did not note any red flags.  She thought sob was due to deconditioning.  Pt called EMS this am because he feels more weak, he's sleeping more, and feels sob with exertion.  Pt said he's having a hard time walking.  He does have an electric wheelchair, but it does not fit in the house.  Family said he's not eaten or drank much in the past week.  He's also not been getting out of bed much.  He has been getting out to urinate and then going right back to sleep.       Home Medications Prior to Admission medications   Medication Sig Start Date End Date Taking? Authorizing Provider  acetaminophen (TYLENOL) 650 MG CR tablet Take 650-1,300 mg by mouth every 8 (eight) hours as needed for pain.    [provider]  ALPRAZolam Prudy Feeler) 0.5 MG tablet Take 0.5 mg by mouth at bedtime.    [provider]  colchicine 0.6 MG tablet Take 2 tablets by mouth as needed. 12/03/21   [provider]  ezetimibe (ZETIA) 10 MG tablet Take 10 mg by mouth daily.    [provider]  furosemide (LASIX) 20 MG tablet Take 40 mg by mouth as needed. 01/31/22   [provider]  gabapentin (NEURONTIN) 300 MG capsule Take 300 mg by mouth at bedtime. 10/22/21   [provider]  JARDIANCE 10 MG TABS tablet Take 10 mg by mouth daily. 08/23/21   [provider]  metFORMIN (GLUCOPHAGE) 1000 MG tablet Take 1 tablet by mouth daily at 6 (six) AM.    [provider]   metoprolol tartrate (LOPRESSOR) 25 MG tablet Take 1/2 tablet (12.5 mg total) by mouth 2 (two) times daily. 11/08/21   Amin, Loura Halt, MD  nitroGLYCERIN (NITROSTAT) 0.4 MG SL tablet Place 0.4 mg under the tongue every 5 (five) minutes as needed for chest pain.    [provider]  omeprazole (PRILOSEC) 20 MG capsule Take 20 mg by mouth daily as needed (heartburn/acid reflux).    [provider]  [facility] VERIO test strip as directed. 11/18/21   [provider]  traMADol (ULTRAM) 50 MG tablet Take 50 mg by mouth daily at 6 (six) AM. 01/27/22   [provider]  VENTOLIN HFA 108 (90 Base) MCG/ACT inhaler Inhale 2 puffs into the lungs every 4 (four) hours as needed for wheezing or shortness of breath. 08/27/15   [provider]      Allergies    Dust mite extract, Pollen extract, Shellfish-derived products, and Statins    Review of Systems   Review of Systems  Respiratory:  Positive for shortness of breath.     Physical Exam Updated Vital Signs BP 113/82   Pulse 83   Temp 97.8 F (36.6 C) (Oral)   Resp 16   Ht  (1.753 m)   Wt 88.5  kg   SpO2 100%   BMI 28.80 kg/m  Physical Exam Vitals and nursing note reviewed.  Constitutional:      Appearance: Normal appearance.  HENT:     Head: Normocephalic and atraumatic.     Right Ear: External ear normal.     Left Ear: External ear normal.     Nose: Nose normal.     Mouth/Throat:     Mouth: Mucous membranes are dry.  Eyes:     Extraocular Movements: Extraocular movements intact.     Conjunctiva/sclera: Conjunctivae normal.     Pupils: Pupils are equal, round, and reactive to light.  Cardiovascular:     Rate and Rhythm: Tachycardia present. Rhythm irregular.     Pulses: Normal pulses.     Heart sounds: Normal heart sounds.  Pulmonary:     Effort: Pulmonary effort is normal.     Breath sounds: Normal breath sounds.  Abdominal:     General: Abdomen is flat. Bowel sounds are normal.      Palpations: Abdomen is soft.  Musculoskeletal:     Cervical back: Normal range of motion and neck supple.     Right lower leg: Edema present.     Left lower leg: Edema present.  Skin:    General: Skin is warm.     Capillary Refill: Capillary refill takes less than 2 seconds.  Neurological:     General: No focal deficit present.     Mental Status: He is alert and oriented to person, place, and time.  Psychiatric:        Mood and Affect: Mood normal.        Behavior: Behavior normal.     ED Results / Procedures / Treatments   Labs (all labs ordered are listed, but only abnormal results are displayed) Labs Reviewed  BASIC METABOLIC PANEL - Abnormal; Notable for the following components:      Result Value   Sodium 132 (*)    CO2 17 (*)    Glucose, Bld 412 (*)    BUN 36 (*)    Creatinine, Ser 1.63 (*)    GFR, Estimated 42 (*)    All other components within normal limits  CBC - Abnormal; Notable for the following components:   RBC 3.88 (*)    Hemoglobin 11.3 (*)    HCT 34.6 (*)    All other components within normal limits  URINALYSIS, ROUTINE W REFLEX MICROSCOPIC - Abnormal; Notable for the following components:   APPearance HAZY (*)    Glucose, UA >=500 (*)    Hgb urine dipstick SMALL (*)    Ketones, ur 20 (*)    Protein, ur 100 (*)    All other components within normal limits  BRAIN NATRIURETIC PEPTIDE - Abnormal; Notable for the following components:   B Natriuretic Peptide 761.8 (*)    All other components within normal limits  D-DIMER, QUANTITATIVE (NOT AT [facility]) - Abnormal; Notable for the following components:   D-Dimer, Quant 2.44 (*)    All other components within normal limits  HEPATIC FUNCTION PANEL - Abnormal; Notable for the following components:   AST 14 (*)    All other components within normal limits  CBG MONITORING, ED - Abnormal; Notable for the following components:   Glucose-Capillary 393 (*)    All other components within normal limits  TROPONIN I  (HIGH SENSITIVITY) - Abnormal; Notable for the following components:   Troponin I (High Sensitivity) 241 (*)    All other components  within normal limits  RESP PANEL BY RT-PCR (RSV, FLU A&B, COVID)  RVPGX2  CULTURE, BLOOD (ROUTINE X 2)  CULTURE, BLOOD (ROUTINE X 2)  LIPASE, BLOOD  HEPARIN LEVEL (UNFRACTIONATED)  LACTIC ACID, PLASMA  LACTIC ACID, PLASMA  PROCALCITONIN  TROPONIN I (HIGH SENSITIVITY)    EKG EKG Interpretation  Date/Time:  Monday 2022/11/06 09:55:30 EDT Ventricular Rate:  112 PR Interval:    QRS Duration: 94 QT Interval:  368 QTC Calculation: 503 R Axis:   149 Text Interpretation: Atrial fibrillation Right axis deviation Borderline low voltage, extremity leads Prolonged QT interval +PVCs otherwise, no significant change from EKG done earlier today Confirmed by Jacalyn Lefevre [phone]) on Nov 06, 2022 10:03:34 AM  Radiology CT Angio Chest PE W and/or Wo Contrast  Result Date: 2022-11-06 CLINICAL DATA:  Shortness of breath EXAM: CT ANGIOGRAPHY CHEST WITH CONTRAST TECHNIQUE: Multidetector CT imaging of the chest was performed using the standard protocol during bolus administration of intravenous contrast. Multiplanar CT image reconstructions and MIPs were obtained to evaluate the vascular anatomy. RADIATION DOSE REDUCTION: This exam was performed according to the departmental dose-optimization program which includes automated exposure control, adjustment of the mA and/or kV according to patient size and/or use of iterative reconstruction technique. CONTRAST:  60mL OMNIPAQUE IOHEXOL 350 MG/ML SOLN COMPARISON:  Chest radiographs done earlier today FINDINGS: Cardiovascular: Heart is enlarged in size. Extensive coronary artery calcifications are seen. RV LV ratio is 1.2. Contrast enhancement in the thoracic aorta is less than adequate to evaluate the lumen. There are intraluminal filling defects seen segmental and subsegmental pulmonary artery branches in right lower lobe.  Mediastinum/Nodes: There are subcentimeter nodes in mediastinum and hilar regions. There is prominent pericardial recess. Lungs/Pleura: There are linear patchy infiltrates in right middle lobe and right lower lobe. Small right pleural effusion is seen. Upper Abdomen: Gallbladder stones are seen. Musculoskeletal: There is decrease in height of the bodies of T4 and T11 vertebrae without demonstrable break in the cortical margins. Findings may suggest old compression fractures. Review of the MIP images confirms the above findings. IMPRESSION: There are intraluminal filling defects in segmental and subsegmental branches in right lower lobe with small thrombus burden. RV LV ratio is 1.2 which may be related to chronic right heart strain. Coronary artery disease. Cardiomegaly. Small right pleural effusion. There are patchy infiltrates in right middle lobe and right lower lobe suggesting atelectasis/pneumonia. There is decrease in height of the bodies of T4 and T11 vertebrae without demonstrable breaking the cortical margins suggesting possible old compression fractures. Gallbladder stones. Electronically Signed   By: Ernie Avena M.D.   On: 11/06/22 12:15   CT ABDOMEN PELVIS W CONTRAST  Result Date: 11/06/22 CLINICAL DATA:  Abdominal pain EXAM: CT ABDOMEN AND PELVIS WITH CONTRAST TECHNIQUE: Multidetector CT imaging of the abdomen and pelvis was performed using the standard protocol following bolus administration of intravenous contrast. RADIATION DOSE REDUCTION: This exam was performed according to the departmental dose-optimization program which includes automated exposure control, adjustment of the mA and/or kV according to patient size and/or use of iterative reconstruction technique. CONTRAST:  60mL OMNIPAQUE IOHEXOL 350 MG/ML SOLN COMPARISON:  None Available. FINDINGS: Lower chest: Small right pleural effusion is seen. Patchy infiltrates are seen in right lower lung field. Coronary artery  calcifications are seen. Hepatobiliary: There are multiple calcified gallbladder stones. There is no dilation of bile ducts. Pancreas: No focal abnormalities are seen. Spleen: Unremarkable. Adrenals/Urinary Tract: Adrenals are unremarkable. There is no hydronephrosis. There are no renal or ureteral  stones. Urinary bladder is unremarkable. Stomach/Bowel: Stomach is not distended. Small bowel loops are not dilated. Appendix is not dilated. There is no significant wall thickening in colon. Scattered diverticula seen in colon without signs of focal diverticulitis. Vascular/Lymphatic: Calcifications are seen in aorta and its major branches. Reproductive: Prostate is enlarged. Other: There is no ascites or pneumoperitoneum. Small paraumbilical hernia containing fat is seen. Musculoskeletal: Degenerative changes are noted with disc space narrowing, bony spurs and encroachment of neural foramina at multiple levels. Degenerative changes are noted in both hips with joint space narrowing, bony spurs and subcortical cysts. Possible old bone infarct is seen in the shaft of left femur. IMPRESSION: There is no evidence of intestinal obstruction or pneumoperitoneum. There is no hydronephrosis. Appendix is unremarkable8. Gallbladder stones. Scattered diverticula are seen in colon without signs of focal diverticulitis. Enlarged prostate. Small right pleural effusion. Patchy infiltrates in the right lower lung field may suggest atelectasis/pneumonia. Coronary artery disease. Arteriosclerosis. Lumbar spondylosis. Marked degenerative changes are noted in both hips. Electronically Signed   By: Ernie Avena M.D.   On: 10/11/2022 11:59   DG Chest 2 View  Result Date: 10/12/2022 CLINICAL DATA:  Shortness of breath. EXAM: CHEST - 2 VIEW COMPARISON:  Chest radiograph 11/08/2021 and earlier FINDINGS: Borderline enlarged cardiac silhouette. Mild interstitial opacities are noted at the right lung base. Left lung is clear. No pleural  effusion or pneumothorax. Stable chronic compression fracture in the distal thoracic spine. IMPRESSION: Mild interstitial opacities at the right lung base may represent subsegmental atelectasis, aspiration changes, or infection in the appropriate clinical context. Electronically Signed   By: Sherron Ales M.D.   On: 10/05/2022 09:57    Procedures Procedures    Medications Ordered in ED Medications  heparin ADULT infusion 100 units/mL (25000 units/27mL) (1,350 Units/hr Intravenous New Bag/Given 10/09/2022 1131)  cefTRIAXone (ROCEPHIN) 1 g in sodium chloride 0.9 % 100 mL IVPB (1 g Intravenous New Bag/Given 10/11/2022 1246)  azithromycin (ZITHROMAX) 500 mg in sodium chloride 0.9 % 250 mL IVPB (500 mg Intravenous New Bag/Given 10/23/2022 1245)  diltiazem (CARDIZEM) injection 20 mg (20 mg Intravenous Given 10/03/2022 1018)  heparin bolus via infusion 4,500 Units (4,500 Units Intravenous Bolus from Bag 10/09/2022 1131)  iohexol (OMNIPAQUE) 350 MG/ML injection 60 mL (60 mLs Intravenous Contrast Given 09/30/2022 1124)    ED Course/ Medical Decision Making/ A&P                             Medical Decision Making Amount and/or Complexity of Data Reviewed Labs: ordered. Radiology: ordered.  Risk Prescription drug management. Decision regarding hospitalization.   This patient presents to the ED for concern of sob, this involves an extensive number of treatment options, and is a complaint that carries with it a high risk of complications and morbidity.  The differential diagnosis includes pna, covid/flu/rsv, anemia, cardiac   Co morbidities that complicate the patient evaluation  DM, chronic pain, and bph   Additional history obtained:  Additional history obtained from epic chart review External records from outside source obtained and reviewed including EMS report   Lab Tests:  I Ordered, and personally interpreted labs.  The pertinent results include:  cbc with hgb 11.3 (10.6 on 5/12); bmp with na  132, glucose elevated at 412, bun 36 and cr 1.63 (chronic); bnp elevated at 761 and troponin elevated at 241; covid/flu/rsv neg   Imaging Studies ordered:  I ordered imaging studies including cxr/CT chest/CT  abd/pelvis I independently visualized and interpreted imaging which showed  CXR:  Mild interstitial opacities at the right lung base may represent  subsegmental atelectasis, aspiration changes, or infection in the  appropriate clinical context.  CT abd/pelvis: There is no evidence of intestinal obstruction or pneumoperitoneum.  There is no hydronephrosis. Appendix is unremarkable.   Gallbladder stones. Scattered diverticula are seen in colon without  signs of focal diverticulitis. Enlarged prostate.   Small right pleural effusion. Patchy infiltrates in the right lower  lung field may suggest atelectasis/pneumonia. Coronary artery  disease. Arteriosclerosis. Lumbar spondylosis. Marked degenerative  changes are noted in both hips.  CT chest: There are intraluminal filling defects in segmental and subsegmental  branches in right lower lobe with small thrombus burden. RV LV ratio  is 1.2 which may be related to chronic right heart strain. Coronary  artery disease. Cardiomegaly.    Small right pleural effusion. There are patchy infiltrates in right  middle lobe and right lower lobe suggesting atelectasis/pneumonia.    There is decrease in height of the bodies of T4 and T11 vertebrae  without demonstrable breaking the cortical margins suggesting  possible old compression fractures. Gallbladder stones.   I agree with the radiologist interpretation   Cardiac Monitoring:  The patient was maintained on a cardiac monitor.  I personally viewed and interpreted the cardiac monitored which showed an underlying rhythm of: afib with rvr   Medicines ordered and prescription drug management:  I ordered medication including cardizem  for afib  Reevaluation of the patient after these  medicines showed that the patient improved I have reviewed the patients home medicines and have made adjustments as needed   Test Considered:  ct   Critical Interventions:  Heparin/abx   Consultations Obtained:  I requested consultation with the cardiologist,  and discussed lab and imaging findings as well as pertinent plan - they will see pt in consult Pt d/w Dr. Arlyss Queen ([facility]) for admission   Problem List / ED Course:  Afib with rvr:  rate controlled with 20 mg cardizem bolus.  He is not requiring a drip.  Heparin started. PE with chronic heart strain and elevated troponin:  cards consulted.  Heparin started RML and RLL CAP:  pt started on rocephin/zithromax Cholelithiasis:  no evidence of cholecystitis.  Could be contributing to nausea.     Reevaluation:  After the interventions noted above, I reevaluated the patient and found that they have :improved   Social Determinants of Health:  Lives at home   Dispostion:  After consideration of the diagnostic results and the patients response to treatment, I feel that the patent would benefit from admission.    CRITICAL CARE Performed by: Jacalyn Lefevre   Total critical care time: 30 minutes  Critical care time was exclusive of separately billable procedures and treating other patients.  Critical care was necessary to treat or prevent imminent or life-threatening deterioration.  Critical care was time spent personally by me on the following activities: development of treatment plan with patient and/or surrogate as well as nursing, discussions with consultants, evaluation of patient's response to treatment, examination of patient, obtaining history from patient or surrogate, ordering and performing treatments and interventions, ordering and review of laboratory studies, ordering and review of radiographic studies, pulse oximetry and re-evaluation of patient's condition.         Final Clinical Impression(s) / ED  Diagnoses Final diagnoses:  Atrial fibrillation with rapid ventricular response  Multiple subsegmental pulmonary emboli without acute cor pulmonale  Calculus of gallbladder without cholecystitis without obstruction  Community acquired pneumonia of right lower lobe of lung  Elevated troponin    Rx / DC Orders ED Discharge Orders     None         Jacalyn Lefevre, MD Nov 13, 2022 1250

## 2022-10-17 NOTE — Progress Notes (Signed)
Echocardiogram 2D Echocardiogram has been performed.  Toni Amend 10/16/2022, 4:04 PM

## 2022-10-17 NOTE — ED Notes (Signed)
D-Dimer recollected per main lab request

## 2022-10-17 NOTE — ED Notes (Signed)
Patient transported to CT 

## 2022-10-18 ENCOUNTER — Inpatient Hospital Stay (HOSPITAL_COMMUNITY): Payer: Medicare HMO

## 2022-10-18 ENCOUNTER — Other Ambulatory Visit: Payer: Self-pay

## 2022-10-18 ENCOUNTER — Other Ambulatory Visit (HOSPITAL_COMMUNITY): Payer: Self-pay

## 2022-10-18 DIAGNOSIS — I5043 Acute on chronic combined systolic (congestive) and diastolic (congestive) heart failure: Secondary | ICD-10-CM | POA: Diagnosis not present

## 2022-10-18 DIAGNOSIS — Z86711 Personal history of pulmonary embolism: Secondary | ICD-10-CM

## 2022-10-18 DIAGNOSIS — I4891 Unspecified atrial fibrillation: Secondary | ICD-10-CM | POA: Diagnosis not present

## 2022-10-18 DIAGNOSIS — I2694 Multiple subsegmental pulmonary emboli without acute cor pulmonale: Secondary | ICD-10-CM | POA: Diagnosis not present

## 2022-10-18 LAB — ECHOCARDIOGRAM LIMITED
AR max vel: 1.3 cm2
AV Area VTI: 1.38 cm2
AV Area mean vel: 1.31 cm2
AV Mean grad: 16 mmHg
AV Peak grad: 21.9 mmHg
Ao pk vel: 2.34 m/s
Est EF: 25

## 2022-10-18 LAB — CBC
HCT: 33.7 % — ABNORMAL LOW (ref 39.0–52.0)
Hemoglobin: 10.9 g/dL — ABNORMAL LOW (ref 13.0–17.0)
MCH: 28.6 pg (ref 26.0–34.0)
MCHC: 32.3 g/dL (ref 30.0–36.0)
MCV: 88.5 fL (ref 80.0–100.0)
Platelets: 226 10*3/uL (ref 150–400)
RBC: 3.81 MIL/uL — ABNORMAL LOW (ref 4.22–5.81)
RDW: 13.3 % (ref 11.5–15.5)
WBC: 7.6 10*3/uL (ref 4.0–10.5)
nRBC: 0 % (ref 0.0–0.2)

## 2022-10-18 LAB — BASIC METABOLIC PANEL
Anion gap: 13 (ref 5–15)
BUN: 37 mg/dL — ABNORMAL HIGH (ref 8–23)
CO2: 19 mmol/L — ABNORMAL LOW (ref 22–32)
Calcium: 9.3 mg/dL (ref 8.9–10.3)
Chloride: 103 mmol/L (ref 98–111)
Creatinine, Ser: 1.77 mg/dL — ABNORMAL HIGH (ref 0.61–1.24)
GFR, Estimated: 38 mL/min — ABNORMAL LOW (ref >60)
Glucose, Bld: 184 mg/dL — ABNORMAL HIGH (ref 70–99)
Potassium: 3.8 mmol/L (ref 3.5–5.1)
Sodium: 135 mmol/L (ref 135–145)

## 2022-10-18 LAB — LIPID PANEL
Cholesterol: 208 mg/dL — ABNORMAL HIGH (ref 0–200)
HDL: 31 mg/dL — ABNORMAL LOW (ref >40)
LDL Cholesterol: 162 mg/dL — ABNORMAL HIGH (ref 0–99)
Total CHOL/HDL Ratio: 6.7 RATIO
Triglycerides: 74 mg/dL (ref <150)
VLDL: 15 mg/dL (ref 0–40)

## 2022-10-18 LAB — HEPARIN LEVEL (UNFRACTIONATED)
Heparin Unfractionated: 0.22 IU/mL — ABNORMAL LOW (ref 0.30–0.70)
Heparin Unfractionated: 0.45 [IU]/mL (ref 0.30–0.70)

## 2022-10-18 LAB — MRSA NEXT GEN BY PCR, NASAL: MRSA by PCR Next Gen: NOT DETECTED

## 2022-10-18 LAB — GLUCOSE, CAPILLARY
Glucose-Capillary: 204 mg/dL — ABNORMAL HIGH (ref 70–99)
Glucose-Capillary: 186 mg/dL — ABNORMAL HIGH (ref 70–99)
Glucose-Capillary: 219 mg/dL — ABNORMAL HIGH (ref 70–99)

## 2022-10-18 LAB — TSH: TSH: 0.733 u[IU]/mL (ref 0.350–4.500)

## 2022-10-18 LAB — COOXEMETRY PANEL
Carboxyhemoglobin: 1.6 % — ABNORMAL HIGH (ref 0.5–1.5)
Methemoglobin: <0.7 % (ref 0.0–1.5)
O2 Saturation: 50.6 %
Total hemoglobin: 10.3 g/dL — ABNORMAL LOW (ref 12.0–16.0)

## 2022-10-18 LAB — MAGNESIUM: Magnesium: 2 mg/dL (ref 1.7–2.4)

## 2022-10-18 MED ORDER — AMIODARONE HCL IN DEXTROSE 360-4.14 MG/200ML-% IV SOLN
60.0000 mg/h | INTRAVENOUS | Status: DC
  Administered 2022-10-18: 30 mg/h via INTRAVENOUS
  Administered 2022-10-19 – 2022-10-20 (×3): 60 mg/h via INTRAVENOUS
  Filled 2022-10-18 (×4): qty 200

## 2022-10-18 MED ORDER — FUROSEMIDE 10 MG/ML IJ SOLN
80.0000 mg | Freq: Two times a day (BID) | INTRAMUSCULAR | Status: DC
  Administered 2022-10-18 – 2022-10-19 (×2): 80 mg via INTRAVENOUS
  Filled 2022-10-18 (×2): qty 8

## 2022-10-18 MED ORDER — HYDRALAZINE HCL 20 MG/ML IJ SOLN: 10.0000 mg | INTRAMUSCULAR | Status: DC | PRN

## 2022-10-18 MED ORDER — SODIUM CHLORIDE 0.9% FLUSH: 10.0000 mL | Freq: Two times a day (BID) | INTRAVENOUS | Status: DC

## 2022-10-18 MED ORDER — ONDANSETRON HCL 4 MG/2ML IJ SOLN: 4.0000 mg | Freq: Four times a day (QID) | INTRAMUSCULAR | Status: DC | PRN

## 2022-10-18 MED ORDER — CHLORHEXIDINE GLUCONATE CLOTH 2 % EX PADS
6.0000 | MEDICATED_PAD | Freq: Every day | CUTANEOUS | Status: DC
  Administered 2022-10-18 – 2022-10-20 (×3): 6 via TOPICAL

## 2022-10-18 MED ORDER — INSULIN GLARGINE-YFGN 100 UNIT/ML ~~LOC~~ SOLN
10.0000 [IU] | Freq: Every day | SUBCUTANEOUS | Status: DC
  Administered 2022-10-18: 10 [IU] via SUBCUTANEOUS
  Filled 2022-10-18 (×2): qty 0.1

## 2022-10-18 MED ORDER — AMIODARONE HCL IN DEXTROSE 360-4.14 MG/200ML-% IV SOLN
60.0000 mg/h | INTRAVENOUS | Status: AC
  Administered 2022-10-18: 60 mg/h via INTRAVENOUS
  Filled 2022-10-18: qty 200

## 2022-10-18 MED ORDER — PNEUMOCOCCAL 20-VAL CONJ VACC 0.5 ML IM SUSY
0.5000 mL | PREFILLED_SYRINGE | INTRAMUSCULAR | Status: DC
  Filled 2022-10-18: qty 0.5

## 2022-10-18 MED ORDER — LEVALBUTEROL HCL 1.25 MG/0.5ML IN NEBU: 1.2500 mg | INHALATION_SOLUTION | Freq: Three times a day (TID) | RESPIRATORY_TRACT | Status: DC | PRN

## 2022-10-18 MED ORDER — MILRINONE LACTATE IN DEXTROSE 20-5 MG/100ML-% IV SOLN
0.1250 ug/kg/min | INTRAVENOUS | Status: DC
  Administered 2022-10-18 – 2022-10-19 (×2): 0.25 ug/kg/min via INTRAVENOUS
  Filled 2022-10-18 (×3): qty 100

## 2022-10-18 MED ORDER — LEVALBUTEROL HCL 1.25 MG/0.5ML IN NEBU: 1.2500 mg | INHALATION_SOLUTION | Freq: Three times a day (TID) | RESPIRATORY_TRACT | Status: DC

## 2022-10-18 MED ORDER — SODIUM CHLORIDE 0.9% FLUSH: 10.0000 mL | INTRAVENOUS | Status: DC | PRN

## 2022-10-18 MED ORDER — EZETIMIBE 10 MG PO TABS
10.0000 mg | ORAL_TABLET | Freq: Every day | ORAL | Status: DC
  Administered 2022-10-18: 10 mg via ORAL
  Filled 2022-10-18: qty 1

## 2022-10-18 MED ORDER — HEPARIN BOLUS VIA INFUSION
1300.0000 [IU] | Freq: Once | INTRAVENOUS | Status: AC
  Administered 2022-10-18: 1300 [IU] via INTRAVENOUS
  Filled 2022-10-18: qty 1300

## 2022-10-18 MED ORDER — POTASSIUM CHLORIDE CRYS ER 20 MEQ PO TBCR
30.0000 meq | EXTENDED_RELEASE_TABLET | Freq: Once | ORAL | Status: AC
  Administered 2022-10-18: 30 meq via ORAL
  Filled 2022-10-18: qty 1

## 2022-10-18 MED ORDER — MELATONIN 5 MG PO TABS
5.0000 mg | ORAL_TABLET | Freq: Every evening | ORAL | Status: DC | PRN
  Administered 2022-10-18: 5 mg via ORAL
  Filled 2022-10-18: qty 1

## 2022-10-18 MED ORDER — OLANZAPINE 10 MG IM SOLR
5.0000 mg | Freq: Four times a day (QID) | INTRAMUSCULAR | Status: DC | PRN
  Administered 2022-10-19: 5 mg via INTRAMUSCULAR
  Filled 2022-10-18 (×2): qty 10

## 2022-10-18 MED ORDER — SENNOSIDES-DOCUSATE SODIUM 8.6-50 MG PO TABS: 1.0000 | ORAL_TABLET | Freq: Every evening | ORAL | Status: DC | PRN

## 2022-10-18 MED ORDER — GUAIFENESIN 100 MG/5ML PO LIQD: 5.0000 mL | ORAL | Status: DC | PRN

## 2022-10-18 MED ORDER — METOPROLOL TARTRATE 5 MG/5ML IV SOLN: 5.0000 mg | INTRAVENOUS | Status: DC | PRN

## 2022-10-18 NOTE — Progress Notes (Addendum)
Rounding Note    Patient Name: Zachary Nolan Date of Encounter: 10/18/2022  [facility] Cardiologist: Donato Schultz, MD   Subjective   Worsening renal function (1.63>1.77).  I/Os not recorded.  BP 118/82.  Reports dyspnea  Inpatient Medications    Scheduled Meds:  ALPRAZolam  0.5 mg Oral QHS   furosemide  40 mg Intravenous BID   gabapentin  300 mg Oral QHS   insulin aspart  0-15 Units Subcutaneous TID WC   insulin aspart  0-5 Units Subcutaneous QHS   metoprolol tartrate  25 mg Oral BID   [START ON 10/19/2022] pneumococcal 20-valent conjugate vaccine  0.5 mL Intramuscular Tomorrow-1000   sodium chloride flush  3 mL Intravenous Q12H   Continuous Infusions:  heparin 1,350 Units/hr (10/18/22 0241)   PRN Meds: acetaminophen **OR** acetaminophen, guaiFENesin, hydrALAZINE, levalbuterol, melatonin, metoprolol tartrate, OLANZapine, ondansetron (ZOFRAN) IV, senna-docusate   Vital Signs    Vitals:   10/18/22 0045 10/18/22 0215 10/18/22 0300 10/18/22 0818  BP: (!) 120/100 118/87  118/82  Pulse: 99 (!) 105 (!) 105 100  Resp: (!) 29 (!) 21  20  Temp: 99.1 F (37.3 C) 97.6 F (36.4 C) 97.6 F (36.4 C) 97.8 F (36.6 C)  TempSrc: Oral Oral Oral Oral  SpO2: 96% 100% 94% 95%  Weight:  90 kg    Height:        Intake/Output Summary (Last 24 hours) at 10/18/2022 0931 Last data filed at 10/18/2022 0324 Gross per 24 hour  Intake 531.78 ml  Output 0 ml  Net 531.78 ml      10/18/2022    2:15 AM 10/15/2022    9:34 AM 02/15/2022    8:38 AM  Last 3 Weights  Weight (lbs) 198 lb 6.6 oz 195 lb 199 lb 9.6 oz  Weight (kg) 90 kg 88.451 kg 90.538 kg      Telemetry    Afib rate 90-100s - Personally Reviewed  ECG    No new ECG - Personally Reviewed  Physical Exam   GEN: increased WOB Neck: + JVD Cardiac:tachycardic, irregular, 2/6 systolic murmur Respiratory: expiratory wheezing GI: Soft, nontende MS: trace edema Neuro:  Nonfocal  Psych: Normal affect   Labs    High  Sensitivity Troponin:   Recent Labs  Lab 10/10/2022 0850 10/08/2022 1002  TROPONINIHS 241* 212*     Chemistry Recent Labs  Lab 10/13/2022 0850 10/24/2022 1013 10/18/22 0642  NA 132*  --  135  K 4.6  --  3.8  CL 101  --  103  CO2 17*  --  19*  GLUCOSE 412*  --  184*  BUN 36*  --  37*  CREATININE 1.63*  --  1.77*  CALCIUM 9.7  --  9.3  MG  --   --  2.0  PROT  --  7.1  --   ALBUMIN  --  3.5  --   AST  --  14*  --   ALT  --  11  --   ALKPHOS  --  79  --   BILITOT  --  1.0  --   GFRNONAA 42*  --  38*  ANIONGAP 14  --  13    Lipids  Recent Labs  Lab 10/18/22 0642  CHOL 208*  TRIG 74  HDL 31*  LDLCALC 162*  CHOLHDL 6.7    Hematology Recent Labs  Lab 10/09/2022 0850 10/18/22 0642  WBC 9.5 7.6  RBC 3.88* 3.81*  HGB 11.3* 10.9*  HCT  34.6* 33.7*  MCV 89.2 88.5  MCH 29.1 28.6  MCHC 32.7 32.3  RDW 13.2 13.3  PLT 201 226   Thyroid  Recent Labs  Lab 10/18/22 0642  TSH 0.733    BNP Recent Labs  Lab 10/22/2022 0850  BNP 761.8*    DDimer  Recent Labs  Lab 10/22/2022 1002  DDIMER 2.44*     Radiology    ECHOCARDIOGRAM COMPLETE  Result Date: 10/09/2022    ECHOCARDIOGRAM REPORT   Patient Name:   [name] Date of Exam: 10/16/2022 Medical Rec #:  [id number]  Height:       69.0 in Accession #:    [id number] Weight:       195.0 lb Date of Birth:  09-16-1940  BSA:          2.044 m Patient Age:    82 years   BP:           128/95 mmHg Patient Gender: M          HR:           97 bpm. Exam Location:  Inpatient Procedure: 2D Echo, Cardiac Doppler and Color Doppler Indications:     I48.91* Unspeicified atrial fibrillation  History:         Patient has prior history of Echocardiogram examinations. CAD;                  Risk Factors:Hypertension, Diabetes and Dyslipidemia.  Sonographer:     Mike Gip Referring Phys:  Clydie Braun Diagnosing Phys: Arvilla Meres MD IMPRESSIONS  1. Left ventricular ejection fraction, by estimation, is 20 to 25%. The left ventricle has severely  decreased function. The left ventricle demonstrates global hypokinesis. There is moderate concentric left ventricular hypertrophy. Left ventricular diastolic function could not be evaluated.  2. Right ventricular systolic function is severely reduced. The right ventricular size is normal. There is moderately elevated pulmonary artery systolic pressure.  3. Left atrial size was moderately dilated.  4. Right atrial size was moderately dilated.  5. The mitral valve is normal in structure. Severe mitral valve regurgitation. No evidence of mitral stenosis.  6. The aortic valve is moderately calcified and the RCC is fixed. The transvalvualr gradient is not elevated by Doppler but given severe LV dysfunction cannot esclude low gradient severe AS. Will repeat limited study with further interrogation of the AoV. The aortic valve is tricuspid. There is moderate calcification of the aortic valve. Aortic valve regurgitation is not visualized. Moderate to severe aortic valve stenosis.  7. The inferior vena cava is dilated in size with <50% respiratory variability, suggesting right atrial pressure of 15 mmHg.  8. EF has markedly decreased since previosu echo. FINDINGS  Left Ventricle: Left ventricular ejection fraction, by estimation, is 20 to 25%. The left ventricle has severely decreased function. The left ventricle demonstrates global hypokinesis. The left ventricular internal cavity size was normal in size. There is moderate concentric left ventricular hypertrophy. Left ventricular diastolic function could not be evaluated due to atrial fibrillation. Left ventricular diastolic function could not be evaluated. Right Ventricle: The right ventricular size is normal. No increase in right ventricular wall thickness. Right ventricular systolic function is severely reduced. There is moderately elevated pulmonary artery systolic pressure. The tricuspid regurgitant velocity is 2.92 m/s, and with an assumed right atrial pressure of 15  mmHg, the estimated right ventricular systolic pressure is 49.1 mmHg. Left Atrium: Left atrial size was moderately dilated. Right Atrium: Right atrial size was moderately  dilated. Pericardium: There is no evidence of pericardial effusion. Mitral Valve: The mitral valve is normal in structure. Severe mitral valve regurgitation, with centrally-directed jet. No evidence of mitral valve stenosis. Tricuspid Valve: The tricuspid valve is normal in structure. Tricuspid valve regurgitation is mild . No evidence of tricuspid stenosis. Aortic Valve: The aortic valve is moderately calcified and the RCC is fixed. The transvalvualr gradient is not elevated by Doppler but given severe LV dysfunction cannot esclude low gradient severe AS. Will repeat limited study with further interrogation  of the AoV. The aortic valve is tricuspid. There is moderate calcification of the aortic valve. Aortic valve regurgitation is not visualized. Moderate to severe aortic stenosis is present. Pulmonic Valve: The pulmonic valve was normal in structure. Pulmonic valve regurgitation is mild. No evidence of pulmonic stenosis. Aorta: The aortic root is normal in size and structure. Venous: The inferior vena cava is dilated in size with less than 50% respiratory variability, suggesting right atrial pressure of 15 mmHg. IAS/Shunts: No atrial level shunt detected by color flow Doppler.  LEFT VENTRICLE PLAX 2D LVIDd:         5.20 cm      Diastology LVIDs:         4.60 cm      LV e' medial:    6.42 cm/s LV PW:         1.40 cm      LV E/e' medial:  16.0 LV IVS:        1.50 cm      LV e' lateral:   9.46 cm/s LVOT diam:     2.20 cm      LV E/e' lateral: 10.9 LV SV:         38 LV SV Index:   19 LVOT Area:     3.80 cm  LV Volumes (MOD) LV vol d, MOD A2C: 164.0 ml LV vol d, MOD A4C: 142.0 ml LV vol s, MOD A2C: 116.0 ml LV vol s, MOD A4C: 112.0 ml LV SV MOD A2C:     48.0 ml LV SV MOD A4C:     142.0 ml LV SV MOD BP:      41.1 ml RIGHT VENTRICLE            IVC RV  Basal diam:  3.80 cm    IVC diam: 2.50 cm RV S prime:     9.79 cm/s TAPSE (M-mode): 1.4 cm LEFT ATRIUM              Index        RIGHT ATRIUM           Index LA diam:        4.80 cm  2.35 cm/m   RA Area:     22.80 cm LA Vol (A2C):   112.0 ml 54.80 ml/m  RA Volume:   65.20 ml  31.90 ml/m LA Vol (A4C):   95.9 ml  46.92 ml/m LA Biplane Vol: 104.0 ml 50.88 ml/m  AORTIC VALVE LVOT Vmax:   56.30 cm/s LVOT Vmean:  42.500 cm/s LVOT VTI:    0.100 m  AORTA Ao Root diam: 3.40 cm Ao Asc diam:  3.70 cm MITRAL VALVE                  TRICUSPID VALVE MV Area (PHT): 4.74 cm       TR Peak grad:   34.1 mmHg MV Decel Time: 160 msec       TR Vmax:  292.00 cm/s MR Peak grad:    90.6 mmHg MR Mean grad:    57.0 mmHg    SHUNTS MR Vmax:         476.00 cm/s  Systemic VTI:  0.10 m MR Vmean:        346.0 cm/s   Systemic Diam: 2.20 cm MR PISA:         1.57 cm MR PISA Eff ROA: 11 mm MR PISA Radius:  0.50 cm MV E velocity: 103.00 cm/s Arvilla Meres MD Electronically signed by Arvilla Meres MD Signature Date/Time: 11-04-22/4:16:02 PM    Final (Updated)    ECHOCARDIOGRAM LIMITED  Result Date: Nov 04, 2022    ECHOCARDIOGRAM LIMITED REPORT   Patient Name:   AARYAV HOPFENSPERGER Date of Exam: 11-04-22 Medical Rec #:  [id number]  Height:       69.0 in Accession #:    [id number] Weight:       195.0 lb Date of Birth:  1940/12/20  BSA:          2.044 m Patient Age:    82 years   BP:           143/92 mmHg Patient Gender: M          HR:           85 bpm. Exam Location:  Inpatient Procedure: 2D Echo, Cardiac Doppler and Color Doppler Indications:    Atrial fibrillation  History:        Patient has prior history of Echocardiogram examinations.  Sonographer:    NA Referring Phys: [id number] DANIEL R BENSIMHON IMPRESSIONS  1. Left ventricular ejection fraction, by estimation, is 25 to 30%. The left ventricle has severely decreased function. The left ventricle demonstrates global hypokinesis. Left ventricular diastolic parameters are indeterminate.  2.  Right ventricular systolic function is mildly reduced. The right ventricular size is normal.  3. The mitral valve is normal in structure. Trivial mitral valve regurgitation. No evidence of mitral stenosis.  4. The aortic valve is tricuspid. There is severe calcifcation of the aortic valve. Aortic valve regurgitation is not visualized. Low flow/low gradient moderate aortic valve stenosis. Aortic valve area, by VTI measures 1.38 cm. Aortic valve mean gradient measures 16.0 mmHg.  5. Aortic dilatation noted. There is mild dilatation of the ascending aorta, measuring 38 mm.  6. IVC not visualized.  7. The patient is in atrial fibrillation.  8. Limited echo FINDINGS  Left Ventricle: Left ventricular ejection fraction, by estimation, is 25 to 30%. The left ventricle has severely decreased function. The left ventricle demonstrates global hypokinesis. Left ventricular diastolic parameters are indeterminate. Right Ventricle: The right ventricular size is normal. Right ventricular systolic function is mildly reduced. Pericardium: Trivial pericardial effusion is present. Mitral Valve: The mitral valve is normal in structure. Trivial mitral valve regurgitation. No evidence of mitral valve stenosis. Tricuspid Valve: The tricuspid valve is normal in structure. Tricuspid valve regurgitation is not demonstrated. Aortic Valve: The aortic valve is tricuspid. There is severe calcifcation of the aortic valve. Aortic valve regurgitation is not visualized. Moderate aortic stenosis is present. Aortic valve mean gradient measures 16.0 mmHg. Aortic valve peak gradient measures 21.9 mmHg. Aortic valve area, by VTI measures 1.38 cm. Pulmonic Valve: The pulmonic valve was normal in structure. Pulmonic valve regurgitation is trivial. Aorta: Aortic dilatation noted. There is mild dilatation of the ascending aorta, measuring 38 mm. LEFT VENTRICLE PLAX 2D LVOT diam:     2.30 cm LV SV:  52 LV SV Index:   25 LVOT Area:     4.15 cm  AORTIC  VALVE AV Area (Vmax):    1.30 cm AV Area (Vmean):   1.31 cm AV Area (VTI):     1.38 cm AV Vmax:           234.00 cm/s AV Vmean:          180.667 cm/s AV VTI:            0.376 m AV Peak Grad:      21.9 mmHg AV Mean Grad:      16.0 mmHg LVOT Vmax:         73.45 cm/s LVOT Vmean:        56.750 cm/s LVOT VTI:          0.125 m LVOT/AV VTI ratio: 0.33  SHUNTS Systemic VTI:  0.12 m Systemic Diam: 2.30 cm Dalton McleanMD Electronically signed by Wilfred Lacy Signature Date/Time: Oct 31, 2022/5:22:23 PM    Final    CT Angio Chest PE W and/or Wo Contrast  Result Date: 2022-10-31 CLINICAL DATA:  Shortness of breath EXAM: CT ANGIOGRAPHY CHEST WITH CONTRAST TECHNIQUE: Multidetector CT imaging of the chest was performed using the standard protocol during bolus administration of intravenous contrast. Multiplanar CT image reconstructions and MIPs were obtained to evaluate the vascular anatomy. RADIATION DOSE REDUCTION: This exam was performed according to the departmental dose-optimization program which includes automated exposure control, adjustment of the mA and/or kV according to patient size and/or use of iterative reconstruction technique. CONTRAST:  60mL OMNIPAQUE IOHEXOL 350 MG/ML SOLN COMPARISON:  Chest radiographs done earlier today FINDINGS: Cardiovascular: Heart is enlarged in size. Extensive coronary artery calcifications are seen. RV LV ratio is 1.2. Contrast enhancement in the thoracic aorta is less than adequate to evaluate the lumen. There are intraluminal filling defects seen segmental and subsegmental pulmonary artery branches in right lower lobe. Mediastinum/Nodes: There are subcentimeter nodes in mediastinum and hilar regions. There is prominent pericardial recess. Lungs/Pleura: There are linear patchy infiltrates in right middle lobe and right lower lobe. Small right pleural effusion is seen. Upper Abdomen: Gallbladder stones are seen. Musculoskeletal: There is decrease in height of the bodies of T4 and  T11 vertebrae without demonstrable break in the cortical margins. Findings may suggest old compression fractures. Review of the MIP images confirms the above findings. IMPRESSION: There are intraluminal filling defects in segmental and subsegmental branches in right lower lobe with small thrombus burden. RV LV ratio is 1.2 which may be related to chronic right heart strain. Coronary artery disease. Cardiomegaly. Small right pleural effusion. There are patchy infiltrates in right middle lobe and right lower lobe suggesting atelectasis/pneumonia. There is decrease in height of the bodies of T4 and T11 vertebrae without demonstrable breaking the cortical margins suggesting possible old compression fractures. Gallbladder stones. Electronically Signed   By: Ernie Avena M.D.   On: 2022/10/31 12:15   CT ABDOMEN PELVIS W CONTRAST  Result Date: 2022-10-31 CLINICAL DATA:  Abdominal pain EXAM: CT ABDOMEN AND PELVIS WITH CONTRAST TECHNIQUE: Multidetector CT imaging of the abdomen and pelvis was performed using the standard protocol following bolus administration of intravenous contrast. RADIATION DOSE REDUCTION: This exam was performed according to the departmental dose-optimization program which includes automated exposure control, adjustment of the mA and/or kV according to patient size and/or use of iterative reconstruction technique. CONTRAST:  60mL OMNIPAQUE IOHEXOL 350 MG/ML SOLN COMPARISON:  None Available. FINDINGS: Lower chest: Small right pleural effusion is seen. Patchy  infiltrates are seen in right lower lung field. Coronary artery calcifications are seen. Hepatobiliary: There are multiple calcified gallbladder stones. There is no dilation of bile ducts. Pancreas: No focal abnormalities are seen. Spleen: Unremarkable. Adrenals/Urinary Tract: Adrenals are unremarkable. There is no hydronephrosis. There are no renal or ureteral stones. Urinary bladder is unremarkable. Stomach/Bowel: Stomach is not  distended. Small bowel loops are not dilated. Appendix is not dilated. There is no significant wall thickening in colon. Scattered diverticula seen in colon without signs of focal diverticulitis. Vascular/Lymphatic: Calcifications are seen in aorta and its major branches. Reproductive: Prostate is enlarged. Other: There is no ascites or pneumoperitoneum. Small paraumbilical hernia containing fat is seen. Musculoskeletal: Degenerative changes are noted with disc space narrowing, bony spurs and encroachment of neural foramina at multiple levels. Degenerative changes are noted in both hips with joint space narrowing, bony spurs and subcortical cysts. Possible old bone infarct is seen in the shaft of left femur. IMPRESSION: There is no evidence of intestinal obstruction or pneumoperitoneum. There is no hydronephrosis. Appendix is unremarkable8. Gallbladder stones. Scattered diverticula are seen in colon without signs of focal diverticulitis. Enlarged prostate. Small right pleural effusion. Patchy infiltrates in the right lower lung field may suggest atelectasis/pneumonia. Coronary artery disease. Arteriosclerosis. Lumbar spondylosis. Marked degenerative changes are noted in both hips. Electronically Signed   By: Ernie Avena M.D.   On: 11-05-2022 11:59   DG Chest 2 View  Result Date: 05-Nov-2022 CLINICAL DATA:  Shortness of breath. EXAM: CHEST - 2 VIEW COMPARISON:  Chest radiograph 11/08/2021 and earlier FINDINGS: Borderline enlarged cardiac silhouette. Mild interstitial opacities are noted at the right lung base. Left lung is clear. No pleural effusion or pneumothorax. Stable chronic compression fracture in the distal thoracic spine. IMPRESSION: Mild interstitial opacities at the right lung base may represent subsegmental atelectasis, aspiration changes, or infection in the appropriate clinical context. Electronically Signed   By: Sherron Ales M.D.   On: 05-Nov-2022 09:57    Cardiac Studies     Patient  Profile     82 y.o. male with a hx of CAD s/p inferior STEMI w/ DES to RCA '11, ischemic cardiomyopathy, diabetes, hypertension, hyperlipidemia who is being seen  for the evaluation of atrial fibrillation   Assessment & Plan    Acute on chronic combined heart failure: TTE showed EF 25-30%, mild RV dysfunction, moderate AS. Echo 10/2021 with LVEF of 60 to 65%, grade 1 diastolic dysfunction.  BNP 761, CT chest with small right pleural effusion.  Differential includes ischemic cardiomyopathy or tachycardia induced from Afib  -- Diuresing with  IV Lasix 40 mg twice daily.  Renal function worsened with diuresis, though unclear how well he diuresed, as I/Os not recorded yesterday while in ED.  Lactate 2.0.  Concern for low output heart failure.  RHC would be helpful, though would want to minimize time off anticoagulation given acute PE.  Could also plan for LHC, as suspect he has severe multivessel CAD given his poorly controlled diabetes, though given AKI may need to hold off for now.  D/w Dr Shirlee Latch in Advanced Heart Failure: will place PICC to measure Co-ox, may need milrinone  New onset atrial fibrillation: Unclear duration, EKG on admission with atrial fibrillation with RVR, 128 bpm.  He was given 1 bolus of IV Cardizem 20 mg with improvement in rate.  Has been started on IV heparin.  TTE showed EF 25-30%, mild RV dysfunction, moderate AS -- IV heparin for now -- Continue metoprolol 25 mg  BID, rates appear controlled   Acute PE: CT angio with intraluminal filling defects in segmental and subsegmental branches of the right lower lobe -- IV heparin -- Management per primary   CAD: S/p Inferior STEMI w/ DES to RCA '11: Mild troponin elevation on presentation (829>562).  Denies any chest pain.  Did have known residual disease in the LAD and circumflex of 70%.  In 10/2021 was seen with plans for outpatient cardiac PET though this was never completed -- Continue IV heparin -- statin intolerant in the past,  LDL 162  AKI vs CKD stage 3B: Cr 1.77 this morning, mildly worsened from yesterday with diuesis.  Will monitor closely while diuresing.  CAP? -- antibiotics per primary -- procal negative   DM -- Hemoglobin A1c 12.5% -- Per primary -- on jardiance as outpatient    OSA: untreated, unable to tolerate mask in the past  For questions or updates, please contact [facility] Please consult [email or web address] for contact info under        Signed, Little Ishikawa, MD  10/18/2022, 9:31 AM

## 2022-10-18 NOTE — Progress Notes (Signed)
ANTICOAGULATION CONSULT NOTE - Follow Up Consult  Pharmacy Consult for Heparin Indication: atrial fibrillation and pulmonary embolus  Allergies  Allergen Reactions   Dust Mite Extract     Stuffy head   Pollen Extract     Stuffy head   Shellfish-Derived Products Swelling    JUST CRAB MEAT   Statins Other (See Comments)    Dizziness and myalgias    Patient Measurements: Height:  (175.3 cm) Weight: 90 kg (198 lb 6.6 oz) IBW/kg (Calculated) : 70.7 Heparin Dosing Weight: 88 kg  Vital Signs: Temp: 98.1 F (36.7 C) (04/23 1937) Temp Source: Axillary (04/23 1937) BP: 107/79 (04/23 2000) Pulse Rate: 98 (04/23 2000)  Labs: Recent Labs    10/21/2022 0850 10/16/2022 1002 10/16/2022 2315 10/18/22 0642 10/18/22 2101  HGB 11.3*  --   --  10.9*  --   HCT 34.6*  --   --  33.7*  --   PLT 201  --   --  226  --   HEPARINUNFRC  --   --  0.37 0.22* 0.45  CREATININE 1.63*  --   --  1.77*  --   TROPONINIHS 241* 212*  --   --   --     Estimated Creatinine Clearance: 35.1 mL/min (A) (by C-G formula based on SCr of 1.77 mg/dL (H)).  Assessment: 82 yo M presents with weakness and found to be in atrial fibrillation, not on anticoagulation PTA. CT PE also revealed small PE in segmental and subsegmental branches of RLL. Pharmacy consulted to dose heparin for acute PE and new-onset AFib.   Heparin level is now therapeutic (0.45) on 1500 units/hr, after rate increase and small bolus this am when level was subtherapeutic (0.22) on 1350 units/hr.  Goal of Therapy:  Heparin level 0.3-0.7 units/ml Monitor platelets by anticoagulation protocol: Yes   Plan:  Continue heparin drip at 1500 units/hr Next heparin level and CBC in am. Monitor for signs/symptoms of bleeding.  Nicolette Bang Donovan< RPh 10/18/2022,9:50 PM

## 2022-10-18 NOTE — Inpatient Diabetes Management (Addendum)
Inpatient Diabetes Program Recommendations  AACE/ADA: New Consensus Statement on Inpatient Glycemic Control (2015)  Target Ranges:  Prepandial:   less than 140 mg/dL      Peak postprandial:   less than 180 mg/dL (1-2 hours)      Critically ill patients:  140 - 180 mg/dL   Lab Results  Component Value Date   GLUCAP 186 (H) 10/18/2022   HGBA1C 12.5 (H) 10/09/2022    Review of Glycemic Control  Latest Reference Range & Units 10/18/22 06:03  Glucose-Capillary 70 - 99 mg/dL 454 (H)  (H): Data is abnormally high Diabetes history: Type 2 DM Outpatient Diabetes medications: Jardiance 10 mg QA, Metformin 1000 mg QA Current orders for Inpatient glycemic control: Novolog 0-15 units TID & HS  Inpatient Diabetes Program Recommendations:    Consider adding Semglee 10 units QD.  Secure chat sent to MD.  Patient agitated per notes. Secure chat sent to RN to determine if family at bedside. See when appropriate.  Follow.   Addendum: Spoke with patient and family regarding outpatient diabetes management. Verified home medications and per wife patient reports he has been taking.  Reviewed patient's current A1c of 12.5%. Explained what a A1c is and what it measures. Also reviewed goal A1c with patient, importance of good glucose control @ home, and blood sugar goals. Reviewed patho of DM, need for improved control, impact from cardiac perspective and risk for PE, survival skills, interventions, vascular changes and commorbidities.  Anticipate patient to need insulin at discharge. Patient's wife is prepared to administer injections if needed. DM team will plan to see again for insulin teaching when more appropraite.    Thanks, Lujean Rave, MSN, RNC-OB Diabetes Coordinator [phone] (8a-5p)

## 2022-10-18 NOTE — Progress Notes (Signed)
Pt informed by this RT that a Cpap has been ordered for him to use while he is asleep. Pt states he has one at home that he does not wear because he feels like he is choking while it is on. Pt informed me that I could bring one to his room but he will not wear it. I informed patient that if he has any trouble at all and changes his mind to please have his RN call us. Pt's Spo2 is 98% on room air at this time

## 2022-10-18 NOTE — Consult Note (Addendum)
Advanced Heart Failure Team Consult Note   Primary Physician: Linus Galas, NP PCP-Cardiologist:  Donato Schultz, MD  Reason for Consultation: acute systolic heart failure, concern for low output   HPI:    Zachary Nolan is seen today for evaluation of acute systolic heart failure, concern for low output, at the request of Dr. Bjorn Pippin, Cardiology.   82 y/o male w/ CAD s/p inferior STEMI in 2011 treated with primary PCI using a drug-eluting stent in the RCA. He was noted to have moderate residual CAD involving the LAD and LCx vessels that was managed medically. EF at the time was 35-40% but normalized on subsequent echos. Most recent echo 5/23 showed EF 60-65%, w/ mild-mod  AS, mean gradient 17 mmHg, and normal RV. Additional PMH includes HTN, T2DM, HLD and OSA, intolerant of CPAP.   Now admitted w/ acute HF and new atrial fibrillation w/ RVR of unknown duration. Initial rates in the 120s. Also w/ acute PE. Labs in the ED showed sodium 132, potassium 4.6, creatinine 1.6, BNP 761, high-sensitivity troponin 241>> 212, lactic acid 1.6>>2.0, WBC 9.5, hemoglobin 11.3, D-dimer 2.44.  CT angio chest with intraluminal filling defects in segmental and subsegmental branches in the right lower lobe with small thrombus burden.   He has been started on IV heparin. He was initially placed on cardizem gtt for rate control, but this was discontinued after echo showed drop in EF, now switched to PO metoprolol. IV Lasix for diuresis.   Echo shows severe biventricular failure, severe MR, moderately calcified AoV w/ at least moderate to severe AS. LVEF 20-25%, w/ global HK and moderate concentric LVH, RV severely reduced w/ moderately elevated RVSP. IVC dilated.   He has had sluggish urinary response to IV Lasix thus far. Scr trending up, though not far off from baseline, 1.63>>1.77. CO2 19. PICC has been ordered for co-ox and CVP assessment. He remains in Afib w/ RVR, V-rates 110s. BP stable. O2 sats also stable at  96% on RA. Currently getting LE dopplers done.   Currently breathing ok. Denies CP. We discussed CODE status, he is leaning towards DNR but wants to discuss further w/ wife today. He reports baseline functional status has been poor over recent months. Not very active, limited by exertional dyspnea and fatigue.      Echo 10/14/2022 1. Left ventricular ejection fraction, by estimation, is 20 to 25%. The  left ventricle has severely decreased function. The left ventricle  demonstrates global hypokinesis. There is moderate concentric left  ventricular hypertrophy. Left ventricular  diastolic function could not be evaluated.   2. Right ventricular systolic function is severely reduced. The right  ventricular size is normal. There is moderately elevated pulmonary artery  systolic pressure.   3. Left atrial size was moderately dilated.   4. Right atrial size was moderately dilated.   5. The mitral valve is normal in structure. Severe mitral valve  regurgitation. No evidence of mitral stenosis.   6. The aortic valve is moderately calcified and the RCC is fixed. The  transvalvualr gradient is not elevated by Doppler but given severe LV  dysfunction cannot esclude low gradient severe AS. Will repeat limited  study with further interrogation of the  AoV. The aortic valve is tricuspid. There is moderate calcification of the  aortic valve. Aortic valve regurgitation is not visualized. Moderate to  severe aortic valve stenosis.   7. The inferior vena cava is dilated in size with <50% respiratory  variability, suggesting right atrial pressure  of 15 mmHg.   8. EF has markedly decreased since previosu echo.   Review of Systems: [y] = yes, [ ]  = no   General: Weight gain [ ] ; Weight loss [ ] ; Anorexia [ ] ; Fatigue [ Y]; Fever [ ] ; Chills [ ] ; Weakness [Y ]  Cardiac: Chest pain/pressure [ ] ; Resting SOB [Y ]; Exertional SOB [Y ]; Orthopnea [ ] ; Pedal Edema [ ] ; Palpitations [ ] ; Syncope [ ] ; Presyncope [  ]; Paroxysmal nocturnal dyspnea[ ]   Pulmonary: Cough [ ] ; Wheezing[ ] ; Hemoptysis[ ] ; Sputum [ ] ; Snoring [Y ]  GI: Vomiting[ ] ; Dysphagia[ ] ; Melena[ ] ; Hematochezia [ ] ; Heartburn[ ] ; Abdominal pain [ ] ; Constipation [ ] ; Diarrhea [ ] ; BRBPR [ ]   GU: Hematuria[ ] ; Dysuria [ ] ; Nocturia[ ]   Vascular: Pain in legs with walking [ ] ; Pain in feet with lying flat [ ] ; Non-healing sores [ ] ; Stroke [ ] ; TIA [ ] ; Slurred speech [ ] ;  Neuro: Headaches[ ] ; Vertigo[ ] ; Seizures[ ] ; Paresthesias[ ] ;Blurred vision [ ] ; Diplopia [ ] ; Vision changes [ ]   Ortho/Skin: Arthritis [ ] ; Joint pain [ ] ; Muscle pain [ ] ; Joint swelling [ ] ; Back Pain [ ] ; Rash [ ]   Psych: Depression[ ] ; Anxiety[ ]   Heme: Bleeding problems [ ] ; Clotting disorders [ ] ; Anemia [ ]   Endocrine: Diabetes [Y ]; Thyroid dysfunction[ ]   Home Medications Prior to Admission medications   Medication Sig Start Date End Date Taking? Authorizing Provider  acetaminophen (TYLENOL) 650 MG CR tablet Take 650-1,300 mg by mouth every 8 (eight) hours as needed for pain.   Yes [provider]  ALPRAZolam Prudy Feeler) 0.5 MG tablet Take 0.5 mg by mouth at bedtime.   Yes [provider]  furosemide (LASIX) 20 MG tablet Take 40 mg by mouth as needed. 01/31/22  Yes [provider]  gabapentin (NEURONTIN) 300 MG capsule Take 300 mg by mouth at bedtime. 10/22/21  Yes [provider]  JARDIANCE 10 MG TABS tablet Take 10 mg by mouth daily. 08/23/21  Yes [provider]  metFORMIN (GLUCOPHAGE) 1000 MG tablet Take 1 tablet by mouth daily at 6 (six) AM.   Yes [provider]  metoprolol tartrate (LOPRESSOR) 25 MG tablet Take 1/2 tablet (12.5 mg total) by mouth 2 (two) times daily. 11/08/21  Yes Amin, Loura Halt, MD  traMADol (ULTRAM) 50 MG tablet Take 50 mg by mouth daily at 6 (six) AM. 01/27/22  Yes [provider]  VENTOLIN HFA 108 (90 Base) MCG/ACT inhaler Inhale 2 puffs into the lungs every 4 (four) hours as  needed for wheezing or shortness of breath. 08/27/15  Yes [provider]    Past Medical History: Past Medical History:  Diagnosis Date   CAD (coronary artery disease)    a. LHC (7/11):  Inf STEMI >>> inf AK, EF 35-40%, LAD 70-75%, mid CFX 70%, dist RCA 99% >>> PCI:  3.5 x 28 mm Promus DES to RCA;     Cancer    skin   Depression    Diabetes mellitus    Non-insulin-dependent diabetes mellitus.    DJD (degenerative joint disease)    HTN (hypertension)    Hx of cardiovascular stress test    a. Nuclear (3/14):  Small inf defect - likely scar; no ischemia, EF 59%; LOW RISK;  b. Lexiscan Myoview (1/16): No ischemia, fixed inferior defect consistent with prior infarct versus diaphragmatic attenuation, EF 57%, Low Risk   Hyperlipemia  Ischemic cardiomyopathy    EF 35-40% at time of MI in 2011 >> improved to normal on Nuclear study in 2014   OSA (obstructive sleep apnea) 09/10/2014   severe with AHI 68/hr   RLS (restless legs syndrome)    Seasonal allergies     Past Surgical History: Past Surgical History:  Procedure Laterality Date   COLONOSCOPY     NOSE SURGERY     Percutaneous coronary intervention using a drug-eluting stent (Promus)     Moderately    severe left anterior descending stenosis.  Moderate left     circumflex stenosis, moderate left ventricular dysfunction with   left  ventricular ejection fraction of 35% to 40%.    Release of left transcarpal ligament.     Release of right transcarpal ligament.     TONSILLECTOMY      Family History: Family History  Problem Relation Age of Onset   Hypertension Mother    Asthma Mother    Heart attack Father    Hypertension Father    Diabetes Father    Coronary artery disease Unknown    Heart attack Brother    Hypertension Brother    Stroke Neg Hx     Social History: Social History   Socioeconomic History   Marital status: Married    Spouse name: Not on file   Number of children: Not on file   Years of  education: Not on file   Highest education level: Not on file  Occupational History   Not on file  Tobacco Use   Smoking status: Former    Types: Cigarettes    Quit date: 1968    Years since quitting: 56.3   Smokeless tobacco: Never  Vaping Use   Vaping Use: Never used  Substance and Sexual Activity   Alcohol use: No   Drug use: No   Sexual activity: Not on file  Other Topics Concern   Not on file  Social History Narrative   Not on file   Social Determinants of Health   Financial Resource Strain: Not on file  Food Insecurity: No Food Insecurity (10/18/2022)   Hunger Vital Sign    Worried About Running Out of Food in the Last Year: Never true    Ran Out of Food in the Last Year: Never true  Transportation Needs: No Transportation Needs (10/18/2022)   PRAPARE - Administrator, Civil Service (Medical): No    Lack of Transportation (Non-Medical): No  Physical Activity: Not on file  Stress: Not on file  Social Connections: Not on file    Allergies:  Allergies  Allergen Reactions   Dust Mite Extract     Stuffy head   Pollen Extract     Stuffy head   Shellfish-Derived Products Swelling    JUST CRAB MEAT   Statins Other (See Comments)    Dizziness and myalgias    Objective:    Vital Signs:   Temp:  [97.6 F (36.4 C)-99.1 F (37.3 C)] 97.8 F (36.6 C) (04/23 0818) Pulse Rate:  [87-112] 100 (04/23 0818) Resp:  [13-32] 20 (04/23 0818) BP: (102-143)/(59-108) 118/82 (04/23 0818) SpO2:  [92 %-100 %] 95 % (04/23 0818) Weight:  [90 kg] 90 kg (04/23 0215) Last BM Date : 10/15/2022  Weight change: Filed Weights   09/26/2022 0934 10/18/22 0215  Weight: 88.5 kg 90 kg    Intake/Output:   Intake/Output Summary (Last 24 hours) at 10/18/2022 1100 Last data filed at 10/18/2022 1044 Gross per  24 hour  Intake 531.78 ml  Output 450 ml  Net 81.78 ml      Physical Exam    General:  Well appearing. No resp difficulty HEENT: normal Neck: supple. JVP . Carotids  2+ bilat; no bruits. No lymphadenopathy or thyromegaly appreciated. Cor: PMI nondisplaced. Regular rate & rhythm. No rubs, gallops or murmurs. Lungs: clear Abdomen: soft, nontender, nondistended. No hepatosplenomegaly. No bruits or masses. Good bowel sounds. Extremities: no cyanosis, clubbing, rash, edema Neuro: alert & orientedx3, cranial nerves grossly intact. moves all 4 extremities w/o difficulty. Affect pleasant   Telemetry   Atrial fibrillation 110s   EKG    Afib w/ RVR 128 bpm   Labs   Basic Metabolic Panel: Recent Labs  Lab October 21, 2022 0850 10/18/22 0642  NA 132* 135  K 4.6 3.8  CL 101 103  CO2 17* 19*  GLUCOSE 412* 184*  BUN 36* 37*  CREATININE 1.63* 1.77*  CALCIUM 9.7 9.3  MG  --  2.0    Liver Function Tests: Recent Labs  Lab Oct 21, 2022 1013  AST 14*  ALT 11  ALKPHOS 79  BILITOT 1.0  PROT 7.1  ALBUMIN 3.5   Recent Labs  Lab 10/21/22 1013  LIPASE 30   No results for input(s): "AMMONIA" in the last 168 hours.  CBC: Recent Labs  Lab 2022-10-21 0850 10/18/22 0642  WBC 9.5 7.6  HGB 11.3* 10.9*  HCT 34.6* 33.7*  MCV 89.2 88.5  PLT 201 226    Cardiac Enzymes: No results for input(s): "CKTOTAL", "CKMB", "CKMBINDEX", "TROPONINI" in the last 168 hours.  BNP: BNP (last 3 results) Recent Labs    11/05/21 1821 October 21, 2022 0850  BNP 139.3* 761.8*    ProBNP (last 3 results) No results for input(s): "PROBNP" in the last 8760 hours.   CBG: Recent Labs  Lab 10/21/2022 0833 10/21/22 1622 10/21/2022 2150 10/18/22 0603  GLUCAP 393* 316* 204* 186*    Coagulation Studies: No results for input(s): "LABPROT", "INR" in the last 72 hours.   Imaging   [place] EKG SITE RITE  Result Date: 10/18/2022 If [facility] image not attached, placement could not be confirmed due to current cardiac rhythm.  ECHOCARDIOGRAM COMPLETE  Result Date: 2022/10/21    ECHOCARDIOGRAM REPORT   Patient Name:   Najib Colmenares Date of Exam: 21-Oct-2022 Medical Rec #:  [id number]  Height:        69.0 in Accession #:    [id number] Weight:       195.0 lb Date of Birth:  05/04/1941  BSA:          2.044 m Patient Age:    82 years   BP:           128/95 mmHg Patient Gender: M          HR:           97 bpm. Exam Location:  Inpatient Procedure: 2D Echo, Cardiac Doppler and Color Doppler Indications:     I48.91* Unspeicified atrial fibrillation  History:         Patient has prior history of Echocardiogram examinations. CAD;                  Risk Factors:Hypertension, Diabetes and Dyslipidemia.  Sonographer:     Mike Gip Referring Phys:  Clydie Braun Diagnosing Phys: Arvilla Meres MD IMPRESSIONS  1. Left ventricular ejection fraction, by estimation, is 20 to 25%. The left ventricle has severely decreased function. The left ventricle demonstrates global hypokinesis. There  is moderate concentric left ventricular hypertrophy. Left ventricular diastolic function could not be evaluated.  2. Right ventricular systolic function is severely reduced. The right ventricular size is normal. There is moderately elevated pulmonary artery systolic pressure.  3. Left atrial size was moderately dilated.  4. Right atrial size was moderately dilated.  5. The mitral valve is normal in structure. Severe mitral valve regurgitation. No evidence of mitral stenosis.  6. The aortic valve is moderately calcified and the RCC is fixed. The transvalvualr gradient is not elevated by Doppler but given severe LV dysfunction cannot esclude low gradient severe AS. Will repeat limited study with further interrogation of the AoV. The aortic valve is tricuspid. There is moderate calcification of the aortic valve. Aortic valve regurgitation is not visualized. Moderate to severe aortic valve stenosis.  7. The inferior vena cava is dilated in size with <50% respiratory variability, suggesting right atrial pressure of 15 mmHg.  8. EF has markedly decreased since previosu echo. FINDINGS  Left Ventricle: Left ventricular ejection fraction, by  estimation, is 20 to 25%. The left ventricle has severely decreased function. The left ventricle demonstrates global hypokinesis. The left ventricular internal cavity size was normal in size. There is moderate concentric left ventricular hypertrophy. Left ventricular diastolic function could not be evaluated due to atrial fibrillation. Left ventricular diastolic function could not be evaluated. Right Ventricle: The right ventricular size is normal. No increase in right ventricular wall thickness. Right ventricular systolic function is severely reduced. There is moderately elevated pulmonary artery systolic pressure. The tricuspid regurgitant velocity is 2.92 m/s, and with an assumed right atrial pressure of 15 mmHg, the estimated right ventricular systolic pressure is 49.1 mmHg. Left Atrium: Left atrial size was moderately dilated. Right Atrium: Right atrial size was moderately dilated. Pericardium: There is no evidence of pericardial effusion. Mitral Valve: The mitral valve is normal in structure. Severe mitral valve regurgitation, with centrally-directed jet. No evidence of mitral valve stenosis. Tricuspid Valve: The tricuspid valve is normal in structure. Tricuspid valve regurgitation is mild . No evidence of tricuspid stenosis. Aortic Valve: The aortic valve is moderately calcified and the RCC is fixed. The transvalvualr gradient is not elevated by Doppler but given severe LV dysfunction cannot esclude low gradient severe AS. Will repeat limited study with further interrogation  of the AoV. The aortic valve is tricuspid. There is moderate calcification of the aortic valve. Aortic valve regurgitation is not visualized. Moderate to severe aortic stenosis is present. Pulmonic Valve: The pulmonic valve was normal in structure. Pulmonic valve regurgitation is mild. No evidence of pulmonic stenosis. Aorta: The aortic root is normal in size and structure. Venous: The inferior vena cava is dilated in size with less than  50% respiratory variability, suggesting right atrial pressure of 15 mmHg. IAS/Shunts: No atrial level shunt detected by color flow Doppler.  LEFT VENTRICLE PLAX 2D LVIDd:         5.20 cm      Diastology LVIDs:         4.60 cm      LV e' medial:    6.42 cm/s LV PW:         1.40 cm      LV E/e' medial:  16.0 LV IVS:        1.50 cm      LV e' lateral:   9.46 cm/s LVOT diam:     2.20 cm      LV E/e' lateral: 10.9 LV SV:  38 LV SV Index:   19 LVOT Area:     3.80 cm  LV Volumes (MOD) LV vol d, MOD A2C: 164.0 ml LV vol d, MOD A4C: 142.0 ml LV vol s, MOD A2C: 116.0 ml LV vol s, MOD A4C: 112.0 ml LV SV MOD A2C:     48.0 ml LV SV MOD A4C:     142.0 ml LV SV MOD BP:      41.1 ml RIGHT VENTRICLE            IVC RV Basal diam:  3.80 cm    IVC diam: 2.50 cm RV S prime:     9.79 cm/s TAPSE (M-mode): 1.4 cm LEFT ATRIUM              Index        RIGHT ATRIUM           Index LA diam:        4.80 cm  2.35 cm/m   RA Area:     22.80 cm LA Vol (A2C):   112.0 ml 54.80 ml/m  RA Volume:   65.20 ml  31.90 ml/m LA Vol (A4C):   95.9 ml  46.92 ml/m LA Biplane Vol: 104.0 ml 50.88 ml/m  AORTIC VALVE LVOT Vmax:   56.30 cm/s LVOT Vmean:  42.500 cm/s LVOT VTI:    0.100 m  AORTA Ao Root diam: 3.40 cm Ao Asc diam:  3.70 cm MITRAL VALVE                  TRICUSPID VALVE MV Area (PHT): 4.74 cm       TR Peak grad:   34.1 mmHg MV Decel Time: 160 msec       TR Vmax:        292.00 cm/s MR Peak grad:    90.6 mmHg MR Mean grad:    57.0 mmHg    SHUNTS MR Vmax:         476.00 cm/s  Systemic VTI:  0.10 m MR Vmean:        346.0 cm/s   Systemic Diam: 2.20 cm MR PISA:         1.57 cm MR PISA Eff ROA: 11 mm MR PISA Radius:  0.50 cm MV E velocity: 103.00 cm/s Arvilla Meres MD Electronically signed by Arvilla Meres MD Signature Date/Time: 10/15/2022/4:16:02 PM    Final (Updated)    ECHOCARDIOGRAM LIMITED  Result Date: 10/02/2022    ECHOCARDIOGRAM LIMITED REPORT   Patient Name:   MARDELL CRAGG Date of Exam: 10/01/2022 Medical Rec #:  [id number]   Height:       69.0 in Accession #:    [id number] Weight:       195.0 lb Date of Birth:  [birth year]  BSA:          2.044 m Patient Age:    82 years   BP:           143/92 mmHg Patient Gender: M          HR:           85 bpm. Exam Location:  Inpatient Procedure: 2D Echo, Cardiac Doppler and Color Doppler Indications:    Atrial fibrillation  History:        Patient has prior history of Echocardiogram examinations.  Sonographer:    NA Referring Phys: [id number] DANIEL R BENSIMHON IMPRESSIONS  1. Left ventricular ejection fraction, by estimation, is 25 to 30%. The left ventricle has severely decreased function.  The left ventricle demonstrates global hypokinesis. Left ventricular diastolic parameters are indeterminate.  2. Right ventricular systolic function is mildly reduced. The right ventricular size is normal.  3. The mitral valve is normal in structure. Trivial mitral valve regurgitation. No evidence of mitral stenosis.  4. The aortic valve is tricuspid. There is severe calcifcation of the aortic valve. Aortic valve regurgitation is not visualized. Low flow/low gradient moderate aortic valve stenosis. Aortic valve area, by VTI measures 1.38 cm. Aortic valve mean gradient measures 16.0 mmHg.  5. Aortic dilatation noted. There is mild dilatation of the ascending aorta, measuring 38 mm.  6. IVC not visualized.  7. The patient is in atrial fibrillation.  8. Limited echo FINDINGS  Left Ventricle: Left ventricular ejection fraction, by estimation, is 25 to 30%. The left ventricle has severely decreased function. The left ventricle demonstrates global hypokinesis. Left ventricular diastolic parameters are indeterminate. Right Ventricle: The right ventricular size is normal. Right ventricular systolic function is mildly reduced. Pericardium: Trivial pericardial effusion is present. Mitral Valve: The mitral valve is normal in structure. Trivial mitral valve regurgitation. No evidence of mitral valve stenosis. Tricuspid Valve: The  tricuspid valve is normal in structure. Tricuspid valve regurgitation is not demonstrated. Aortic Valve: The aortic valve is tricuspid. There is severe calcifcation of the aortic valve. Aortic valve regurgitation is not visualized. Moderate aortic stenosis is present. Aortic valve mean gradient measures 16.0 mmHg. Aortic valve peak gradient measures 21.9 mmHg. Aortic valve area, by VTI measures 1.38 cm. Pulmonic Valve: The pulmonic valve was normal in structure. Pulmonic valve regurgitation is trivial. Aorta: Aortic dilatation noted. There is mild dilatation of the ascending aorta, measuring 38 mm. LEFT VENTRICLE PLAX 2D LVOT diam:     2.30 cm LV SV:         52 LV SV Index:   25 LVOT Area:     4.15 cm  AORTIC VALVE AV Area (Vmax):    1.30 cm AV Area (Vmean):   1.31 cm AV Area (VTI):     1.38 cm AV Vmax:           234.00 cm/s AV Vmean:          180.667 cm/s AV VTI:            0.376 m AV Peak Grad:      21.9 mmHg AV Mean Grad:      16.0 mmHg LVOT Vmax:         73.45 cm/s LVOT Vmean:        56.750 cm/s LVOT VTI:          0.125 m LVOT/AV VTI ratio: 0.33  SHUNTS Systemic VTI:  0.12 m Systemic Diam: 2.30 cm Khaila Velarde McleanMD Electronically signed by Wilfred Lacy Signature Date/Time: 10/16/2022/5:22:23 PM    Final    CT Angio Chest PE W and/or Wo Contrast  Result Date: 10/06/2022 CLINICAL DATA:  Shortness of breath EXAM: CT ANGIOGRAPHY CHEST WITH CONTRAST TECHNIQUE: Multidetector CT imaging of the chest was performed using the standard protocol during bolus administration of intravenous contrast. Multiplanar CT image reconstructions and MIPs were obtained to evaluate the vascular anatomy. RADIATION DOSE REDUCTION: This exam was performed according to the departmental dose-optimization program which includes automated exposure control, adjustment of the mA and/or kV according to patient size and/or use of iterative reconstruction technique. CONTRAST:  60mL OMNIPAQUE IOHEXOL 350 MG/ML SOLN COMPARISON:  Chest  radiographs done earlier today FINDINGS: Cardiovascular: Heart is enlarged in size. Extensive coronary artery calcifications are  seen. RV LV ratio is 1.2. Contrast enhancement in the thoracic aorta is less than adequate to evaluate the lumen. There are intraluminal filling defects seen segmental and subsegmental pulmonary artery branches in right lower lobe. Mediastinum/Nodes: There are subcentimeter nodes in mediastinum and hilar regions. There is prominent pericardial recess. Lungs/Pleura: There are linear patchy infiltrates in right middle lobe and right lower lobe. Small right pleural effusion is seen. Upper Abdomen: Gallbladder stones are seen. Musculoskeletal: There is decrease in height of the bodies of T4 and T11 vertebrae without demonstrable break in the cortical margins. Findings may suggest old compression fractures. Review of the MIP images confirms the above findings. IMPRESSION: There are intraluminal filling defects in segmental and subsegmental branches in right lower lobe with small thrombus burden. RV LV ratio is 1.2 which may be related to chronic right heart strain. Coronary artery disease. Cardiomegaly. Small right pleural effusion. There are patchy infiltrates in right middle lobe and right lower lobe suggesting atelectasis/pneumonia. There is decrease in height of the bodies of T4 and T11 vertebrae without demonstrable breaking the cortical margins suggesting possible old compression fractures. Gallbladder stones. Electronically Signed   By: Ernie Avena M.D.   On: 11-13-22 12:15   CT ABDOMEN PELVIS W CONTRAST  Result Date: 11/13/22 CLINICAL DATA:  Abdominal pain EXAM: CT ABDOMEN AND PELVIS WITH CONTRAST TECHNIQUE: Multidetector CT imaging of the abdomen and pelvis was performed using the standard protocol following bolus administration of intravenous contrast. RADIATION DOSE REDUCTION: This exam was performed according to the departmental dose-optimization program which  includes automated exposure control, adjustment of the mA and/or kV according to patient size and/or use of iterative reconstruction technique. CONTRAST:  60mL OMNIPAQUE IOHEXOL 350 MG/ML SOLN COMPARISON:  None Available. FINDINGS: Lower chest: Small right pleural effusion is seen. Patchy infiltrates are seen in right lower lung field. Coronary artery calcifications are seen. Hepatobiliary: There are multiple calcified gallbladder stones. There is no dilation of bile ducts. Pancreas: No focal abnormalities are seen. Spleen: Unremarkable. Adrenals/Urinary Tract: Adrenals are unremarkable. There is no hydronephrosis. There are no renal or ureteral stones. Urinary bladder is unremarkable. Stomach/Bowel: Stomach is not distended. Small bowel loops are not dilated. Appendix is not dilated. There is no significant wall thickening in colon. Scattered diverticula seen in colon without signs of focal diverticulitis. Vascular/Lymphatic: Calcifications are seen in aorta and its major branches. Reproductive: Prostate is enlarged. Other: There is no ascites or pneumoperitoneum. Small paraumbilical hernia containing fat is seen. Musculoskeletal: Degenerative changes are noted with disc space narrowing, bony spurs and encroachment of neural foramina at multiple levels. Degenerative changes are noted in both hips with joint space narrowing, bony spurs and subcortical cysts. Possible old bone infarct is seen in the shaft of left femur. IMPRESSION: There is no evidence of intestinal obstruction or pneumoperitoneum. There is no hydronephrosis. Appendix is unremarkable8. Gallbladder stones. Scattered diverticula are seen in colon without signs of focal diverticulitis. Enlarged prostate. Small right pleural effusion. Patchy infiltrates in the right lower lung field may suggest atelectasis/pneumonia. Coronary artery disease. Arteriosclerosis. Lumbar spondylosis. Marked degenerative changes are noted in both hips. Electronically Signed    By: Ernie Avena M.D.   On: 2022/11/13 11:59     Medications:     Current Medications:  ALPRAZolam  0.5 mg Oral QHS   furosemide  40 mg Intravenous BID   gabapentin  300 mg Oral QHS   insulin aspart  0-15 Units Subcutaneous TID WC   insulin aspart  0-5 Units Subcutaneous  QHS   insulin glargine-yfgn  10 Units Subcutaneous Daily   metoprolol tartrate  25 mg Oral BID   [START ON 10/19/2022] pneumococcal 20-valent conjugate vaccine  0.5 mL Intramuscular Tomorrow-1000   sodium chloride flush  3 mL Intravenous Q12H    Infusions:  heparin 1,500 Units/hr (10/18/22 1039)      Patient Profile   82 y/o male w/ CAD s/p inferior STEMI in 2011 w/ PCI to RCA, residual mod LAD and LCx treated medically, h/o systolic heart failure w/ improved EF, T2DM, HTN, HLD, CKD IIIb and untreated OSA, admitted w/ acute systolic heart failure, new Afib w/ RVR and acute PE.   Assessment/Plan   1. Acute Systolic Heart Failure - prior h/o low EF, 35-40% post inferior STEMI in 2011. Improved on subsequent echos, up to 60-65% on echo 5/23, RV normal  - EF back down again, now 20-25% w/ severely reduced RV, global HK, mod LVH, mod-severe AS and severe MR, NYHA class IIIb. Labs concerning for low output - Etiology uncertain. ? Ischemic vs tachymediated, vs valvular (? Severe AS), vs infiltrative  - Place PICC. Check co-ox and CVPs. May need inotropic support w/ DBA if low output  - R/LHC if renal fx permits - GDMT currently limited by soft BP and renal fx  - given age and poor baseline functional status, will need to engage palliative care for GOC discussions. Not candidate for advanced therapies    2. Afib w/ RVR - new diagnosis. Duration unknown  - heparin gtt - if low output and need for DBA, will need amiodarone gtt for rate control  - no plans for DCCV given acute PE   3. Aortic Stenosis  - mild-mod by echo 5/23, mG 17 mmHg - echo this admit shows moderately calcified AoV, w/ suspected  mod-severe AS, suspect LFLG  - age may prohibit TAVR, can d/w structural team  - avoid hypotension   4. Severe Mitral Regurgitation  - likely functional  - HF and afib optimization per above - ? Eventual MitraClip, though age may be prohibitive, d/w structural team   5. Acute PE  - D-dimer 2.44.  CT angio chest with intraluminal filling defects in segmental and subsegmental branches in the right lower lobe with small thrombus burden. Hemodynamics/ O2 sats stable  - LE Venous dopplers pending - ? Cardioembolic, 2/2 low flow state from severely reduced HK RV - continue IV heparin, switch to DOAC after completion of invasive procedures  6. CAD - remote inferior STEMI 2011 w/ PCI to RCA + residual mod LAD and LCx dz managed medically  - denies recent CP but w/ drop in EF cannot r/o disease progression  - Hs trop trend not c/w ACS, suspect demand ischemia (241>>212)  - possible LHC, depending on trajectory of renal fx  - medical management for now.  - ASA 81 mg. Add statin. No ? blocker w/ suspected low output    7. HLD, Goal LDL < 55 - not on Statin PTA due to intolerance   - LDL 162 mg/dL  - recommend statin if pt willing to retry  - consider lipid clinic referral   8. CKD IIIb - baseline SCr appears to be ~1.6 - Scr 1.8 today, increasing w/ attempts at diuresis>> concerning for low output - PICC today for co-ox/CVP monitoring  - follow BMP - avoid hypotension   9. GOC - multiple cardiac issues, comorbitites and poor baseline functional status. Now w/ severe biventricular heart failure, severe AS and MR, not  a candidate for advanced therapies and would also be poor candidate for transcatheter valve repair/replacement.  We discussed CODE status, he is leaning towards DNR but wants to discuss further w/ wife today. I will consult palliative care team to assist w/ GOC discussion. Keep full CODE for now until further decisions are made    Length of Stay: 1  Brittainy Sharol Harness, PA-C   10/18/2022, 11:00 AM  Advanced Heart Failure Team Pager [phone] (M-F; 7a - 5p)  Please contact [facility] for night-coverage after hours (4p -7a ) and weekends on [email or web address]  Patient seen with PA, agree with the above note.   Patient was admitted with dyspnea, atrial fibrillation with RVR in 120s, and acute RLL PE.  His wife says that he has been "sluggish" for 2 months but significantly worse for 2 weeks.  No chest pain.  Creatinine has risen slightly with attempted diuresis, up to 1.77 today.  He is in atrial fibrillation now with rate 100s.   Echo after admission showed EF 25%, mid-moderate RV dysfunction, low flow/low gradient moderate AS, mod-severe MR, dilated IVC.    Patient is dyspneic speaking to me. Unable to sit up at the side of bed.   General: NAD Neck: JVP 14, no thyromegaly or thyroid nodule.  Lungs: Decreased at bases. CV: Nondisplaced PMI.  Heart irregular S1/S2, no S3/S4, 2/6 SEM RUSB.  Trace ankle edema.  No carotid bruit.  Difficult to palpate pedal pulses.  Abdomen: Soft, nontender, no hepatosplenomegaly, no distention.  Skin: Intact without lesions or rashes.  Neurologic: Alert and oriented x 3.  Psych: Normal affect. Extremities: No clubbing or cyanosis.  HEENT: Normal.   1. Acute on chronic systolic CHF: Patient had STEMI in 2011 with ischemic cardiomyopathy at that time, EF 35-40%.  In 5/23, EF up to 60-65%.  This admission, echo showed EF 25%, mid-moderate RV dysfunction, low flow/low gradient moderate AS, mod-severe MR, dilated IVC.  Cause for fall in EF uncertain.  Possibly due to atrial fibrillation with RVR, uncertain how long he has been in AF.  It is also possible that he has had worsening of CAD with ischemic CMP.  However, he did not present with ACS and denies chest pain.  Volume is somewhat difficult to assess but he is dyspneic and think he is volume overloaded.  I am concerned for low output HF.  PICC has been placed.  - Send co-ox and check CVP  => 17-18.  - Lasix 80 mg IV bid.  - Will start milrinone 0.25 after co-ox is sent.  - Will need eventual LHC/RHC to assess for worsening coronary disease.  - Will need eventual cardioversion.  2. Atrial fibrillation: New diagnosis, admitted with RVR of uncertain duration.  Possible tachy-mediated CMP.   - Continue heparin gtt.  - Amiodarone gtt.  - Will need TEE-DCCV when he is better diuresed.  3. PE: Acute PE, RLL.  Patient's wife says that he has been essentially immobile for the last 2 wks.  No h/o VTE.   - Heparin gtt.  4. CAD: H/o inferior STEMI in 2011 with DES to distal RCA. No chest pain this admission, troponin mildly elevated with no trend.  Doubt ACS.  As above, concern that worsening CAD could account for fall in EF.  - Unable to tolerate statins, add Zetia 10 mg daily. - Will need eventual coronary angiography.  5. CKD stage 3: Creatinine 1.6 => 1.77 since admission, ?cardiorenal.  Follow closely.  6. Aortic stenosis: Low flow/low gradient  moderate AS on echo, follow. 7. Mitral regurgitation: Moderate-severe, probably functional.  Hopefully this will improve with diuresis and NSR.   Marca Ancona 10/18/2022 3:32 PM

## 2022-10-18 NOTE — Progress Notes (Signed)
Peripherally Inserted Central Catheter Placement  The IV Nurse has discussed with the patient and/or persons authorized to consent for the patient, the purpose of this procedure and the potential benefits and risks involved with this procedure.  The benefits include less needle sticks, lab draws from the catheter, and the patient may be discharged home with the catheter. Risks include, but not limited to, infection, bleeding, blood clot (thrombus formation), and puncture of an artery; nerve damage and irregular heartbeat and possibility to perform a PICC exchange if needed/ordered by physician.  Alternatives to this procedure were also discussed.  Bard Power PICC patient education guide, fact sheet on infection prevention and patient information card has been provided to patient /or left at bedside.    PICC Placement Documentation  PICC Double Lumen 10/18/22 Right Basilic 39 cm 0 cm (Active)  Indication for Insertion or Continuance of Line Vasoactive infusions 10/18/22 1415  Exposed Catheter (cm) 0 cm 10/18/22 1415  Site Assessment Clean, Dry, Intact 10/18/22 1415  Lumen #1 Status Flushed;Saline locked;Blood return noted 10/18/22 1415  Lumen #2 Status Flushed;Saline locked;Blood return noted 10/18/22 1415  Dressing Type Transparent;Securing device 10/18/22 1415  Dressing Status Antimicrobial disc in place;Clean, Dry, Intact 10/18/22 1415  Safety Lock Intact 10/18/22 1415  Line Adjustment (NICU/IV Team Only) No 10/18/22 1415  Dressing Intervention New dressing;Other (Comment) 10/18/22 1415  Dressing Change Due 10/25/22 10/18/22 1415       [name] 10/18/2022, 2:30 PM

## 2022-10-18 NOTE — TOC Benefit Eligibility Note (Signed)
Patient Advocate Encounter  Insurance verification completed.    The patient is currently admitted and upon discharge could be taking Eliquis 5 mg.  The current 30 day co-pay is $47.00.   The patient is currently admitted and upon discharge could be taking Xarelto 20 mg.  The current 30 day co-pay is $47.00.   The patient is insured through [employer] Medicare Part D   This test claim was processed through [facility]- copay amounts may vary at other pharmacies due to pharmacy/plan contracts, or as the patient moves through the different stages of their insurance plan.  Edwyna Dangerfield, CPHT Pharmacy Patient Advocate Specialist [facility] Patient Advocate Team Direct Number: [phone]  Fax: [phone]       

## 2022-10-18 NOTE — Progress Notes (Signed)
PROGRESS NOTE    Zachary Nolan  ZOX:[id number] DOB: [birth year] DOA: 2022-10-18 PCP: Linus Galas, NP   Brief Narrative:   82 y.o. male with medical history significant of hypertension, hyperlipidemia, CAD s/p DES to RCA in 2011, diabetes mellitus type 2, and BPH who presented with complaints of shortness of breath is worsened over the last 2 weeks.  Upon admission patient diagnosed with pulmonary embolism with CHF, echocardiogram noting EF of 25%.  Patient started on IV Lasix, cardiology consulted.  Initially on empiric antibiotics for pneumonia which I have stopped.   Assessment & Plan:  Principal Problem:   New onset atrial fibrillation Active Problems:   Multiple subsegmental pulmonary emboli without acute cor pulmonale   Acute on chronic diastolic heart failure   Coronary artery disease involving native coronary artery of native heart without angina pectoris   Elevated troponin   CAP (community acquired pneumonia)   Uncontrolled type 2 diabetes mellitus with hyperglycemia, without long-term current use of insulin   CKD (chronic kidney disease) stage 3, GFR 30-59 ml/min   Hyponatremia   OSA (obstructive sleep apnea)      New onset atrial fibrillation Secondary to acute underlying illness.  Currently on IV heparin with eventual plans to transition to Eliquis. Metoprolol twice daily, titrate as necessary Cardiology following TSH   Multiple subsegmental pulmonary emboli without cor pulmonale Acute findings on the CTA of the chest.  Currently on heparin drip, eventually will transition to Eliquis. Echocardiogram Lower extremity Dopplers   Acute congestive heart failure with reduced ejection fraction, 30% Echo in May 2023 showed preserved EF but now showing reduced EF with global hypokinesia and moderate AS.  Poor diuresis with Lasix with mild bump in creatinine, cardiology team considering starting patient on milrinone to help promote diuresis.  Will defer ischemic workup to their  service   Elevated troponin CAD status post DES to RCA in 2011 Secondary to demand ischemia   Possible community-acquired pneumonia Initial concerns of pneumonia.  I suspect this is fluid overload, procalcitonin negative.  Will stop antibiotics   Uncontrolled diabetes mellitus type 2, with without long-term use of insulin A1c 12.5, uncontrolled.  Home medication on hold.  On sliding scale and Accu-Chek.  Will consult diabetic coordinator   Chronic kidney disease stage IIIb Creatinine 1.63>1.77 with BUN 36, but appears around patient's baseline   Hyponatremia Sodium improved, likely from fluid overload   OSA  Patient not on CPAP due to being unable to tolerate    DVT prophylaxis: Heparin Advance Care Planning:   Code Status: Full Code  Consults: Cardiology   Family Communication:Wife update over the phone       Diet Orders (From admission, onward)     Start     Ordered   18-Oct-2022 1305  Diet Carb Modified Fluid consistency: Thin; Room service appropriate? Yes  Diet effective now       Question Answer Comment  Diet-HS Snack? Nothing   Calorie Level Medium 1600-2000   Fluid consistency: Thin   Room service appropriate? Yes      2022-10-18 1305            Subjective: Seen at bedside, reporting of orthopnea and shortness of breath with any exertion.   Examination:  General exam: Appears calm and comfortable  Respiratory system: Bibasilar crackles Cardiovascular system: S1 & S2 heard, RRR. No JVD, murmurs, rubs, gallops or clicks. No pedal edema. Gastrointestinal system: Abdomen is nondistended, soft and nontender. No organomegaly or masses felt. Normal bowel sounds  heard. Central nervous system: Alert and oriented. No focal neurological deficits. Extremities: Symmetric 5 x 5 power. Skin: No rashes, lesions or ulcers Psychiatry: Judgement and insight appear normal. Mood & affect appropriate.  Objective: Vitals:   04-Nov-2022 2338 10/18/22 0045 10/18/22 0215  10/18/22 0300  BP: 120/77 (!) 120/100 118/87   Pulse: (!) 105 99 (!) 105 (!) 105  Resp: (!) 29 (!) 29 (!) 21   Temp:  99.1 F (37.3 C) 97.6 F (36.4 C) 97.6 F (36.4 C)  TempSrc:  Oral Oral Oral  SpO2: 92% 96% 100% 94%  Weight:   90 kg   Height:        Intake/Output Summary (Last 24 hours) at 10/18/2022 0730 Last data filed at 10/18/2022 0324 Gross per 24 hour  Intake 531.78 ml  Output 0 ml  Net 531.78 ml   Filed Weights   11-04-22 0934 10/18/22 0215  Weight: 88.5 kg 90 kg    Scheduled Meds:  ALPRAZolam  0.5 mg Oral QHS   furosemide  40 mg Intravenous BID   gabapentin  300 mg Oral QHS   insulin aspart  0-15 Units Subcutaneous TID WC   insulin aspart  0-5 Units Subcutaneous QHS   metoprolol tartrate  25 mg Oral BID   [START ON 10/19/2022] pneumococcal 20-valent conjugate vaccine  0.5 mL Intramuscular Tomorrow-1000   sodium chloride flush  3 mL Intravenous Q12H   Continuous Infusions:  heparin 1,350 Units/hr (10/18/22 0241)    Nutritional status     Body mass index is 29.3 kg/m.  Data Reviewed:   CBC: Recent Labs  Lab 2022/11/04 0850 10/18/22 0642  WBC 9.5 7.6  HGB 11.3* 10.9*  HCT 34.6* 33.7*  MCV 89.2 88.5  PLT 201 226   Basic Metabolic Panel: Recent Labs  Lab Nov 04, 2022 0850  NA 132*  K 4.6  CL 101  CO2 17*  GLUCOSE 412*  BUN 36*  CREATININE 1.63*  CALCIUM 9.7   GFR: Estimated Creatinine Clearance: 38.1 mL/min (A) (by C-G formula based on SCr of 1.63 mg/dL (H)). Liver Function Tests: Recent Labs  Lab Nov 04, 2022 1013  AST 14*  ALT 11  ALKPHOS 79  BILITOT 1.0  PROT 7.1  ALBUMIN 3.5   Recent Labs  Lab 11/04/2022 1013  LIPASE 30   No results for input(s): "AMMONIA" in the last 168 hours. Coagulation Profile: No results for input(s): "INR", "PROTIME" in the last 168 hours. Cardiac Enzymes: No results for input(s): "CKTOTAL", "CKMB", "CKMBINDEX", "TROPONINI" in the last 168 hours. BNP (last 3 results) No results for input(s): "PROBNP" in  the last 8760 hours. HbA1C: Recent Labs    11-04-22 1534  HGBA1C 12.5*   CBG: Recent Labs  Lab Nov 04, 2022 0833 11-04-22 1622 10/18/22 0603  GLUCAP 393* 316* 186*   Lipid Profile: No results for input(s): "CHOL", "HDL", "LDLCALC", "TRIG", "CHOLHDL", "LDLDIRECT" in the last 72 hours. Thyroid Function Tests: No results for input(s): "TSH", "T4TOTAL", "FREET4", "T3FREE", "THYROIDAB" in the last 72 hours. Anemia Panel: No results for input(s): "VITAMINB12", "FOLATE", "FERRITIN", "TIBC", "IRON", "RETICCTPCT" in the last 72 hours. Sepsis Labs: Recent Labs  Lab Nov 04, 2022 0850 2022-11-04 1230 11-04-22 1534  PROCALCITON <0.10  --   --   LATICACIDVEN  --  1.6 2.0*    Recent Results (from the past 240 hour(s))  Resp panel by RT-PCR (RSV, Flu A&B, Covid) Anterior Nasal Swab     Status: None   Collection Time: 04-Nov-2022  8:36 AM   Specimen: Anterior Nasal Swab  Result Value Ref Range Status   SARS Coronavirus 2 by RT PCR NEGATIVE NEGATIVE Final   Influenza A by PCR NEGATIVE NEGATIVE Final   Influenza B by PCR NEGATIVE NEGATIVE Final    Comment: (NOTE) The Xpert Xpress SARS-CoV-2/FLU/RSV plus assay is intended as an aid in the diagnosis of influenza from Nasopharyngeal swab specimens and should not be used as a sole basis for treatment. Nasal washings and aspirates are unacceptable for Xpert Xpress SARS-CoV-2/FLU/RSV testing.  Fact Sheet for Patients: BloggerCourse.com  Fact Sheet for Healthcare Providers: SeriousBroker.it  This test is not yet approved or cleared by the [place] FDA and has been authorized for detection and/or diagnosis of SARS-CoV-2 by FDA under an Emergency Use Authorization (EUA). This EUA will remain in effect (meaning this test can be used) for the duration of the COVID-19 declaration under Section 564(b)(1) of the Act, 21 U.S.C. section 360bbb-3(b)(1), unless the authorization is terminated  or revoked.     Resp Syncytial Virus by PCR NEGATIVE NEGATIVE Final    Comment: (NOTE) Fact Sheet for Patients: BloggerCourse.com  Fact Sheet for Healthcare Providers: SeriousBroker.it  This test is not yet approved or cleared by the [place] FDA and has been authorized for detection and/or diagnosis of SARS-CoV-2 by FDA under an Emergency Use Authorization (EUA). This EUA will remain in effect (meaning this test can be used) for the duration of the COVID-19 declaration under Section 564(b)(1) of the Act, 21 U.S.C. section 360bbb-3(b)(1), unless the authorization is terminated or revoked.  Performed at [facility], [address] [place], [place] [address]   Culture, blood (routine x 2)     Status: None (Preliminary result)   Collection Time: 10/06/2022 12:30 PM   Specimen: BLOOD RIGHT ARM  Result Value Ref Range Status   Specimen Description BLOOD RIGHT ARM  Final   Special Requests   Final    BOTTLES DRAWN AEROBIC AND ANAEROBIC Blood Culture adequate volume   Culture   Final    NO GROWTH < 24 HOURS Performed at [facility], [address] [place], [place] [address]    Report Status PENDING  Incomplete  Culture, blood (routine x 2)     Status: None (Preliminary result)   Collection Time: 10/19/2022  3:34 PM   Specimen: BLOOD RIGHT FOREARM  Result Value Ref Range Status   Specimen Description BLOOD RIGHT FOREARM  Final   Special Requests   Final    BOTTLES DRAWN AEROBIC AND ANAEROBIC Blood Culture adequate volume   Culture   Final    NO GROWTH < 24 HOURS Performed at [facility], [address], [place], [place] [address]    Report Status PENDING  Incomplete  MRSA Next Gen by PCR, Nasal     Status: None   Collection Time: 10/18/22  2:16 AM   Specimen: Nasal Mucosa; Nasal Swab  Result Value Ref Range Status   MRSA by PCR Next Gen NOT DETECTED NOT DETECTED Final    Comment: (NOTE) The GeneXpert  MRSA Assay (FDA approved for NASAL specimens only), is one component of a comprehensive MRSA colonization surveillance program. It is not intended to diagnose MRSA infection nor to guide or monitor treatment for MRSA infections. Test performance is not FDA approved in patients less than 63 years old. Performed at [facility], [address], [place], [place] [address]          Radiology Studies: ECHOCARDIOGRAM COMPLETE  Result Date: 10/21/2022  ECHOCARDIOGRAM REPORT   Patient Name:   Zachary Nolan Date of Exam: 10/10/2022 Medical Rec #:  [id number]  Height:       69.0 in Accession #:    [id number] Weight:       195.0 lb Date of Birth:  31-Jan-1941  BSA:          2.044 m Patient Age:    82 years   BP:           128/95 mmHg Patient Gender: M          HR:           97 bpm. Exam Location:  Inpatient Procedure: 2D Echo, Cardiac Doppler and Color Doppler Indications:     I48.91* Unspeicified atrial fibrillation  History:         Patient has prior history of Echocardiogram examinations. CAD;                  Risk Factors:Hypertension, Diabetes and Dyslipidemia.  Sonographer:     Mike Gip Referring Phys:  Clydie Braun Diagnosing Phys: Arvilla Meres MD IMPRESSIONS  1. Left ventricular ejection fraction, by estimation, is 20 to 25%. The left ventricle has severely decreased function. The left ventricle demonstrates global hypokinesis. There is moderate concentric left ventricular hypertrophy. Left ventricular diastolic function could not be evaluated.  2. Right ventricular systolic function is severely reduced. The right ventricular size is normal. There is moderately elevated pulmonary artery systolic pressure.  3. Left atrial size was moderately dilated.  4. Right atrial size was moderately dilated.  5. The mitral valve is normal in structure. Severe mitral valve regurgitation. No evidence of mitral stenosis.  6. The aortic valve is moderately calcified and the RCC is fixed. The transvalvualr  gradient is not elevated by Doppler but given severe LV dysfunction cannot esclude low gradient severe AS. Will repeat limited study with further interrogation of the AoV. The aortic valve is tricuspid. There is moderate calcification of the aortic valve. Aortic valve regurgitation is not visualized. Moderate to severe aortic valve stenosis.  7. The inferior vena cava is dilated in size with <50% respiratory variability, suggesting right atrial pressure of 15 mmHg.  8. EF has markedly decreased since previosu echo. FINDINGS  Left Ventricle: Left ventricular ejection fraction, by estimation, is 20 to 25%. The left ventricle has severely decreased function. The left ventricle demonstrates global hypokinesis. The left ventricular internal cavity size was normal in size. There is moderate concentric left ventricular hypertrophy. Left ventricular diastolic function could not be evaluated due to atrial fibrillation. Left ventricular diastolic function could not be evaluated. Right Ventricle: The right ventricular size is normal. No increase in right ventricular wall thickness. Right ventricular systolic function is severely reduced. There is moderately elevated pulmonary artery systolic pressure. The tricuspid regurgitant velocity is 2.92 m/s, and with an assumed right atrial pressure of 15 mmHg, the estimated right ventricular systolic pressure is 49.1 mmHg. Left Atrium: Left atrial size was moderately dilated. Right Atrium: Right atrial size was moderately dilated. Pericardium: There is no evidence of pericardial effusion. Mitral Valve: The mitral valve is normal in structure. Severe mitral valve regurgitation, with centrally-directed jet. No evidence of mitral valve stenosis. Tricuspid Valve: The tricuspid valve is normal in structure. Tricuspid valve regurgitation is mild . No evidence of tricuspid stenosis. Aortic Valve: The aortic valve is moderately calcified and the RCC is fixed. The transvalvualr gradient is not  elevated by Doppler but given severe LV dysfunction cannot esclude  low gradient severe AS. Will repeat limited study with further interrogation  of the AoV. The aortic valve is tricuspid. There is moderate calcification of the aortic valve. Aortic valve regurgitation is not visualized. Moderate to severe aortic stenosis is present. Pulmonic Valve: The pulmonic valve was normal in structure. Pulmonic valve regurgitation is mild. No evidence of pulmonic stenosis. Aorta: The aortic root is normal in size and structure. Venous: The inferior vena cava is dilated in size with less than 50% respiratory variability, suggesting right atrial pressure of 15 mmHg. IAS/Shunts: No atrial level shunt detected by color flow Doppler.  LEFT VENTRICLE PLAX 2D LVIDd:         5.20 cm      Diastology LVIDs:         4.60 cm      LV e' medial:    6.42 cm/s LV PW:         1.40 cm      LV E/e' medial:  16.0 LV IVS:        1.50 cm      LV e' lateral:   9.46 cm/s LVOT diam:     2.20 cm      LV E/e' lateral: 10.9 LV SV:         38 LV SV Index:   19 LVOT Area:     3.80 cm  LV Volumes (MOD) LV vol d, MOD A2C: 164.0 ml LV vol d, MOD A4C: 142.0 ml LV vol s, MOD A2C: 116.0 ml LV vol s, MOD A4C: 112.0 ml LV SV MOD A2C:     48.0 ml LV SV MOD A4C:     142.0 ml LV SV MOD BP:      41.1 ml RIGHT VENTRICLE            IVC RV Basal diam:  3.80 cm    IVC diam: 2.50 cm RV S prime:     9.79 cm/s TAPSE (M-mode): 1.4 cm LEFT ATRIUM              Index        RIGHT ATRIUM           Index LA diam:        4.80 cm  2.35 cm/m   RA Area:     22.80 cm LA Vol (A2C):   112.0 ml 54.80 ml/m  RA Volume:   65.20 ml  31.90 ml/m LA Vol (A4C):   95.9 ml  46.92 ml/m LA Biplane Vol: 104.0 ml 50.88 ml/m  AORTIC VALVE LVOT Vmax:   56.30 cm/s LVOT Vmean:  42.500 cm/s LVOT VTI:    0.100 m  AORTA Ao Root diam: 3.40 cm Ao Asc diam:  3.70 cm MITRAL VALVE                  TRICUSPID VALVE MV Area (PHT): 4.74 cm       TR Peak grad:   34.1 mmHg MV Decel Time: 160 msec       TR Vmax:         292.00 cm/s MR Peak grad:    90.6 mmHg MR Mean grad:    57.0 mmHg    SHUNTS MR Vmax:         476.00 cm/s  Systemic VTI:  0.10 m MR Vmean:        346.0 cm/s   Systemic Diam: 2.20 cm MR PISA:         1.57 cm MR PISA Eff  ROA: 11 mm MR PISA Radius:  0.50 cm MV E velocity: 103.00 cm/s Arvilla Meres MD Electronically signed by Arvilla Meres MD Signature Date/Time: 10/21/2022/4:16:02 PM    Final (Updated)    ECHOCARDIOGRAM LIMITED  Result Date: 10/25/2022    ECHOCARDIOGRAM LIMITED REPORT   Patient Name:   Zachary Nolan Date of Exam: 09/26/2022 Medical Rec #:  [id number]  Height:       69.0 in Accession #:    [id number] Weight:       195.0 lb Date of Birth:  05-Jun-1941  BSA:          2.044 m Patient Age:    82 years   BP:           143/92 mmHg Patient Gender: M          HR:           85 bpm. Exam Location:  Inpatient Procedure: 2D Echo, Cardiac Doppler and Color Doppler Indications:    Atrial fibrillation  History:        Patient has prior history of Echocardiogram examinations.  Sonographer:    NA Referring Phys: [id number] DANIEL R BENSIMHON IMPRESSIONS  1. Left ventricular ejection fraction, by estimation, is 25 to 30%. The left ventricle has severely decreased function. The left ventricle demonstrates global hypokinesis. Left ventricular diastolic parameters are indeterminate.  2. Right ventricular systolic function is mildly reduced. The right ventricular size is normal.  3. The mitral valve is normal in structure. Trivial mitral valve regurgitation. No evidence of mitral stenosis.  4. The aortic valve is tricuspid. There is severe calcifcation of the aortic valve. Aortic valve regurgitation is not visualized. Low flow/low gradient moderate aortic valve stenosis. Aortic valve area, by VTI measures 1.38 cm. Aortic valve mean gradient measures 16.0 mmHg.  5. Aortic dilatation noted. There is mild dilatation of the ascending aorta, measuring 38 mm.  6. IVC not visualized.  7. The patient is in atrial fibrillation.   8. Limited echo FINDINGS  Left Ventricle: Left ventricular ejection fraction, by estimation, is 25 to 30%. The left ventricle has severely decreased function. The left ventricle demonstrates global hypokinesis. Left ventricular diastolic parameters are indeterminate. Right Ventricle: The right ventricular size is normal. Right ventricular systolic function is mildly reduced. Pericardium: Trivial pericardial effusion is present. Mitral Valve: The mitral valve is normal in structure. Trivial mitral valve regurgitation. No evidence of mitral valve stenosis. Tricuspid Valve: The tricuspid valve is normal in structure. Tricuspid valve regurgitation is not demonstrated. Aortic Valve: The aortic valve is tricuspid. There is severe calcifcation of the aortic valve. Aortic valve regurgitation is not visualized. Moderate aortic stenosis is present. Aortic valve mean gradient measures 16.0 mmHg. Aortic valve peak gradient measures 21.9 mmHg. Aortic valve area, by VTI measures 1.38 cm. Pulmonic Valve: The pulmonic valve was normal in structure. Pulmonic valve regurgitation is trivial. Aorta: Aortic dilatation noted. There is mild dilatation of the ascending aorta, measuring 38 mm. LEFT VENTRICLE PLAX 2D LVOT diam:     2.30 cm LV SV:         52 LV SV Index:   25 LVOT Area:     4.15 cm  AORTIC VALVE AV Area (Vmax):    1.30 cm AV Area (Vmean):   1.31 cm AV Area (VTI):     1.38 cm AV Vmax:           234.00 cm/s AV Vmean:          180.667 cm/s AV VTI:  0.376 m AV Peak Grad:      21.9 mmHg AV Mean Grad:      16.0 mmHg LVOT Vmax:         73.45 cm/s LVOT Vmean:        56.750 cm/s LVOT VTI:          0.125 m LVOT/AV VTI ratio: 0.33  SHUNTS Systemic VTI:  0.12 m Systemic Diam: 2.30 cm Dalton McleanMD Electronically signed by Wilfred Lacy Signature Date/Time: 10/16/2022/5:22:23 PM    Final    CT Angio Chest PE W and/or Wo Contrast  Result Date: 10/10/2022 CLINICAL DATA:  Shortness of breath EXAM: CT ANGIOGRAPHY CHEST  WITH CONTRAST TECHNIQUE: Multidetector CT imaging of the chest was performed using the standard protocol during bolus administration of intravenous contrast. Multiplanar CT image reconstructions and MIPs were obtained to evaluate the vascular anatomy. RADIATION DOSE REDUCTION: This exam was performed according to the departmental dose-optimization program which includes automated exposure control, adjustment of the mA and/or kV according to patient size and/or use of iterative reconstruction technique. CONTRAST:  60mL OMNIPAQUE IOHEXOL 350 MG/ML SOLN COMPARISON:  Chest radiographs done earlier today FINDINGS: Cardiovascular: Heart is enlarged in size. Extensive coronary artery calcifications are seen. RV LV ratio is 1.2. Contrast enhancement in the thoracic aorta is less than adequate to evaluate the lumen. There are intraluminal filling defects seen segmental and subsegmental pulmonary artery branches in right lower lobe. Mediastinum/Nodes: There are subcentimeter nodes in mediastinum and hilar regions. There is prominent pericardial recess. Lungs/Pleura: There are linear patchy infiltrates in right middle lobe and right lower lobe. Small right pleural effusion is seen. Upper Abdomen: Gallbladder stones are seen. Musculoskeletal: There is decrease in height of the bodies of T4 and T11 vertebrae without demonstrable break in the cortical margins. Findings may suggest old compression fractures. Review of the MIP images confirms the above findings. IMPRESSION: There are intraluminal filling defects in segmental and subsegmental branches in right lower lobe with small thrombus burden. RV LV ratio is 1.2 which may be related to chronic right heart strain. Coronary artery disease. Cardiomegaly. Small right pleural effusion. There are patchy infiltrates in right middle lobe and right lower lobe suggesting atelectasis/pneumonia. There is decrease in height of the bodies of T4 and T11 vertebrae without demonstrable breaking  the cortical margins suggesting possible old compression fractures. Gallbladder stones. Electronically Signed   By: Ernie Avena M.D.   On: 09/30/2022 12:15   CT ABDOMEN PELVIS W CONTRAST  Result Date: 10/15/2022 CLINICAL DATA:  Abdominal pain EXAM: CT ABDOMEN AND PELVIS WITH CONTRAST TECHNIQUE: Multidetector CT imaging of the abdomen and pelvis was performed using the standard protocol following bolus administration of intravenous contrast. RADIATION DOSE REDUCTION: This exam was performed according to the departmental dose-optimization program which includes automated exposure control, adjustment of the mA and/or kV according to patient size and/or use of iterative reconstruction technique. CONTRAST:  60mL OMNIPAQUE IOHEXOL 350 MG/ML SOLN COMPARISON:  None Available. FINDINGS: Lower chest: Small right pleural effusion is seen. Patchy infiltrates are seen in right lower lung field. Coronary artery calcifications are seen. Hepatobiliary: There are multiple calcified gallbladder stones. There is no dilation of bile ducts. Pancreas: No focal abnormalities are seen. Spleen: Unremarkable. Adrenals/Urinary Tract: Adrenals are unremarkable. There is no hydronephrosis. There are no renal or ureteral stones. Urinary bladder is unremarkable. Stomach/Bowel: Stomach is not distended. Small bowel loops are not dilated. Appendix is not dilated. There is no significant wall thickening in colon. Scattered diverticula seen in  colon without signs of focal diverticulitis. Vascular/Lymphatic: Calcifications are seen in aorta and its major branches. Reproductive: Prostate is enlarged. Other: There is no ascites or pneumoperitoneum. Small paraumbilical hernia containing fat is seen. Musculoskeletal: Degenerative changes are noted with disc space narrowing, bony spurs and encroachment of neural foramina at multiple levels. Degenerative changes are noted in both hips with joint space narrowing, bony spurs and subcortical cysts.  Possible old bone infarct is seen in the shaft of left femur. IMPRESSION: There is no evidence of intestinal obstruction or pneumoperitoneum. There is no hydronephrosis. Appendix is unremarkable8. Gallbladder stones. Scattered diverticula are seen in colon without signs of focal diverticulitis. Enlarged prostate. Small right pleural effusion. Patchy infiltrates in the right lower lung field may suggest atelectasis/pneumonia. Coronary artery disease. Arteriosclerosis. Lumbar spondylosis. Marked degenerative changes are noted in both hips. Electronically Signed   By: Ernie Avena M.D.   On: 10/25/2022 11:59   DG Chest 2 View  Result Date: 10/10/2022 CLINICAL DATA:  Shortness of breath. EXAM: CHEST - 2 VIEW COMPARISON:  Chest radiograph 11/08/2021 and earlier FINDINGS: Borderline enlarged cardiac silhouette. Mild interstitial opacities are noted at the right lung base. Left lung is clear. No pleural effusion or pneumothorax. Stable chronic compression fracture in the distal thoracic spine. IMPRESSION: Mild interstitial opacities at the right lung base may represent subsegmental atelectasis, aspiration changes, or infection in the appropriate clinical context. Electronically Signed   By: Sherron Ales M.D.   On: 10/06/2022 09:57           LOS: 1 day   Time spent= 35 mins    Paullette Mckain Joline Maxcy, MD [facility]  If 7PM-7AM, please contact night-coverage  10/18/2022, 7:30 AM

## 2022-10-18 NOTE — Progress Notes (Signed)
Patient admitted for heart failure, new afib, aortic stenosis, and  acute PE, Not a candidate for advanced therapies. Patient will discuss code status with wife. Palliative team has been consulted for GOC.  TOC following.

## 2022-10-18 NOTE — Progress Notes (Signed)
ANTICOAGULATION CONSULT NOTE  Pharmacy Consult for Heparin Indication: atrial fibrillation  Allergies  Allergen Reactions   Dust Mite Extract     Stuffy head   Pollen Extract     Stuffy head   Shellfish-Derived Products Swelling    JUST CRAB MEAT   Statins Other (See Comments)    Dizziness and myalgias    Patient Measurements: Height:  (175.3 cm) Weight: 90 kg (198 lb 6.6 oz) IBW/kg (Calculated) : 70.7 Heparin Dosing Weight: 88 kg  Vital Signs: Temp: 97.8 F (36.6 C) (04/23 0818) Temp Source: Oral (04/23 0818) BP: 118/82 (04/23 0818) Pulse Rate: 100 (04/23 0818)  Labs: Recent Labs    10/03/2022 0850 10/07/2022 1002 09/28/2022 2315 10/18/22 0642  HGB 11.3*  --   --  10.9*  HCT 34.6*  --   --  33.7*  PLT 201  --   --  226  HEPARINUNFRC  --   --  0.37 0.22*  CREATININE 1.63*  --   --  1.77*  TROPONINIHS 241* 212*  --   --      Estimated Creatinine Clearance: 35.1 mL/min (A) (by C-G formula based on SCr of 1.77 mg/dL (H)).   Medical History: Past Medical History:  Diagnosis Date   CAD (coronary artery disease)    a. LHC (7/11):  Inf STEMI >>> inf AK, EF 35-40%, LAD 70-75%, mid CFX 70%, dist RCA 99% >>> PCI:  3.5 x 28 mm Promus DES to RCA;     Cancer    skin   Depression    Diabetes mellitus    Non-insulin-dependent diabetes mellitus.    DJD (degenerative joint disease)    HTN (hypertension)    Hx of cardiovascular stress test    a. Nuclear (3/14):  Small inf defect - likely scar; no ischemia, EF 59%; LOW RISK;  b. Lexiscan Myoview (1/16): No ischemia, fixed inferior defect consistent with prior infarct versus diaphragmatic attenuation, EF 57%, Low Risk   Hyperlipemia    Ischemic cardiomyopathy    EF 35-40% at time of MI in 2011 >> improved to normal on Nuclear study in 2014   OSA (obstructive sleep apnea) 09/10/2014   severe with AHI 68/hr   RLS (restless legs syndrome)    Seasonal allergies     Assessment: 82 yo M presents with weakness and found to  be in atrial fibrillation, not on anticoagulation PTA. CT PE also revealed small PE in segmental and subsegmental branches of RLL. Pharmacy consulted to dose heparin for acute PE and new-onset AFib.  Heparin level 0.22, subtherapeutic Current heparin infusion rate: 1350 units/hr  Hgb 10.9, Plt 226 - stable No s/sx of bleeding reported by RN. Despite agitation overnight, no issues with heparin infusion reported.  Goal of Therapy:  Heparin level 0.3-0.7 units/ml Monitor platelets by anticoagulation protocol: Yes  Plan:  Re-bolus 1300 units IV heparin from infusion x 1, then Increase heparin infusion rate to 1500units/hr Check heparin level in 8 hours Monitor daily CBC, heparin level, and for s/sx of bleeding   Wilburn Cornelia, PharmD, BCPS Clinical Pharmacist 10/18/2022 10:01 AM   Please refer to AMION for pharmacy phone number

## 2022-10-18 NOTE — ED Notes (Signed)
Pt placed on 2lpm via [place] to keep oxygen level 90% or above

## 2022-10-18 NOTE — Plan of Care (Signed)
  Problem: Education: Goal: Ability to describe self-care measures that may prevent or decrease complications (Diabetes Survival Skills Education) will improve Outcome: Progressing Goal: Individualized Educational Video(s) Outcome: Progressing   Problem: Coping: Goal: Ability to adjust to condition or change in health will improve Outcome: Progressing   Problem: Fluid Volume: Goal: Ability to maintain a balanced intake and output will improve Outcome: Progressing   Problem: Health Behavior/Discharge Planning: Goal: Ability to identify and utilize available resources and services will improve Outcome: Progressing Goal: Ability to manage health-related needs will improve Outcome: Progressing   Problem: Metabolic: Goal: Ability to maintain appropriate glucose levels will improve Outcome: Progressing   Problem: Nutritional: Goal: Maintenance of adequate nutrition will improve Outcome: Progressing Goal: Progress toward achieving an optimal weight will improve Outcome: Progressing   Problem: Skin Integrity: Goal: Risk for impaired skin integrity will decrease Outcome: Progressing   Problem: Tissue Perfusion: Goal: Adequacy of tissue perfusion will improve Outcome: Progressing   Problem: Safety: Goal: Non-violent Restraint(s) Outcome: Progressing   Problem: Education: Goal: Knowledge of General Education information will improve Description: Including pain rating scale, medication(s)/side effects and non-pharmacologic comfort measures Outcome: Progressing   Problem: Health Behavior/Discharge Planning: Goal: Ability to manage health-related needs will improve Outcome: Progressing   Problem: Clinical Measurements: Goal: Ability to maintain clinical measurements within normal limits will improve Outcome: Progressing Goal: Will remain free from infection Outcome: Progressing Goal: Diagnostic test results will improve Outcome: Progressing Goal: Respiratory complications  will improve Outcome: Progressing Goal: Cardiovascular complication will be avoided Outcome: Progressing   Problem: Activity: Goal: Risk for activity intolerance will decrease Outcome: Progressing   Problem: Nutrition: Goal: Adequate nutrition will be maintained Outcome: Progressing   Problem: Coping: Goal: Level of anxiety will decrease Outcome: Progressing   Problem: Elimination: Goal: Will not experience complications related to bowel motility Outcome: Progressing Goal: Will not experience complications related to urinary retention Outcome: Progressing   Problem: Pain Managment: Goal: General experience of comfort will improve Outcome: Progressing   Problem: Safety: Goal: Ability to remain free from injury will improve Outcome: Progressing   Problem: Skin Integrity: Goal: Risk for impaired skin integrity will decrease Outcome: Progressing   

## 2022-10-18 NOTE — ED Notes (Signed)
Pt agitated and uncomfortable. Pt seems confused which is new. Dr. Bevely Palmer aware of mental status changes. New order for melatonin

## 2022-10-18 NOTE — Progress Notes (Addendum)
Received a call from bedside RN Adline Potter, regarding the patient being agitated and pulling at his IV.  Bedside RN requesting medication for his agitation, "maybe a little dose of Morphine IV".  Reviewed the patient's chart.  High risk patient.  New onset Afib, acute PE, acute on chronic HFpEF, CAD with elevated troponin, CAP, uncontrolled DM2, OSA.  Instead of ordering Morphine or other sedative agents as suggested by RN, ordered Melatonin as needed for sleep, to regulate sleep and awake cycle, as part of delirium precautions.    Presented at bedside.  The patient is calm and asleep.  Arouses to verbal stimuli.  No sign of agitation.  When asked if he is hurting he responds "no, I'm not hurting".  I would recommend to avoid sedative agents in a patient who is already lethargic.  We will continue to closely monitor and treat as indicated.  Time: 15 minutes.

## 2022-10-18 NOTE — Progress Notes (Signed)
Bilateral lower extremity venous duplex has been completed. Preliminary results can be found in CV Proc through chart review.  Results were given to the patient's nurse, Elliot Gurney.  10/18/22 2:07 PM Olen Cordial RVT

## 2022-10-18 NOTE — Progress Notes (Signed)
Heart Failure Navigator Progress Note  Assessed for Heart & Vascular TOC clinic readiness.  Patient does not meet criteria due to Advanced Heart Failure Team consult. .   Navigator will sign off at this time.    Falcon Mccaskey, BSN, RN Heart Failure Nurse Navigator Secure Chat Only   

## 2022-10-19 ENCOUNTER — Inpatient Hospital Stay (HOSPITAL_COMMUNITY): Payer: Medicare HMO | Admitting: Certified Registered Nurse Anesthetist

## 2022-10-19 ENCOUNTER — Inpatient Hospital Stay (HOSPITAL_COMMUNITY): Payer: Medicare HMO

## 2022-10-19 DIAGNOSIS — I4891 Unspecified atrial fibrillation: Secondary | ICD-10-CM | POA: Diagnosis not present

## 2022-10-19 DIAGNOSIS — R092 Respiratory arrest: Secondary | ICD-10-CM

## 2022-10-19 DIAGNOSIS — Z66 Do not resuscitate: Secondary | ICD-10-CM

## 2022-10-19 DIAGNOSIS — J9601 Acute respiratory failure with hypoxia: Secondary | ICD-10-CM | POA: Diagnosis not present

## 2022-10-19 DIAGNOSIS — I5082 Biventricular heart failure: Secondary | ICD-10-CM

## 2022-10-19 DIAGNOSIS — Z9911 Dependence on respirator [ventilator] status: Secondary | ICD-10-CM

## 2022-10-19 LAB — BLOOD GAS, ARTERIAL
Acid-base deficit: 7.5 mmol/L — ABNORMAL HIGH (ref 0.0–2.0)
Acid-base deficit: 5.6 mmol/L — ABNORMAL HIGH (ref 0.0–2.0)
Bicarbonate: 18.8 mmol/L — ABNORMAL LOW (ref 20.0–28.0)
Bicarbonate: 21.1 mmol/L (ref 20.0–28.0)
Drawn by: 53547
O2 Saturation: 97.3 %
O2 Saturation: 95.6 %
Patient temperature: 36.9
Patient temperature: 36.8
pCO2 arterial: 40 mmHg (ref 32–48)
pCO2 arterial: 45 mmHg (ref 32–48)
pH, Arterial: 7.28 — ABNORMAL LOW (ref 7.35–7.45)
pH, Arterial: 7.28 — ABNORMAL LOW (ref 7.35–7.45)
pO2, Arterial: 80 mmHg — ABNORMAL LOW (ref 83–108)
pO2, Arterial: 72 mmHg — ABNORMAL LOW (ref 83–108)

## 2022-10-19 LAB — CBC
HCT: 32.8 % — ABNORMAL LOW (ref 39.0–52.0)
HCT: 30.7 % — ABNORMAL LOW (ref 39.0–52.0)
Hemoglobin: 10.2 g/dL — ABNORMAL LOW (ref 13.0–17.0)
Hemoglobin: 9.5 g/dL — ABNORMAL LOW (ref 13.0–17.0)
MCH: 28.2 pg (ref 26.0–34.0)
MCH: 28.3 pg (ref 26.0–34.0)
MCHC: 31.1 g/dL (ref 30.0–36.0)
MCHC: 30.9 g/dL (ref 30.0–36.0)
MCV: 90.6 fL (ref 80.0–100.0)
MCV: 91.4 fL (ref 80.0–100.0)
Platelets: 232 10*3/uL (ref 150–400)
Platelets: 227 10*3/uL (ref 150–400)
RBC: 3.62 MIL/uL — ABNORMAL LOW (ref 4.22–5.81)
RBC: 3.36 MIL/uL — ABNORMAL LOW (ref 4.22–5.81)
RDW: 13.1 % (ref 11.5–15.5)
RDW: 13 % (ref 11.5–15.5)
WBC: 7.8 10*3/uL (ref 4.0–10.5)
WBC: 8.3 10*3/uL (ref 4.0–10.5)
nRBC: 0 % (ref 0.0–0.2)
nRBC: 0 % (ref 0.0–0.2)

## 2022-10-19 LAB — BASIC METABOLIC PANEL
Anion gap: 12 (ref 5–15)
Anion gap: 15 (ref 5–15)
BUN: 42 mg/dL — ABNORMAL HIGH (ref 8–23)
BUN: 45 mg/dL — ABNORMAL HIGH (ref 8–23)
CO2: 20 mmol/L — ABNORMAL LOW (ref 22–32)
CO2: 19 mmol/L — ABNORMAL LOW (ref 22–32)
Calcium: 9.2 mg/dL (ref 8.9–10.3)
Calcium: 8.9 mg/dL (ref 8.9–10.3)
Chloride: 101 mmol/L (ref 98–111)
Chloride: 100 mmol/L (ref 98–111)
Creatinine, Ser: 1.94 mg/dL — ABNORMAL HIGH (ref 0.61–1.24)
Creatinine, Ser: 2.4 mg/dL — ABNORMAL HIGH (ref 0.61–1.24)
GFR, Estimated: 34 mL/min — ABNORMAL LOW (ref >60)
GFR, Estimated: 26 mL/min — ABNORMAL LOW (ref >60)
Glucose, Bld: 210 mg/dL — ABNORMAL HIGH (ref 70–99)
Glucose, Bld: 332 mg/dL — ABNORMAL HIGH (ref 70–99)
Potassium: 3.7 mmol/L (ref 3.5–5.1)
Potassium: 3.8 mmol/L (ref 3.5–5.1)
Sodium: 133 mmol/L — ABNORMAL LOW (ref 135–145)
Sodium: 134 mmol/L — ABNORMAL LOW (ref 135–145)

## 2022-10-19 LAB — CBC WITH DIFFERENTIAL/PLATELET
Abs Immature Granulocytes: 0.34 10*3/uL — ABNORMAL HIGH (ref 0.00–0.07)
Basophils Absolute: 0.1 10*3/uL (ref 0.0–0.1)
Basophils Relative: 1 %
Eosinophils Absolute: 0 10*3/uL (ref 0.0–0.5)
Eosinophils Relative: 0 %
HCT: 32.3 % — ABNORMAL LOW (ref 39.0–52.0)
Hemoglobin: 10.3 g/dL — ABNORMAL LOW (ref 13.0–17.0)
Immature Granulocytes: 7 %
Lymphocytes Relative: 19 %
Lymphs Abs: 1 10*3/uL (ref 0.7–4.0)
MCH: 28.6 pg (ref 26.0–34.0)
MCHC: 31.9 g/dL (ref 30.0–36.0)
MCV: 89.7 fL (ref 80.0–100.0)
Monocytes Absolute: 0.3 10*3/uL (ref 0.1–1.0)
Monocytes Relative: 5 %
Neutro Abs: 3.6 10*3/uL (ref 1.7–7.7)
Neutrophils Relative %: 68 %
Platelets: 248 10*3/uL (ref 150–400)
RBC: 3.6 MIL/uL — ABNORMAL LOW (ref 4.22–5.81)
RDW: 12.9 % (ref 11.5–15.5)
WBC: 5.2 10*3/uL (ref 4.0–10.5)
nRBC: 0.4 % — ABNORMAL HIGH (ref 0.0–0.2)

## 2022-10-19 LAB — URINALYSIS, W/ REFLEX TO CULTURE (INFECTION SUSPECTED)
Bilirubin Urine: NEGATIVE
Glucose, UA: NEGATIVE mg/dL
Ketones, ur: NEGATIVE mg/dL
Nitrite: NEGATIVE
Protein, ur: 100 mg/dL — AB
RBC / HPF: >50 RBC/hpf (ref 0–5)
Specific Gravity, Urine: 1.017 (ref 1.005–1.030)
pH: 5 (ref 5.0–8.0)

## 2022-10-19 LAB — COMPREHENSIVE METABOLIC PANEL
ALT: 14 U/L (ref 0–44)
AST: 17 U/L (ref 15–41)
Albumin: 3.1 g/dL — ABNORMAL LOW (ref 3.5–5.0)
Alkaline Phosphatase: 70 U/L (ref 38–126)
Anion gap: 17 — ABNORMAL HIGH (ref 5–15)
BUN: 42 mg/dL — ABNORMAL HIGH (ref 8–23)
CO2: 18 mmol/L — ABNORMAL LOW (ref 22–32)
Calcium: 8.3 mg/dL — ABNORMAL LOW (ref 8.9–10.3)
Chloride: 93 mmol/L — ABNORMAL LOW (ref 98–111)
Creatinine, Ser: 2.29 mg/dL — ABNORMAL HIGH (ref 0.61–1.24)
GFR, Estimated: 28 mL/min — ABNORMAL LOW (ref >60)
Glucose, Bld: 529 mg/dL (ref 70–99)
Potassium: 3.6 mmol/L (ref 3.5–5.1)
Sodium: 128 mmol/L — ABNORMAL LOW (ref 135–145)
Total Bilirubin: 1 mg/dL (ref 0.3–1.2)
Total Protein: 6.1 g/dL — ABNORMAL LOW (ref 6.5–8.1)

## 2022-10-19 LAB — COOXEMETRY PANEL
Carboxyhemoglobin: 2.1 % — ABNORMAL HIGH (ref 0.5–1.5)
Carboxyhemoglobin: 1.7 % — ABNORMAL HIGH (ref 0.5–1.5)
Methemoglobin: <0.7 % (ref 0.0–1.5)
Methemoglobin: <0.7 % (ref 0.0–1.5)
O2 Saturation: 69.1 %
O2 Saturation: 69.6 %
Total hemoglobin: 10.3 g/dL — ABNORMAL LOW (ref 12.0–16.0)
Total hemoglobin: 9.6 g/dL — ABNORMAL LOW (ref 12.0–16.0)

## 2022-10-19 LAB — GLUCOSE, CAPILLARY
Glucose-Capillary: 219 mg/dL — ABNORMAL HIGH (ref 70–99)
Glucose-Capillary: 175 mg/dL — ABNORMAL HIGH (ref 70–99)
Glucose-Capillary: 220 mg/dL — ABNORMAL HIGH (ref 70–99)
Glucose-Capillary: 213 mg/dL — ABNORMAL HIGH (ref 70–99)
Glucose-Capillary: 223 mg/dL — ABNORMAL HIGH (ref 70–99)
Glucose-Capillary: 213 mg/dL — ABNORMAL HIGH (ref 70–99)

## 2022-10-19 LAB — CULTURE, BLOOD (ROUTINE X 2)

## 2022-10-19 LAB — AMMONIA: Ammonia: 18 umol/L (ref 9–35)

## 2022-10-19 LAB — MAGNESIUM: Magnesium: 2.1 mg/dL (ref 1.7–2.4)

## 2022-10-19 LAB — LACTIC ACID, PLASMA: Lactic Acid, Venous: 1.2 mmol/L (ref 0.5–1.9)

## 2022-10-19 LAB — HEPARIN LEVEL (UNFRACTIONATED): Heparin Unfractionated: 0.31 IU/mL (ref 0.30–0.70)

## 2022-10-19 MED ORDER — DOCUSATE SODIUM 50 MG/5ML PO LIQD: 100.0000 mg | Freq: Two times a day (BID) | ORAL | Status: DC

## 2022-10-19 MED ORDER — FENTANYL CITRATE PF 50 MCG/ML IJ SOSY: 25.0000 ug | PREFILLED_SYRINGE | INTRAMUSCULAR | Status: DC | PRN

## 2022-10-19 MED ORDER — MIDAZOLAM HCL 2 MG/2ML IJ SOLN
1.0000 mg | INTRAMUSCULAR | Status: DC | PRN
  Administered 2022-10-20: 2 mg via INTRAVENOUS
  Filled 2022-10-19 (×2): qty 2

## 2022-10-19 MED ORDER — POTASSIUM CHLORIDE CRYS ER 20 MEQ PO TBCR: 40.0000 meq | EXTENDED_RELEASE_TABLET | Freq: Once | ORAL | Status: DC

## 2022-10-19 MED ORDER — STERILE WATER FOR INJECTION IJ SOLN
INTRAMUSCULAR | Status: AC
  Administered 2022-10-19: 10 mL
  Filled 2022-10-19: qty 10

## 2022-10-19 MED ORDER — POTASSIUM CHLORIDE 20 MEQ PO PACK: 40.0000 meq | PACK | Freq: Once | ORAL | Status: DC

## 2022-10-19 MED ORDER — FENTANYL CITRATE PF 50 MCG/ML IJ SOSY
25.0000 ug | PREFILLED_SYRINGE | INTRAMUSCULAR | Status: DC | PRN
  Administered 2022-10-19: 100 ug via INTRAVENOUS
  Administered 2022-10-19 – 2022-10-20 (×2): 50 ug via INTRAVENOUS
  Administered 2022-10-20 (×6): 100 ug via INTRAVENOUS
  Administered 2022-10-20: 50 ug via INTRAVENOUS
  Filled 2022-10-19: qty 2
  Filled 2022-10-19: qty 1
  Filled 2022-10-19 (×8): qty 2

## 2022-10-19 MED ORDER — POLYETHYLENE GLYCOL 3350 17 G PO PACK: 17.0000 g | PACK | Freq: Every day | ORAL | Status: DC

## 2022-10-19 MED ORDER — POTASSIUM CHLORIDE 10 MEQ/100ML IV SOLN
10.0000 meq | INTRAVENOUS | Status: DC
  Administered 2022-10-19: 10 meq via INTRAVENOUS
  Filled 2022-10-19 (×2): qty 100

## 2022-10-19 MED ORDER — FUROSEMIDE 10 MG/ML IJ SOLN
12.0000 mg/h | INTRAVENOUS | Status: DC
  Administered 2022-10-19 – 2022-10-20 (×2): 12 mg/h via INTRAVENOUS
  Filled 2022-10-19 (×2): qty 20

## 2022-10-19 MED ORDER — INSULIN GLARGINE-YFGN 100 UNIT/ML ~~LOC~~ SOLN
15.0000 [IU] | Freq: Every day | SUBCUTANEOUS | Status: DC
  Administered 2022-10-19: 15 [IU] via SUBCUTANEOUS
  Filled 2022-10-19 (×2): qty 0.15

## 2022-10-19 MED ORDER — POTASSIUM CHLORIDE 10 MEQ/100ML IV SOLN
10.0000 meq | INTRAVENOUS | Status: AC
  Administered 2022-10-19 (×4): 10 meq via INTRAVENOUS
  Filled 2022-10-19 (×3): qty 100

## 2022-10-19 NOTE — Progress Notes (Signed)
Palliative:  Chart reviewed extensively.  Spoke with wife via phone.  Introduced palliative care.  Goals of care meeting scheduled 4/25 Thurs at 1 pm.  Gerlean Ren, DNP, [facility] Palliative Medicine Team Team Phone # [phone]  Pager # [phone]  NO CHARGE

## 2022-10-19 NOTE — Progress Notes (Signed)
ANTICOAGULATION CONSULT NOTE - Follow Up Consult  Pharmacy Consult for Heparin Indication: atrial fibrillation and pulmonary embolus  Allergies  Allergen Reactions   Shellfish-Derived Products Swelling    JUST CRAB MEAT   Statins Other (See Comments)    Dizziness and myalgias    Patient Measurements: Height:  (175.3 cm) Weight: 88.3 kg (194 lb 10.7 oz) IBW/kg (Calculated) : 70.7 Heparin Dosing Weight: 88 kg  Vital Signs: Temp: 98.6 F (37 C) (04/24 0712) Temp Source: Oral (04/24 0712) BP: 134/116 (04/24 0712) Pulse Rate: 122 (04/24 0712)  Labs: Recent Labs    09/30/2022 0850 10/11/2022 1002 09/27/2022 2315 10/18/22 0642 10/18/22 2101 10/19/22 0424  HGB 11.3*  --   --  10.9*  --  10.2*  HCT 34.6*  --   --  33.7*  --  32.8*  PLT 201  --   --  226  --  232  HEPARINUNFRC  --   --    < > 0.22* 0.45 0.31  CREATININE 1.63*  --   --  1.77*  --  1.94*  TROPONINIHS 241* 212*  --   --   --   --    < > = values in this interval not displayed.     Estimated Creatinine Clearance: 31.7 mL/min (A) (by C-G formula based on SCr of 1.94 mg/dL (H)).  Assessment: 82 yo M presents with weakness and found to be in atrial fibrillation, not on anticoagulation PTA. CT PE also revealed small PE in segmental and subsegmental branches of RLL. Pharmacy consulted to dose heparin for acute PE and new-onset AFib.   Heparin level is therapeutic (0.31) on 1500 units/hr. Hgb 10.2, plt 232 (stable). No s/sx of bleeding or infusion issues.   Goal of Therapy:  Heparin level 0.3-0.7 units/ml Monitor platelets by anticoagulation protocol: Yes   Plan:  Continue heparin drip at 1500 units/hr Next heparin level and CBC in am. Monitor for signs/symptoms of bleeding.  Thank you for allowing pharmacy to participate in this patient's care,  Sherron Monday, PharmD, BCCCP Clinical Pharmacist  Phone: [phone] 10/19/2022 7:29 AM  Please check AMION for all [facility] Pharmacy phone numbers After 10:00 PM,  call Main Pharmacy [phone]

## 2022-10-19 NOTE — Anesthesia Procedure Notes (Signed)
Procedure Name: Intubation Date/Time: 10/19/2022 10:24 PM  Performed by: Tressia Miners, CRNAPre-anesthesia Checklist: Patient identified, Emergency Drugs available, Suction available, Patient being monitored and Timeout performed Patient Re-evaluated:Patient Re-evaluated prior to induction Oxygen Delivery Method: Ambu bag Preoxygenation: Pre-oxygenation with 100% oxygen Induction Type: Cricoid Pressure applied Ventilation: Mask ventilation without difficulty Laryngoscope Size: Glidescope and 4 Grade View: Grade I Tube type: Oral Tube size: 7.5 mm Number of attempts: 2 Airway Equipment and Method: Stylet and Video-laryngoscopy Placement Confirmation: ETT inserted through vocal cords under direct vision, breath sounds checked- equal and bilateral and CO2 detector Secured at: 24 cm Tube secured with: Tape Dental Injury: Teeth and Oropharynx as per pre-operative assessment  Comments: CRNA arrived to Code Blue, CPR in progress and Respiratory Therapy at head of bed attempting to intubate. CRNA intubated with Gd 1 view with Glidescope 4 blade. + CO2 detector and +BBS. ETT secured with tube holder.

## 2022-10-19 NOTE — Progress Notes (Signed)
MEWS Progress Note  Patient Details Name: Zachary Nolan MRN: [id number] DOB: Jul 04, 1940 Today's Date: 10/19/2022   MEWS Flowsheet Documentation:  Assess: MEWS Score Temp: 98.6 F (37 C) BP: (!) 134/116 MAP (mmHg): 123 Pulse Rate: (!) 122 ECG Heart Rate: (!) 127 Resp: (!) 29 Level of Consciousness: Responds to Voice SpO2: 90 % O2 Device: Nasal Cannula Patient Activity (if Appropriate): In bed O2 Flow Rate (L/min): 4 L/min Assess: MEWS Score MEWS Temp: 0 MEWS Systolic: 0 MEWS Pulse: 2 MEWS RR: 2 MEWS LOC: 1 MEWS Score: 5 MEWS Score Color: Red Assess: SIRS CRITERIA SIRS Temperature : 0 SIRS Respirations : 1 SIRS Pulse: 1 SIRS WBC: 0 SIRS Score Sum : 2 Assess: if the MEWS score is Yellow or Red Were vital signs taken at a resting state?: Yes Focused Assessment: No change from prior assessment Does the patient meet 2 or more of the SIRS criteria?: Yes Does the patient have a confirmed or suspected source of infection?: No Provider and Rapid Response Notified?: Yes MEWS guidelines implemented : Yes, red Treat MEWS Interventions: Considered administering scheduled or prn medications/treatments as ordered Take Vital Signs Increase Vital Sign Frequency : Red: Q1hr x2, continue Q4hrs until patient remains green for 12hrs Escalate MEWS: Escalate: Red: Discuss with charge nurse and notify provider. Consider notifying RRT. If remains red for 2 hours consider need for higher level of care Provider Notification Provider Name/Title: Dr. Jomarie Longs / Attending, Boyce Medici PA -Cardiology Date Provider Notified: 10/19/22 Time Provider Notified: 0730 Method of Notification: Face-to-face Notification Reason: Other (Comment) (Red MEWS) Test performed and critical result: Troponin 241 Date Critical Result Received: 09/30/2022 Time Critical Result Received: 8295 Provider response: At bedside, In department Date of Provider Response: 10/19/22 Time of Provider Response: 0732  Kallie Locks 10/19/2022, 7:33 AM

## 2022-10-19 NOTE — Progress Notes (Signed)
MEWS Progress Note  Patient Details Name: Zachary Nolan MRN: [id number] DOB: 26-May-1941 Today's Date: 10/19/2022  MEWS Flowsheet Documentation:  Assess: MEWS Score Temp: 98.4 F (36.9 C) BP: 111/78 MAP (mmHg): 90 Pulse Rate: (!) 113 ECG Heart Rate: (!) 123 Resp: (!) 23 Level of Consciousness: Responds to Voice SpO2: 95 % O2 Device: Nasal Cannula Patient Activity (if Appropriate): In bed O2 Flow Rate (L/min): 4 L/min Assess: MEWS Score MEWS Temp: 0 MEWS Systolic: 0 MEWS Pulse: 2 MEWS RR: 1 MEWS LOC: 1 MEWS Score: 4 MEWS Score Color: Red Assess: SIRS CRITERIA SIRS Temperature : 0 SIRS Respirations : 1 SIRS Pulse: 1 SIRS WBC: 0 SIRS Score Sum : 2 SIRS Temperature : 0 SIRS Pulse: 1 SIRS Respirations : 0 SIRS WBC: 0 SIRS Score Sum : 1 Assess: if the MEWS score is Yellow or Red Were vital signs taken at a resting state?: Yes Focused Assessment: No change from prior assessment Does the patient meet 2 or more of the SIRS criteria?: Yes Does the patient have a confirmed or suspected source of infection?: No Provider and Rapid Response Notified?: Yes MEWS guidelines implemented : Yes, red Treat MEWS Interventions: Considered administering scheduled or prn medications/treatments as ordered Take Vital Signs Increase Vital Sign Frequency : Red: Q1hr x2, continue Q4hrs until patient remains green for 12hrs Escalate MEWS: Escalate: Red: Discuss with charge nurse and notify provider. Consider notifying RRT. If remains red for 2 hours consider need for higher level of care Notify: Charge Nurse/RN Name of Charge Nurse/RN Notified: Hilaria Ota RN Provider Notification Provider Name/Title: Dr. Conan Bowens Attending Date Provider Notified: 10/19/22 Time Provider Notified: 1155 Method of Notification: Page Notification Reason: Other (Comment) (Red MEWS) Test performed and critical result: Troponin 241 Date Critical Result Received: 10/22/2022 Time Critical Result Received: 0954 Provider  response: Evaluate remotely Date of Provider Response: 10/19/22 Time of Provider Response: 1155 Notify: Rapid Response Name of Rapid Response RN Notified: Helle RN Date Rapid Response Notified: 10/19/22 Time Rapid Response Notified: 1156  Kallie Locks 10/19/2022, 11:56 AM

## 2022-10-19 NOTE — Consult Note (Cosign Needed Addendum)
NAME:  Zachary Nolan, MRN:  [id number], DOB:  11-09-1940, LOS: 2 ADMISSION DATE:  10/18/2022, CONSULTATION DATE:  10/19/22 REFERRING MD:  Joya Martyr CHIEF COMPLAINT:  Cardiac Arrest   History of Present Illness:  Pt is encephelopathic; therefore, this HPI is obtained from chart review.  Zachary Nolan is a 82 y.o. male who has a PMH as below including but not limited to sCHF, CAD s/p STEMI 2011 s/p PCI with DES to RCA, ICM, HTN, HLD, DM, OSA. He was admitted to [facility] 4/22 with acute CHF exacerbation, new A.fib with RVR, and RLL PE. Echo 4/22 demonstrated severe biventricular failure with LVEF 20-25%, severe MR, moderately calcified AoV and mod to severe AS, global hypokinesis, mod concentric LVH, severely reduced RV function with mod elevated RSVP. He was started on heparin, lasix, and Cardizem; however, Cardizem was stopped after his echo revealed worsening EF and he was started on Amiodarone. Milrinone also added after echo results were noted. LE duplex 4/23 demonstrate LLE DVT.  Overnight 4/24, he developed respiratory arrest leading to PEA. He received 4 epi, amio 150, 1 bicarb, 1 calcium. He required intubation for airway protection. Supposedly roughly 15 - 20 minutes of CPR; though he has been somewhat purposeful after arrival to ICU; therefore, unsure if truly was PEA for the duration.  PCCM subsequently called for ICU transfer.  Upon review of chart, pt had discussed DNR with CHF team (Dr. Shirlee Latch) and wanted to discuss further with wife per notes 4/23. Palliative care was consulted and they planned to have a family meeting on 4/25 for goals of care.  After pt arrived in ICU, I managed to reach his wife, Zachary Nolan over the phone. She was understandably upset of the fact that he suffered an arrest, but she assured me that pt had told her that he would never want heroic measures or life support machines to keep him alive. She understands that he has already been placed on a ventilator tonight and is OK with  keeping things as is until family has a chance to visit with pt. In the interim, we will continue supportive care but family is against escalating things to include further life sustaining measures in the event of further decline.   Pertinent  Medical History:  has HYPERCHOLESTEROLEMIA; Coronary artery disease involving native coronary artery of native heart without angina pectoris; SINOATRIAL NODE DYSFUNCTION; Essential hypertension, benign; OSA (obstructive sleep apnea); Obesity; Chest pain, rule out acute myocardial infarction; Uncontrolled type 2 diabetes mellitus with hyperglycemia, without long-term current use of insulin; CKD (chronic kidney disease) stage 3, GFR 30-59 ml/min; AKI (acute kidney injury); SOB (shortness of breath); Elevated troponin; Hyperlipidemia; Atrial fibrillation with rapid ventricular response; Acute on chronic combined systolic and diastolic CHF (congestive heart failure); Multiple subsegmental pulmonary emboli without acute cor pulmonale; CAP (community acquired pneumonia); and Hyponatremia on their problem list.  Significant Hospital Events: Including procedures, antibiotic start and stop dates in addition to other pertinent events   4/22 admit 4/24 respiratory leading to PEA arrest, intubated and transferred to ICU. 4/24 discussion with spouse over the phone, DNR issued  Interim History / Subjective:  On vent, unresponsive.  Objective:  Blood pressure 105/62, pulse (!) 112, temperature (!) 96.5 F (35.8 C), temperature source Axillary, resp. rate (!) 24, height  (1.753 m), weight 88.3 kg, SpO2 (!) 87 %. CVP:  [15 mmHg-17 mmHg] 17 mmHg      Intake/Output Summary (Last 24 hours) at 10/19/2022 2303 Last data filed at 10/19/2022 1549 Gross  per 24 hour  Intake 527.49 ml  Output 200 ml  Net 327.49 ml   Filed Weights   10/24/2022 0934 10/18/22 0215 10/19/22 0328  Weight: 88.5 kg 90 kg 88.3 kg    Examination: General: Elderly male, chronically ill appearing,  critically ill. Neuro: Not responsive during my exam, no sedation. However, nursing states he became somewhat purposeful thereafter. HEENT: [place]/AT. Sclerae anicteric. ETT in place. Cardiovascular: IRIR, no M/R/G.  Lungs: Respirations even and unlabored.  Coarse bilaterally R > L. Abdomen: BS x 4, soft, NT/ND.  Musculoskeletal: No gross deformities, no edema.  Skin: Intact, warm, no rashes.  Assessment & Plan:   Respiratory leading to cardiac arrest - presumed hypoxia related, now s/p s/p intubation following code 4/24. Volume overload/acute pulmonary edema. - Full vent support. - Wean as mental status allows. - Continue diuresis, currently on lasix gtt. - Bronchial hygiene. - CXR intermittently.  Acute on chronic biventricular failure - now worsened with EF 20-25% on current echo with mod MR, mod to severe AS. Volume overload. - Continue Milrinone per CHF team. - Continue Furosemide infusion. - F/u labs post code. - Follow co-ox, CVP. - R/LHC planned though will have to await to see what recovery he has after arrest tonight. - Consider transition to comfort in AM depending on his course through the night.  A.fib with RVR. - Continue Amiodarone and Heparin infusions. - TEE-DCCV if has meaningful recovery.  RLL PE and LLE DVT. - Continue Heparin infusion.  CKD 3 - presumed cardiorenal. - Continue inotropic support with ongoing diuresis.  Hx CAD c/b STEMI s/p PCI 2011, ICM, HTN, HLD. - Per cards/CHF team.  Hx DM. - SSI.  DNR/Goals of care. - Long discussion with spouse Zachary Nolan over the phone. She was understandably upset of the fact that he suffered an arrest, but she assured me that pt had told her that he would never want heroic measures or life support machines to keep him alive. She understands that he has already been placed on a ventilator tonight and is OK with keeping things as is until family has a chance to visit with pt. In the interim, we will continue  supportive care but family is against escalating things to include further life sustaining measures in the event of further decline. If he worsens or has no meaningful recovery over the next few days, family to consider comfort measures. Palliative care has been consulted and plans to have a family meeting 4/25.   Best practice (evaluated daily):  Diet/type: NPO DVT prophylaxis: systemic heparin GI prophylaxis: PPI Lines: Central line - PICC Foley:  Yes, and it is still needed Code Status:  DNR Last date of multidisciplinary goals of care discussion: None yet.  Labs   CBC: Recent Labs  Lab 10/15/2022 0850 10/18/22 0642 10/19/22 0424 10/19/22 1410  WBC 9.5 7.6 7.8 8.3  HGB 11.3* 10.9* 10.2* 9.5*  HCT 34.6* 33.7* 32.8* 30.7*  MCV 89.2 88.5 90.6 91.4  PLT 201 226 232 227    Basic Metabolic Panel: Recent Labs  Lab 10/10/2022 0850 10/18/22 0642 10/19/22 0424 10/19/22 1410 10/19/22 1527  NA 132* 135 133* 128* 134*  K 4.6 3.8 3.7 3.6 3.8  CL 101 103 101 93* 100  CO2 17* 19* 20* 18* 19*  GLUCOSE 412* 184* 210* 529* 332*  BUN 36* 37* 42* 42* 45*  CREATININE 1.63* 1.77* 1.94* 2.29* 2.40*  CALCIUM 9.7 9.3 9.2 8.3* 8.9  MG  --  2.0 2.1  --   --  GFR: Estimated Creatinine Clearance: 25.6 mL/min (A) (by C-G formula based on SCr of 2.4 mg/dL (H)). Recent Labs  Lab 10/05/2022 0850 10/02/2022 1230 10/24/2022 1534 10/18/22 0642 10/19/22 0424 10/19/22 1410  PROCALCITON <0.10  --   --   --   --   --   WBC 9.5  --   --  7.6 7.8 8.3  LATICACIDVEN  --  1.6 2.0*  --   --  1.2    Liver Function Tests: Recent Labs  Lab 10/16/2022 1013 10/19/22 1410  AST 14* 17  ALT 11 14  ALKPHOS 79 70  BILITOT 1.0 1.0  PROT 7.1 6.1*  ALBUMIN 3.5 3.1*   Recent Labs  Lab 10/18/2022 1013  LIPASE 30   Recent Labs  Lab 10/19/22 1527  AMMONIA 18    ABG    Component Value Date/Time   PHART 7.28 (L) 10/19/2022 1748   PCO2ART 45 10/19/2022 1748   PO2ART 72 (L) 10/19/2022 1748   HCO3 21.1  10/19/2022 1748   TCO2 26 07/19/2017 1959   ACIDBASEDEF 5.6 (H) 10/19/2022 1748   O2SAT 95.6 10/19/2022 1748     Coagulation Profile: No results for input(s): "INR", "PROTIME" in the last 168 hours.  Cardiac Enzymes: No results for input(s): "CKTOTAL", "CKMB", "CKMBINDEX", "TROPONINI" in the last 168 hours.  HbA1C: Hgb A1c MFr Bld  Date/Time Value Ref Range Status  10/15/2022 03:34 PM 12.5 (H) 4.8 - 5.6 % Final    Comment:    (NOTE) Pre diabetes:          5.7%-6.4%  Diabetes:              >6.4%  Glycemic control for   <7.0% adults with diabetes   11/06/2021 06:55 AM 11.2 (H) 4.8 - 5.6 % Final    Comment:    (NOTE) Pre diabetes:          5.7%-6.4%  Diabetes:              >6.4%  Glycemic control for   <7.0% adults with diabetes     CBG: Recent Labs  Lab 10/18/22 2140 10/19/22 0712 10/19/22 1136 10/19/22 1517 10/19/22 1603  GLUCAP 175* 220* 213* 223* 213*    Review of Systems:   Unable to obtain as pt is encephalopathic.  Past Medical History:  He,  has a past medical history of CAD (coronary artery disease), Cancer, Depression, Diabetes mellitus, DJD (degenerative joint disease), HTN (hypertension), cardiovascular stress test, Hyperlipemia, Ischemic cardiomyopathy, OSA (obstructive sleep apnea) (09/10/2014), RLS (restless legs syndrome), and Seasonal allergies.   Surgical History:   Past Surgical History:  Procedure Laterality Date   COLONOSCOPY     NOSE SURGERY     Percutaneous coronary intervention using a drug-eluting stent (Promus)     Moderately    severe left anterior descending stenosis.  Moderate left     circumflex stenosis, moderate left ventricular dysfunction with   left  ventricular ejection fraction of 35% to 40%.    Release of left transcarpal ligament.     Release of right transcarpal ligament.     TONSILLECTOMY       Social History:   reports that he quit smoking about 56 years ago. His smoking use included cigarettes. He has never  used smokeless tobacco. He reports that he does not drink alcohol and does not use drugs.   Family History:  His family history includes Asthma in his mother; Coronary artery disease in his unknown relative; Diabetes in his  father; Heart attack in his brother and father; Hypertension in his brother, father, and mother. There is no history of Stroke.   Allergies Allergies  Allergen Reactions   Shellfish-Derived Products Swelling    JUST CRAB MEAT   Statins Other (See Comments)    Dizziness and myalgias     Home Medications  Prior to Admission medications   Medication Sig Start Date End Date Taking? Authorizing Provider  acetaminophen (TYLENOL) 650 MG CR tablet Take 650-1,300 mg by mouth every 8 (eight) hours as needed for pain.   Yes [provider]  ALPRAZolam Prudy Feeler) 0.5 MG tablet Take 0.5 mg by mouth at bedtime.   Yes [provider]  furosemide (LASIX) 20 MG tablet Take 40 mg by mouth as needed. 01/31/22  Yes [provider]  gabapentin (NEURONTIN) 300 MG capsule Take 300 mg by mouth at bedtime. 10/22/21  Yes [provider]  JARDIANCE 10 MG TABS tablet Take 10 mg by mouth daily. 08/23/21  Yes [provider]  metFORMIN (GLUCOPHAGE) 1000 MG tablet Take 1 tablet by mouth daily at 6 (six) AM.   Yes [provider]  metoprolol tartrate (LOPRESSOR) 25 MG tablet Take 1/2 tablet (12.5 mg total) by mouth 2 (two) times daily. 11/08/21  Yes Amin, Loura Halt, MD  traMADol (ULTRAM) 50 MG tablet Take 50 mg by mouth daily at 6 (six) AM. 01/27/22  Yes [provider]  VENTOLIN HFA 108 (90 Base) MCG/ACT inhaler Inhale 2 puffs into the lungs every 4 (four) hours as needed for wheezing or shortness of breath. 08/27/15  Yes [provider]     Critical care time: 50 min.   Rutherford Guys, PA - [facility] For pager details, please see AMION or use Epic chat  After 1900, please call [facility] for cross  coverage needs 10/19/2022, 11:03 PM

## 2022-10-19 NOTE — Progress Notes (Signed)
Upon assessment, pt still responsive to voice. Opens eyes. Noted that he is having SOB. Spo2 dropping. Non re- breather mask was placed. Rapid RN was called. Code blue initiated.

## 2022-10-19 NOTE — Progress Notes (Addendum)
Advanced Heart Failure Rounding Note  PCP-Cardiologist: Donato Schultz, MD   Subjective:    4/23: PICC placed. Initial Co-ox 51%. Stated on Milrinone 0.25.  Venous dopplers + for Lt LE DVT (left posterior tibial veins)  Agitated overnight, pulling at lines and refused BiPAP. Given Xanax and Zyprexa. Lethargic and confused this morning. Arouses to stimuli. CO2 20, Na 133    Remains on Milrinone 0.25. Co-ox improved, 69%.   UOP increasing, 2.4L out yesterday w/ inotropic support. Wt down 4 lb. CVP 16   Scr continues to trend up, 1.63>>1.77>>1.92  K 3.8  Remains in Afib 120s. BP normotensive. On amio gtt + heparin.    Objective:   Weight Range: 88.3 kg Body mass index is 28.75 kg/m.   Vital Signs:   Temp:  [97.6 F (36.4 C)-98.6 F (37 C)] 98.6 F (37 C) (04/24 0712) Pulse Rate:  [88-122] 122 (04/24 0712) Resp:  [16-29] 29 (04/24 0712) BP: (105-134)/(66-116) 134/116 (04/24 0712) SpO2:  [85 %-96 %] 90 % (04/24 0712) Weight:  [88.3 kg] 88.3 kg (04/24 0328) Last BM Date : 10/18/22  Weight change: Filed Weights   10/02/2022 0934 10/18/22 0215 10/19/22 0328  Weight: 88.5 kg 90 kg 88.3 kg    Intake/Output:   Intake/Output Summary (Last 24 hours) at 10/19/2022 0716 Last data filed at 10/19/2022 0700 Gross per 24 hour  Intake 1506.46 ml  Output 2350 ml  Net -843.54 ml      Physical Exam    CVP 16  General:  elderly, lethargic but arouses to stimuli, snoring HEENT: Normal Neck: Supple. JVP elevated ~14 cm . Carotids 2+ bilat; no bruits. No lymphadenopathy or thyromegaly appreciated. Cor: PMI nondisplaced. Irregularly irregular rhythm and rate. 2/6 AS murmur, 3/6 MR murmur  Lungs: diffuse rhonchi anteriorly  Abdomen: Soft, nontender, +distended. No hepatosplenomegaly. No bruits or masses. Good bowel sounds. Extremities: No cyanosis, clubbing, rash, trace b/l LE edema Neuro: lethargic but arouses to stimuli, confusion and agitation when awake    Telemetry    Atrial fibrillation 120s   EKG    No new EKG to review   Labs    CBC Recent Labs    10/18/22 0642 10/19/22 0424  WBC 7.6 7.8  HGB 10.9* 10.2*  HCT 33.7* 32.8*  MCV 88.5 90.6  PLT 226 232   Basic Metabolic Panel Recent Labs    16/10/96 0642 10/19/22 0424  NA 135 133*  K 3.8 3.7  CL 103 101  CO2 19* 20*  GLUCOSE 184* 210*  BUN 37* 42*  CREATININE 1.77* 1.94*  CALCIUM 9.3 9.2  MG 2.0 2.1   Liver Function Tests Recent Labs    10/07/2022 1013  AST 14*  ALT 11  ALKPHOS 79  BILITOT 1.0  PROT 7.1  ALBUMIN 3.5   Recent Labs    10/14/2022 1013  LIPASE 30   Cardiac Enzymes No results for input(s): "CKTOTAL", "CKMB", "CKMBINDEX", "TROPONINI" in the last 72 hours.  BNP: BNP (last 3 results) Recent Labs    11/05/21 1821 09/26/2022 0850  BNP 139.3* 761.8*    ProBNP (last 3 results) No results for input(s): "PROBNP" in the last 8760 hours.   D-Dimer Recent Labs    09/29/2022 1002  DDIMER 2.44*   Hemoglobin A1C Recent Labs    10/21/2022 1534  HGBA1C 12.5*   Fasting Lipid Panel Recent Labs    10/18/22 0642  CHOL 208*  HDL 31*  LDLCALC 162*  TRIG 74  CHOLHDL 6.7  Thyroid Function Tests Recent Labs    10/18/22 0642  TSH 0.733    Other results:   Imaging    VAS [place] LOWER EXTREMITY VENOUS (DVT)  Result Date: 10/18/2022  Lower Venous DVT Study Patient Name:  Zachary Nolan  Date of Exam:   10/18/2022 Medical Rec #: [id number]   Accession #:    [id number] Date of Birth: [birth year]   Patient Gender: M Patient Age:   82 years Exam Location:  [facility] Procedure:      VAS [place] LOWER EXTREMITY VENOUS (DVT) Referring Phys: Stephania Fragmin --------------------------------------------------------------------------------  Indications: Pulmonary embolism.  Risk Factors: Confirmed PE. Anticoagulation: Heparin. Limitations: Poor ultrasound/tissue interface. Comparison Study: No prior studies. Performing Technologist: Chanda Busing RVT  Examination  Guidelines: A complete evaluation includes B-mode imaging, spectral Doppler, color Doppler, and power Doppler as needed of all accessible portions of each vessel. Bilateral testing is considered an integral part of a complete examination. Limited examinations for reoccurring indications may be performed as noted. The reflux portion of the exam is performed with the patient in reverse Trendelenburg.  +---------+---------------+---------+-----------+----------+--------------+ RIGHT    CompressibilityPhasicitySpontaneityPropertiesThrombus Aging +---------+---------------+---------+-----------+----------+--------------+ CFV      Full           Yes      Yes                                 +---------+---------------+---------+-----------+----------+--------------+ SFJ      Full                                                        +---------+---------------+---------+-----------+----------+--------------+ FV Prox  Full                                                        +---------+---------------+---------+-----------+----------+--------------+ FV Mid   Full                                                        +---------+---------------+---------+-----------+----------+--------------+ FV DistalFull                                                        +---------+---------------+---------+-----------+----------+--------------+ PFV      Full                                                        +---------+---------------+---------+-----------+----------+--------------+ POP      Full           Yes      Yes                                 +---------+---------------+---------+-----------+----------+--------------+  PTV      Full                                                        +---------+---------------+---------+-----------+----------+--------------+ PERO     Full                                                         +---------+---------------+---------+-----------+----------+--------------+   +---------+---------------+---------+-----------+----------+-------------------+ LEFT     CompressibilityPhasicitySpontaneityPropertiesThrombus Aging      +---------+---------------+---------+-----------+----------+-------------------+ CFV      Full           Yes      Yes                                      +---------+---------------+---------+-----------+----------+-------------------+ SFJ      Full                                                             +---------+---------------+---------+-----------+----------+-------------------+ FV Prox  Full                                                             +---------+---------------+---------+-----------+----------+-------------------+ FV Mid   Full                                                             +---------+---------------+---------+-----------+----------+-------------------+ FV DistalFull                                                             +---------+---------------+---------+-----------+----------+-------------------+ PFV      Full                                                             +---------+---------------+---------+-----------+----------+-------------------+ POP      Full           Yes      Yes                                      +---------+---------------+---------+-----------+----------+-------------------+ PTV  None                                         Acute               +---------+---------------+---------+-----------+----------+-------------------+ PERO                                                  Not well visualized +---------+---------------+---------+-----------+----------+-------------------+     Summary: RIGHT: - There is no evidence of deep vein thrombosis in the lower extremity.  - No cystic structure found in the popliteal fossa.  LEFT: - Findings  consistent with acute deep vein thrombosis involving the left posterior tibial veins. - No cystic structure found in the popliteal fossa.  *See table(s) above for measurements and observations. Electronically signed by Coral Else MD on 10/18/2022 at 7:29:03 PM.    Final    DG Chest Port 1 View  Result Date: 10/18/2022 CLINICAL DATA:  PICC line placement. EXAM: PORTABLE CHEST 1 VIEW COMPARISON:  October 22, 2022 FINDINGS: The right-sided PICC line is in good position. The tip is in the distal SVC. No complicating features. The heart is mildly enlarged but appears stable. Persistent bibasilar atelectasis. IMPRESSION: Right-sided PICC line in good position without complicating features. Electronically Signed   By: Rudie Meyer M.D.   On: 10/18/2022 15:03   [place] EKG SITE RITE  Result Date: 10/18/2022 If Site Rite image not attached, placement could not be confirmed due to current cardiac rhythm.    Medications:     Scheduled Medications:  ALPRAZolam  0.5 mg Oral QHS   Chlorhexidine Gluconate Cloth  6 each Topical Daily   ezetimibe  10 mg Oral Daily   furosemide  80 mg Intravenous BID   gabapentin  300 mg Oral QHS   insulin aspart  0-15 Units Subcutaneous TID WC   insulin aspart  0-5 Units Subcutaneous QHS   insulin glargine-yfgn  10 Units Subcutaneous Daily   pneumococcal 20-valent conjugate vaccine  0.5 mL Intramuscular Tomorrow-1000    Infusions:  amiodarone 30 mg/hr (10/19/22 0700)   heparin 1,500 Units/hr (10/19/22 0700)   milrinone 0.25 mcg/kg/min (10/19/22 0700)    PRN Medications: acetaminophen **OR** acetaminophen, guaiFENesin, hydrALAZINE, levalbuterol, melatonin, metoprolol tartrate, OLANZapine, ondansetron (ZOFRAN) IV, senna-docusate, sodium chloride flush    Patient Profile   82 y/o male w/ CAD s/p inferior STEMI in 2011 w/ PCI to RCA, residual mod LAD and LCx treated medically, h/o systolic heart failure w/ improved EF, Aortic Stenosis, T2DM, HTN, HLD, CKD IIIb and  untreated OSA, admitted w/ acute systolic heart failure, new Afib w/ RVR and acute PE.   Assessment/Plan   1. Acute on chronic systolic CHF: Patient had STEMI in 2011 with ischemic cardiomyopathy at that time, EF 35-40%.  In 5/23, EF up to 60-65%.  This admission, echo showed EF 25%, mid-moderate RV dysfunction, low flow/low gradient moderate AS, mod-severe MR, dilated IVC.  Cause for fall in EF uncertain.  Possibly due to atrial fibrillation with RVR, uncertain how long he has been in AF.  It is also possible that he has had worsening of CAD with ischemic CMP.  However, he did not present with ACS and denies chest pain.  Admitted w/ volume overload and low output. Initial poor  response to IV diuretics w/ increasing SCr. PICC placed. Initial Co-ox 51%. Started on Milrinone 0.25 mcg/kg/min. Co-ox today improved to 69%. UOP improved, 2.4L out yesterday, though SCr continues to trend up. CVP 16 - continue milrinone 0.25 - continue IV Lasix 80 mg bid  - Will need eventual LHC/RHC to assess for worsening coronary disease.  - Will need eventual cardioversion.  2. Atrial fibrillation: New diagnosis, admitted with RVR of uncertain duration.  Possible tachy-mediated CMP.   - Continue heparin gtt.  - Amiodarone gtt.  - eventual Eliquis after completion of invasive procedures  - Will need TEE-DCCV when he is better diuresed.  3. PE/DVT: Acute PE, RLL.  Patient's wife says that he has been essentially immobile for the last 2 wks.  No h/o VTE.  Venous dopplers + for Lt LE DVT (left posterior tibial veins) - Heparin gtt.  4. CAD: H/o inferior STEMI in 2011 with DES to distal RCA. No chest pain this admission, troponin mildly elevated with no trend.  Doubt ACS.  As above, concern that worsening CAD could account for fall in EF.  - Unable to tolerate statins, added Zetia 10 mg daily. - Will need eventual coronary angiography.  5. CKD stage 3: Creatinine 1.6 => 1.77 =>1.92 since admission, suspect cardiorenal.    - continue inotropes and diuresis  - avoid hypotension and follow closely.  6. Aortic stenosis: Low flow/low gradient moderate AS on echo, follow. 7. Mitral regurgitation: Moderate-severe, probably functional.  Hopefully this will improve with diuresis and NSR. 8. Agitation/ Delerium: per internal medicine    Length of Stay: 2  Robbie Lis, PA-C  10/19/2022, 7:16 AM  Advanced Heart Failure Team Pager [phone] (M-F; 7a - 5p)  Please contact [facility] for night-coverage after hours (5p -7a ) and weekends on [email or web address]  Patient seen with PA, agree with the above note.   Co-ox up 50%  => 69% on milrinone 0.25.  HR elevated around 120 in atrial fibrillation, on amiodarone 30 mg/hr.  Starting to diurese better with Lasix but creatinine higher 1.94. CVP 16 today.   Heparin gtt for DVT/PE, atrial fibrillation.   He is confused this morning, oriented only to person.  Afebrile, WBCs normal.   General: NAD Neck: JVP 16 cm, no thyromegaly or thyroid nodule.  Lungs: Clear to auscultation bilaterally with normal respiratory effort. CV: Nondisplaced PMI.  Heart tachy, irregular S1/S2, no S3/S4, no murmur.  1+ ankle edema.  Abdomen: Soft, nontender, no hepatosplenomegaly, no distention.  Skin: Intact without lesions or rashes.  Neurologic: Alert but oriented only to person.  Confused.  Extremities: No clubbing or cyanosis.  HEENT: Normal.   Delirium this morning, cause uncertain.  Afebrile, normal WBCs.  - Will get CXR - Send UA - NH3  Co-ox improved on milrinone 0.25, remains volume overloaded with CVP 16.  Creatinine mildly higher at 1.94.  - Continue milrinone 0.25.  - Lasix 80 mg IV x 1 and will start Lasix gtt 12 mg/hr.  - Ideally will get RHC/LHC when volume better controlled and creatinine coming down.  - Hold off on GDMT with rise in creatinine.   He remains in AF with RVR 120s, was better-controlled overnight.  - Increase amiodarone to 60 mg/hr.  - Continue heparin  gtt.  - Will need eventual TEE-DCCV when he is better diuresed and ideally weaned down off milrinone.   DVT/PE, likely due to lack of activity recently (very sedentary, has been in bed for several weeks).  - Continue  heparin gtt, eventual DOAC.   Marca Ancona 10/19/2022 10:41 AM

## 2022-10-19 NOTE — Progress Notes (Signed)
eLink Physician-Brief Progress Note Patient Name: Zachary Nolan DOB: 07-11-1940 MRN: [id number]   Date of Service  10/19/2022  HPI/Events of Note  Patient with heart failure secondary to systolic dysfunction, pulmonary embolism who went into cardio-respiratory arrest on the step down unit, received 16 minutes of CPR, was intubated and transferred to the ICU.  eICU Interventions  New Patient Evaluation.        [name] 10/19/2022, 11:06 PM

## 2022-10-19 NOTE — Progress Notes (Signed)
   10/19/22 1503  Provider Notification  Provider Name/Title Dr. Jomarie Longs Attending  Date Provider Notified 10/19/22  Time Provider Notified 1507  Method of Notification Page  Notification Reason Critical Result  Test performed and critical result Glucose 529  Date Critical Result Received 10/19/22  Time Critical Result Received 1503  Provider response Evaluate remotely;See new orders  Date of Provider Response 10/19/22  Time of Provider Response 1509   CRITICAL VALUE STICKER  CRITICAL VALUE: Glucose 529   RECEIVER (on-site recipient of call): Ardeen Fillers RN   DATE & TIME NOTIFIED: 10/19/2022 1503  MESSENGER (representative from lab): Tobey Bride  MD NOTIFIED:  Dr. Jomarie Longs    TIME OF NOTIFICATION: 1507   RESPONSE: See new orders.

## 2022-10-19 NOTE — Progress Notes (Signed)
PROGRESS NOTE    Zachary Nolan  ZOX:[id number] DOB: 09-04-1940 DOA: October 22, 2022 PCP: Linus Galas, NP  82 y.o. male with medical history significant of hypertension, hyperlipidemia, CAD s/p DES to RCA in 2011, diabetes mellitus type 2, and BPH who presented with complaints of shortness of breath is worsened over the last 2 weeks.  Upon admission patient diagnosed with pulmonary embolism with CHF, ECHo noted severe biventricular failure, EF 25%, moderate to severe MR and moderate aortic stenosis.  Also noted to have new A-fib with RVR -4/23 overnight with delirium and agitation  Subjective: Confused agitated overnight, required Zyprexa  Assessment and Plan:  Acute on chronic systolic CHF, BiV failure Mitral regurgitation, moderate to severe Moderate aortic stenosis -Known ischemic cardiomyopathy, previously EF 35-40%, recovered EF in 5/23 -Now admitted with severe biventricular failure, low output -Echo this admit noted EF 25%, moderately reduced RV function, moderate AAS, moderate to severe MR -Heart failure team following, now on milrinone drip, Co. ox improving, 2.4 liters urine output yesterday -Continue IV Lasix -Eventual right and left heart cath as well as cardioversion -Palliative consulted for goals of care meeting  New A-fib with RVR -Continue amiodarone and heparin gtt. -Plan for eventual TEE/DCCV  Acute DVT/PE -Continue heparin GTT, transition to DOAC once workup completed  Agitation, delirium -Could be secondary to hospitalization, UA from 22-Oct-2022 was unremarkable, no fevers or leukocytosis -Xanax could also be contributing, DC benzos -Follow-up ammonia level, if mentation does not improve early this afternoon will check ABG  History of CAD Elevated troponin likely secondary to demand ischemia -Prior RCA stent in 2011 -Now on Zetia, could not tolerate statins  Uncontrolled type 2 diabetes mellitus -HbA1c is 12.5, increase glargine dose  CKD 3B -Creatinine up to 1.9  today from baseline around 1.7, continue milrinone and diuretics  OSA -History of sleep apnea, unable to tolerate CPAP, will rechallenge   DVT prophylaxis: IV heparin Code Status: Full code Family Communication: None present Disposition Plan: May need place meant  Consultants:    Procedures:   Antimicrobials:    Objective: Vitals:   10/19/22 0700 10/19/22 0712 10/19/22 0825 10/19/22 0900  BP:  (!) 134/116 122/74 113/87  Pulse: (!) 120 (!) 122 (!) 127 (!) 131  Resp: 20 (!) 29 (!) 22 20  Temp:  98.6 F (37 C) 98.5 F (36.9 C) 98.4 F (36.9 C)  TempSrc:  Oral Oral Oral  SpO2: 93% 90% 93% 95%  Weight:      Height:        Intake/Output Summary (Last 24 hours) at 10/19/2022 1054 Last data filed at 10/19/2022 0700 Gross per 24 hour  Intake 1506.46 ml  Output 1700 ml  Net -193.54 ml   Filed Weights   10-22-2022 0934 10/18/22 0215 10/19/22 0328  Weight: 88.5 kg 90 kg 88.3 kg    Examination:  General exam: Elderly chronically ill male laying in bed, somnolent mumbles few words, orientation could not be assessed HEENT: Positive JVD CVS: S1-S2, irregularly irregular rhythm, systolic murmur Lungs: Decreased breath sounds at the bases Abdomen: Soft, nontender, bowel sounds present Extremities: 1+ edema    Data Reviewed:   CBC: Recent Labs  Lab 10-22-22 0850 10/18/22 0642 10/19/22 0424  WBC 9.5 7.6 7.8  HGB 11.3* 10.9* 10.2*  HCT 34.6* 33.7* 32.8*  MCV 89.2 88.5 90.6  PLT 201 226 232   Basic Metabolic Panel: Recent Labs  Lab 10/22/22 0850 10/18/22 0642 10/19/22 0424  NA 132* 135 133*  K 4.6 3.8 3.7  CL 101 103 101  CO2 17* 19* 20*  GLUCOSE 412* 184* 210*  BUN 36* 37* 42*  CREATININE 1.63* 1.77* 1.94*  CALCIUM 9.7 9.3 9.2  MG  --  2.0 2.1   GFR: Estimated Creatinine Clearance: 31.7 mL/min (A) (by C-G formula based on SCr of 1.94 mg/dL (H)). Liver Function Tests: Recent Labs  Lab 11/14/22 1013  AST 14*  ALT 11  ALKPHOS 79  BILITOT 1.0  PROT  7.1  ALBUMIN 3.5   Recent Labs  Lab Nov 14, 2022 1013  LIPASE 30   No results for input(s): "AMMONIA" in the last 168 hours. Coagulation Profile: No results for input(s): "INR", "PROTIME" in the last 168 hours. Cardiac Enzymes: No results for input(s): "CKTOTAL", "CKMB", "CKMBINDEX", "TROPONINI" in the last 168 hours. BNP (last 3 results) No results for input(s): "PROBNP" in the last 8760 hours. HbA1C: Recent Labs    Nov 14, 2022 1534  HGBA1C 12.5*   CBG: Recent Labs  Lab 10/18/22 0603 10/18/22 1134 10/18/22 1549 10/18/22 2140 10/19/22 0712  GLUCAP 186* 219* 219* 175* 220*   Lipid Profile: Recent Labs    10/18/22 0642  CHOL 208*  HDL 31*  LDLCALC 162*  TRIG 74  CHOLHDL 6.7   Thyroid Function Tests: Recent Labs    10/18/22 0642  TSH 0.733   Anemia Panel: No results for input(s): "VITAMINB12", "FOLATE", "FERRITIN", "TIBC", "IRON", "RETICCTPCT" in the last 72 hours. Urine analysis:    Component Value Date/Time   COLORURINE YELLOW 14-Nov-2022 0945   APPEARANCEUR HAZY (A) 11-14-22 0945   LABSPEC 1.030 11-14-22 0945   PHURINE 5.0 11/14/2022 0945   GLUCOSEU >=500 (A) Nov 14, 2022 0945   HGBUR SMALL (A) 11/14/22 0945   BILIRUBINUR NEGATIVE Nov 14, 2022 0945   KETONESUR 20 (A) 11-14-2022 0945   PROTEINUR 100 (A) 14-Nov-2022 0945   NITRITE NEGATIVE 11-14-22 0945   LEUKOCYTESUR NEGATIVE 14-Nov-2022 0945   Sepsis Labs: @LABRCNTIP (procalcitonin:4,lacticidven:4)  ) Recent Results (from the past 240 hour(s))  Resp panel by RT-PCR (RSV, Flu A&B, Covid) Anterior Nasal Swab     Status: None   Collection Time: 11/14/22  8:36 AM   Specimen: Anterior Nasal Swab  Result Value Ref Range Status   SARS Coronavirus 2 by RT PCR NEGATIVE NEGATIVE Final   Influenza A by PCR NEGATIVE NEGATIVE Final   Influenza B by PCR NEGATIVE NEGATIVE Final    Comment: (NOTE) The Xpert Xpress SARS-CoV-2/FLU/RSV plus assay is intended as an aid in the diagnosis of influenza from  Nasopharyngeal swab specimens and should not be used as a sole basis for treatment. Nasal washings and aspirates are unacceptable for Xpert Xpress SARS-CoV-2/FLU/RSV testing.  Fact Sheet for Patients: BloggerCourse.com  Fact Sheet for Healthcare Providers: SeriousBroker.it  This test is not yet approved or cleared by the [place] FDA and has been authorized for detection and/or diagnosis of SARS-CoV-2 by FDA under an Emergency Use Authorization (EUA). This EUA will remain in effect (meaning this test can be used) for the duration of the COVID-19 declaration under Section 564(b)(1) of the Act, 21 U.S.C. section 360bbb-3(b)(1), unless the authorization is terminated or revoked.     Resp Syncytial Virus by PCR NEGATIVE NEGATIVE Final    Comment: (NOTE) Fact Sheet for Patients: BloggerCourse.com  Fact Sheet for Healthcare Providers: SeriousBroker.it  This test is not yet approved or cleared by the [place] FDA and has been authorized for detection and/or diagnosis of SARS-CoV-2 by FDA under an Emergency Use Authorization (EUA). This EUA will remain in effect (meaning  this test can be used) for the duration of the COVID-19 declaration under Section 564(b)(1) of the Act, 21 U.S.C. section 360bbb-3(b)(1), unless the authorization is terminated or revoked.  Performed at [facility], [address] [place], [place] [address]   Culture, blood (routine x 2)     Status: None (Preliminary result)   Collection Time: 23-Oct-2022 12:30 PM   Specimen: BLOOD RIGHT ARM  Result Value Ref Range Status   Specimen Description BLOOD RIGHT ARM  Final   Special Requests   Final    BOTTLES DRAWN AEROBIC AND ANAEROBIC Blood Culture adequate volume   Culture   Final    NO GROWTH 2 DAYS Performed at [facility], [address], [place], [place] [address]    Report Status PENDING   Incomplete  Culture, blood (routine x 2)     Status: None (Preliminary result)   Collection Time: 2022/10/23  3:34 PM   Specimen: BLOOD RIGHT FOREARM  Result Value Ref Range Status   Specimen Description BLOOD RIGHT FOREARM  Final   Special Requests   Final    BOTTLES DRAWN AEROBIC AND ANAEROBIC Blood Culture adequate volume   Culture   Final    NO GROWTH 2 DAYS Performed at [facility], [address] [place], [place] [address]    Report Status PENDING  Incomplete  MRSA Next Gen by PCR, Nasal     Status: None   Collection Time: 10/18/22  2:16 AM   Specimen: Nasal Mucosa; Nasal Swab  Result Value Ref Range Status   MRSA by PCR Next Gen NOT DETECTED NOT DETECTED Final    Comment: (NOTE) The GeneXpert MRSA Assay (FDA approved for NASAL specimens only), is one component of a comprehensive MRSA colonization surveillance program. It is not intended to diagnose MRSA infection nor to guide or monitor treatment for MRSA infections. Test performance is not FDA approved in patients less than 67 years old. Performed at [facility], [address] [place], [place] [address]      Radiology Studies: VAS [place] LOWER EXTREMITY VENOUS (DVT)  Result Date: 10/18/2022  Lower Venous DVT Study Patient Name:  Zachary Nolan  Date of Exam:   10/18/2022 Medical Rec #: [id number]   Accession #:    [id number] Date of Birth: [birth year]   Patient Gender: M Patient Age:   18 years Exam Location:  [facility] Procedure:      VAS [place] LOWER EXTREMITY VENOUS (DVT) Referring Phys: Stephania Fragmin --------------------------------------------------------------------------------  Indications: Pulmonary embolism.  Risk Factors: Confirmed PE. Anticoagulation: Heparin. Limitations: Poor ultrasound/tissue interface. Comparison Study: No prior studies. Performing Technologist: Chanda Busing RVT  Examination Guidelines: A complete evaluation includes B-mode imaging, spectral Doppler, color Doppler, and power Doppler as  needed of all accessible portions of each vessel. Bilateral testing is considered an integral part of a complete examination. Limited examinations for reoccurring indications may be performed as noted. The reflux portion of the exam is performed with the patient in reverse Trendelenburg.  +---------+---------------+---------+-----------+----------+--------------+ RIGHT    CompressibilityPhasicitySpontaneityPropertiesThrombus Aging +---------+---------------+---------+-----------+----------+--------------+ CFV      Full           Yes      Yes                                 +---------+---------------+---------+-----------+----------+--------------+ SFJ      Full                                                        +---------+---------------+---------+-----------+----------+--------------+  FV Prox  Full                                                        +---------+---------------+---------+-----------+----------+--------------+ FV Mid   Full                                                        +---------+---------------+---------+-----------+----------+--------------+ FV DistalFull                                                        +---------+---------------+---------+-----------+----------+--------------+ PFV      Full                                                        +---------+---------------+---------+-----------+----------+--------------+ POP      Full           Yes      Yes                                 +---------+---------------+---------+-----------+----------+--------------+ PTV      Full                                                        +---------+---------------+---------+-----------+----------+--------------+ PERO     Full                                                        +---------+---------------+---------+-----------+----------+--------------+    +---------+---------------+---------+-----------+----------+-------------------+ LEFT     CompressibilityPhasicitySpontaneityPropertiesThrombus Aging      +---------+---------------+---------+-----------+----------+-------------------+ CFV      Full           Yes      Yes                                      +---------+---------------+---------+-----------+----------+-------------------+ SFJ      Full                                                             +---------+---------------+---------+-----------+----------+-------------------+ FV Prox  Full                                                             +---------+---------------+---------+-----------+----------+-------------------+  FV Mid   Full                                                             +---------+---------------+---------+-----------+----------+-------------------+ FV DistalFull                                                             +---------+---------------+---------+-----------+----------+-------------------+ PFV      Full                                                             +---------+---------------+---------+-----------+----------+-------------------+ POP      Full           Yes      Yes                                      +---------+---------------+---------+-----------+----------+-------------------+ PTV      None                                         Acute               +---------+---------------+---------+-----------+----------+-------------------+ PERO                                                  Not well visualized +---------+---------------+---------+-----------+----------+-------------------+     Summary: RIGHT: - There is no evidence of deep vein thrombosis in the lower extremity.  - No cystic structure found in the popliteal fossa.  LEFT: - Findings consistent with acute deep vein thrombosis involving the left posterior tibial veins.  - No cystic structure found in the popliteal fossa.  *See table(s) above for measurements and observations. Electronically signed by Coral Else MD on 10/18/2022 at 7:29:03 PM.    Final    ECHOCARDIOGRAM LIMITED  Result Date: 10/18/2022    ECHOCARDIOGRAM LIMITED REPORT   Patient Name:   Zachary Nolan Date of Exam: 07-Nov-2022 Medical Rec #:  [id number]  Height:       69.0 in Accession #:    [id number] Weight:       195.0 lb Date of Birth:  Oct 26, 1940  BSA:          2.044 m Patient Age:    82 years   BP:           143/92 mmHg Patient Gender: M          HR:           85 bpm. Exam Location:  Inpatient Procedure: 2D Echo, Cardiac Doppler and Color Doppler Indications:     Atrial fibrillation  History:  Patient has prior history of Echocardiogram examinations.  Sonographer:     NA Referring Phys:  [id number] DANIEL R BENSIMHON Diagnosing Phys: Freida Busman McleanMD IMPRESSIONS  1. Left ventricular ejection fraction, by estimation, is 25%. The left ventricle has severely decreased function. The left ventricle demonstrates global hypokinesis. Left ventricular diastolic parameters are indeterminate.  2. Right ventricular systolic function is moderately reduced. The right ventricular size is normal.  3. The mitral valve is normal in structure. The visualized mitral regurgitation looks mild but not sure that the full jet was visualized. No evidence of mitral stenosis.  4. The aortic valve is tricuspid. There is severe calcifcation of the aortic valve. Aortic valve regurgitation is not visualized. Low flow/low gradient moderate aortic valve stenosis. Aortic valve area, by VTI measures 1.38 cm. Aortic valve mean gradient measures 16.0 mmHg.  5. Aortic dilatation noted. There is mild dilatation of the ascending aorta, measuring 38 mm.  6. IVC not visualized.  7. The patient is in atrial fibrillation.  8. Limited echo FINDINGS  Left Ventricle: Left ventricular ejection fraction, by estimation, is 25%. The left ventricle has severely  decreased function. The left ventricle demonstrates global hypokinesis. Left ventricular diastolic parameters are indeterminate. Right Ventricle: The right ventricular size is normal. Right ventricular systolic function is moderately reduced. Pericardium: Trivial pericardial effusion is present. Mitral Valve: The mitral valve is normal in structure. Mild mitral valve regurgitation. No evidence of mitral valve stenosis. Tricuspid Valve: The tricuspid valve is normal in structure. Tricuspid valve regurgitation is not demonstrated. Aortic Valve: The aortic valve is tricuspid. There is severe calcifcation of the aortic valve. Aortic valve regurgitation is not visualized. Moderate aortic stenosis is present. Aortic valve mean gradient measures 16.0 mmHg. Aortic valve peak gradient measures 21.9 mmHg. Aortic valve area, by VTI measures 1.38 cm. Pulmonic Valve: The pulmonic valve was normal in structure. Pulmonic valve regurgitation is trivial. Aorta: Aortic dilatation noted. There is mild dilatation of the ascending aorta, measuring 38 mm. LEFT VENTRICLE PLAX 2D LVOT diam:     2.30 cm LV SV:         52 LV SV Index:   25 LVOT Area:     4.15 cm  AORTIC VALVE AV Area (Vmax):    1.30 cm AV Area (Vmean):   1.31 cm AV Area (VTI):     1.38 cm AV Vmax:           234.00 cm/s AV Vmean:          180.667 cm/s AV VTI:            0.376 m AV Peak Grad:      21.9 mmHg AV Mean Grad:      16.0 mmHg LVOT Vmax:         73.45 cm/s LVOT Vmean:        56.750 cm/s LVOT VTI:          0.125 m LVOT/AV VTI ratio: 0.33  SHUNTS Systemic VTI:  0.12 m Systemic Diam: 2.30 cm Dalton McleanMD Electronically signed by Wilfred Lacy Signature Date/Time: 11-14-2022/5:22:23 PM    Final (Updated)    DG Chest Port 1 View  Result Date: 10/18/2022 CLINICAL DATA:  PICC line placement. EXAM: PORTABLE CHEST 1 VIEW COMPARISON:  11-14-2022 FINDINGS: The right-sided PICC line is in good position. The tip is in the distal SVC. No complicating features. The  heart is mildly enlarged but appears stable. Persistent bibasilar atelectasis. IMPRESSION: Right-sided PICC line in good position without complicating features. Electronically  Signed   By: Rudie Meyer M.D.   On: 10/18/2022 15:03   [place] EKG SITE RITE  Result Date: 10/18/2022 If Site Rite image not attached, placement could not be confirmed due to current cardiac rhythm.  ECHOCARDIOGRAM COMPLETE  Result Date: 09/27/2022    ECHOCARDIOGRAM REPORT   Patient Name:   Zachary Nolan Date of Exam: 10/12/2022 Medical Rec #:  [id number]  Height:       69.0 in Accession #:    [id number] Weight:       195.0 lb Date of Birth:  [birth year]  BSA:          2.044 m Patient Age:    82 years   BP:           128/95 mmHg Patient Gender: M          HR:           97 bpm. Exam Location:  Inpatient Procedure: 2D Echo, Cardiac Doppler and Color Doppler Indications:     I48.91* Unspeicified atrial fibrillation  History:         Patient has prior history of Echocardiogram examinations. CAD;                  Risk Factors:Hypertension, Diabetes and Dyslipidemia.  Sonographer:     Mike Gip Referring Phys:  Clydie Braun Diagnosing Phys: Arvilla Meres MD IMPRESSIONS  1. Left ventricular ejection fraction, by estimation, is 20 to 25%. The left ventricle has severely decreased function. The left ventricle demonstrates global hypokinesis. There is moderate concentric left ventricular hypertrophy. Left ventricular diastolic function could not be evaluated.  2. Right ventricular systolic function is severely reduced. The right ventricular size is normal. There is moderately elevated pulmonary artery systolic pressure.  3. Left atrial size was moderately dilated.  4. Right atrial size was moderately dilated.  5. The mitral valve is normal in structure. Severe mitral valve regurgitation. No evidence of mitral stenosis.  6. The aortic valve is moderately calcified and the RCC is fixed. The transvalvualr gradient is not elevated by Doppler but  given severe LV dysfunction cannot esclude low gradient severe AS. Will repeat limited study with further interrogation of the AoV. The aortic valve is tricuspid. There is moderate calcification of the aortic valve. Aortic valve regurgitation is not visualized. Moderate to severe aortic valve stenosis.  7. The inferior vena cava is dilated in size with <50% respiratory variability, suggesting right atrial pressure of 15 mmHg.  8. EF has markedly decreased since previosu echo. FINDINGS  Left Ventricle: Left ventricular ejection fraction, by estimation, is 20 to 25%. The left ventricle has severely decreased function. The left ventricle demonstrates global hypokinesis. The left ventricular internal cavity size was normal in size. There is moderate concentric left ventricular hypertrophy. Left ventricular diastolic function could not be evaluated due to atrial fibrillation. Left ventricular diastolic function could not be evaluated. Right Ventricle: The right ventricular size is normal. No increase in right ventricular wall thickness. Right ventricular systolic function is severely reduced. There is moderately elevated pulmonary artery systolic pressure. The tricuspid regurgitant velocity is 2.92 m/s, and with an assumed right atrial pressure of 15 mmHg, the estimated right ventricular systolic pressure is 49.1 mmHg. Left Atrium: Left atrial size was moderately dilated. Right Atrium: Right atrial size was moderately dilated. Pericardium: There is no evidence of pericardial effusion. Mitral Valve: The mitral valve is normal in structure. Severe mitral valve regurgitation, with centrally-directed jet. No evidence of mitral valve stenosis.  Tricuspid Valve: The tricuspid valve is normal in structure. Tricuspid valve regurgitation is mild . No evidence of tricuspid stenosis. Aortic Valve: The aortic valve is moderately calcified and the RCC is fixed. The transvalvualr gradient is not elevated by Doppler but given severe LV  dysfunction cannot esclude low gradient severe AS. Will repeat limited study with further interrogation  of the AoV. The aortic valve is tricuspid. There is moderate calcification of the aortic valve. Aortic valve regurgitation is not visualized. Moderate to severe aortic stenosis is present. Pulmonic Valve: The pulmonic valve was normal in structure. Pulmonic valve regurgitation is mild. No evidence of pulmonic stenosis. Aorta: The aortic root is normal in size and structure. Venous: The inferior vena cava is dilated in size with less than 50% respiratory variability, suggesting right atrial pressure of 15 mmHg. IAS/Shunts: No atrial level shunt detected by color flow Doppler.  LEFT VENTRICLE PLAX 2D LVIDd:         5.20 cm      Diastology LVIDs:         4.60 cm      LV e' medial:    6.42 cm/s LV PW:         1.40 cm      LV E/e' medial:  16.0 LV IVS:        1.50 cm      LV e' lateral:   9.46 cm/s LVOT diam:     2.20 cm      LV E/e' lateral: 10.9 LV SV:         38 LV SV Index:   19 LVOT Area:     3.80 cm  LV Volumes (MOD) LV vol d, MOD A2C: 164.0 ml LV vol d, MOD A4C: 142.0 ml LV vol s, MOD A2C: 116.0 ml LV vol s, MOD A4C: 112.0 ml LV SV MOD A2C:     48.0 ml LV SV MOD A4C:     142.0 ml LV SV MOD BP:      41.1 ml RIGHT VENTRICLE            IVC RV Basal diam:  3.80 cm    IVC diam: 2.50 cm RV S prime:     9.79 cm/s TAPSE (M-mode): 1.4 cm LEFT ATRIUM              Index        RIGHT ATRIUM           Index LA diam:        4.80 cm  2.35 cm/m   RA Area:     22.80 cm LA Vol (A2C):   112.0 ml 54.80 ml/m  RA Volume:   65.20 ml  31.90 ml/m LA Vol (A4C):   95.9 ml  46.92 ml/m LA Biplane Vol: 104.0 ml 50.88 ml/m  AORTIC VALVE LVOT Vmax:   56.30 cm/s LVOT Vmean:  42.500 cm/s LVOT VTI:    0.100 m  AORTA Ao Root diam: 3.40 cm Ao Asc diam:  3.70 cm MITRAL VALVE                  TRICUSPID VALVE MV Area (PHT): 4.74 cm       TR Peak grad:   34.1 mmHg MV Decel Time: 160 msec       TR Vmax:        292.00 cm/s MR Peak grad:    90.6  mmHg MR Mean grad:    57.0 mmHg    SHUNTS MR  Vmax:         476.00 cm/s  Systemic VTI:  0.10 m MR Vmean:        346.0 cm/s   Systemic Diam: 2.20 cm MR PISA:         1.57 cm MR PISA Eff ROA: 11 mm MR PISA Radius:  0.50 cm MV E velocity: 103.00 cm/s Arvilla Meres MD Electronically signed by Arvilla Meres MD Signature Date/Time: 05-Nov-2022/4:16:02 PM    Final (Updated)    CT Angio Chest PE W and/or Wo Contrast  Result Date: 11/05/22 CLINICAL DATA:  Shortness of breath EXAM: CT ANGIOGRAPHY CHEST WITH CONTRAST TECHNIQUE: Multidetector CT imaging of the chest was performed using the standard protocol during bolus administration of intravenous contrast. Multiplanar CT image reconstructions and MIPs were obtained to evaluate the vascular anatomy. RADIATION DOSE REDUCTION: This exam was performed according to the departmental dose-optimization program which includes automated exposure control, adjustment of the mA and/or kV according to patient size and/or use of iterative reconstruction technique. CONTRAST:  60mL OMNIPAQUE IOHEXOL 350 MG/ML SOLN COMPARISON:  Chest radiographs done earlier today FINDINGS: Cardiovascular: Heart is enlarged in size. Extensive coronary artery calcifications are seen. RV LV ratio is 1.2. Contrast enhancement in the thoracic aorta is less than adequate to evaluate the lumen. There are intraluminal filling defects seen segmental and subsegmental pulmonary artery branches in right lower lobe. Mediastinum/Nodes: There are subcentimeter nodes in mediastinum and hilar regions. There is prominent pericardial recess. Lungs/Pleura: There are linear patchy infiltrates in right middle lobe and right lower lobe. Small right pleural effusion is seen. Upper Abdomen: Gallbladder stones are seen. Musculoskeletal: There is decrease in height of the bodies of T4 and T11 vertebrae without demonstrable break in the cortical margins. Findings may suggest old compression fractures. Review of the MIP  images confirms the above findings. IMPRESSION: There are intraluminal filling defects in segmental and subsegmental branches in right lower lobe with small thrombus burden. RV LV ratio is 1.2 which may be related to chronic right heart strain. Coronary artery disease. Cardiomegaly. Small right pleural effusion. There are patchy infiltrates in right middle lobe and right lower lobe suggesting atelectasis/pneumonia. There is decrease in height of the bodies of T4 and T11 vertebrae without demonstrable breaking the cortical margins suggesting possible old compression fractures. Gallbladder stones. Electronically Signed   By: Ernie Avena M.D.   On: 05-Nov-2022 12:15   CT ABDOMEN PELVIS W CONTRAST  Result Date: 11-05-22 CLINICAL DATA:  Abdominal pain EXAM: CT ABDOMEN AND PELVIS WITH CONTRAST TECHNIQUE: Multidetector CT imaging of the abdomen and pelvis was performed using the standard protocol following bolus administration of intravenous contrast. RADIATION DOSE REDUCTION: This exam was performed according to the departmental dose-optimization program which includes automated exposure control, adjustment of the mA and/or kV according to patient size and/or use of iterative reconstruction technique. CONTRAST:  60mL OMNIPAQUE IOHEXOL 350 MG/ML SOLN COMPARISON:  None Available. FINDINGS: Lower chest: Small right pleural effusion is seen. Patchy infiltrates are seen in right lower lung field. Coronary artery calcifications are seen. Hepatobiliary: There are multiple calcified gallbladder stones. There is no dilation of bile ducts. Pancreas: No focal abnormalities are seen. Spleen: Unremarkable. Adrenals/Urinary Tract: Adrenals are unremarkable. There is no hydronephrosis. There are no renal or ureteral stones. Urinary bladder is unremarkable. Stomach/Bowel: Stomach is not distended. Small bowel loops are not dilated. Appendix is not dilated. There is no significant wall thickening in colon. Scattered  diverticula seen in colon without signs of focal diverticulitis. Vascular/Lymphatic:  Calcifications are seen in aorta and its major branches. Reproductive: Prostate is enlarged. Other: There is no ascites or pneumoperitoneum. Small paraumbilical hernia containing fat is seen. Musculoskeletal: Degenerative changes are noted with disc space narrowing, bony spurs and encroachment of neural foramina at multiple levels. Degenerative changes are noted in both hips with joint space narrowing, bony spurs and subcortical cysts. Possible old bone infarct is seen in the shaft of left femur. IMPRESSION: There is no evidence of intestinal obstruction or pneumoperitoneum. There is no hydronephrosis. Appendix is unremarkable8. Gallbladder stones. Scattered diverticula are seen in colon without signs of focal diverticulitis. Enlarged prostate. Small right pleural effusion. Patchy infiltrates in the right lower lung field may suggest atelectasis/pneumonia. Coronary artery disease. Arteriosclerosis. Lumbar spondylosis. Marked degenerative changes are noted in both hips. Electronically Signed   By: Ernie Avena M.D.   On: 10/02/2022 11:59     Scheduled Meds:  Chlorhexidine Gluconate Cloth  6 each Topical Daily   ezetimibe  10 mg Oral Daily   gabapentin  300 mg Oral QHS   insulin aspart  0-15 Units Subcutaneous TID WC   insulin aspart  0-5 Units Subcutaneous QHS   insulin glargine-yfgn  15 Units Subcutaneous Daily   pneumococcal 20-valent conjugate vaccine  0.5 mL Intramuscular Tomorrow-1000   potassium chloride  40 mEq Oral Once   Continuous Infusions:  amiodarone 60 mg/hr (10/19/22 1032)   furosemide (LASIX) 200 mg in dextrose 5 % 100 mL (2 mg/mL) infusion     heparin 1,500 Units/hr (10/19/22 0700)   milrinone 0.25 mcg/kg/min (10/19/22 0700)     LOS: 2 days    Time spent:    Zannie Cove, MD [facility]   10/19/2022, 10:54 AM

## 2022-10-19 NOTE — Progress Notes (Signed)
Chaplain responded to Code Blue.  No family present.  Chaplain on standby if family arrives and needs support.  Vernell Morgans Chaplain

## 2022-10-19 NOTE — Code Documentation (Signed)
  Patient Name: Zachary Nolan   MRN: [id number]   Date of Birth/ Sex: 03/13/1941 , male      Admission Date: 10/01/2022  Attending Provider: Zannie Cove, MD  Primary Diagnosis: Atrial fibrillation with rapid ventricular response   Indication: Pt was in his usual state of health until this PM, when he was noted to be in respiratory arrest and lost a pulse. Code blue was subsequently called. At the time of arrival on scene, ACLS protocol was underway.   Technical Description:  - CPR performance duration:  16 minutes  - Was defibrillation or cardioversion used? No   - Was external pacer placed? Yes  - Was patient intubated pre/post CPR? Yes   Medications Administered: Y = Yes; Blank = No Amiodarone  Y  Atropine    Calcium  Y  Epinephrine  Y  Lidocaine    Magnesium    Norepinephrine    Phenylephrine    Sodium bicarbonate  Y  Vasopressin     Post CPR evaluation:  - Final Status - Was patient successfully resuscitated ? Yes - What is current rhythm? A fib - What is current hemodynamic status? Stable but critical  Miscellaneous Information:  - Labs sent, including: CBC, CMP, Mg, Trop, Lactic, ABG, CXR  - Primary team notified?  Yes  - Family Notified? Yes  - Additional notes/ transfer status: Transferred to 2 Heart, code team stayed until CCM arrived.     Rocky Morel, DO  10/19/2022, 10:45 PM

## 2022-10-19 NOTE — Progress Notes (Signed)
Pt personal belongings including glasses, cellphone, ipad, and chargers taken to 2H15 by Diplomatic Services operational officer, Ty.

## 2022-10-20 ENCOUNTER — Inpatient Hospital Stay (HOSPITAL_COMMUNITY): Payer: Medicare HMO

## 2022-10-20 DIAGNOSIS — I4891 Unspecified atrial fibrillation: Secondary | ICD-10-CM | POA: Diagnosis not present

## 2022-10-20 DIAGNOSIS — J90 Pleural effusion, not elsewhere classified: Secondary | ICD-10-CM | POA: Diagnosis not present

## 2022-10-20 DIAGNOSIS — J9690 Respiratory failure, unspecified, unspecified whether with hypoxia or hypercapnia: Secondary | ICD-10-CM | POA: Diagnosis not present

## 2022-10-20 DIAGNOSIS — J9811 Atelectasis: Secondary | ICD-10-CM | POA: Diagnosis not present

## 2022-10-20 LAB — POCT I-STAT 7, (LYTES, BLD GAS, ICA,H+H)
Acid-base deficit: 10 mmol/L — ABNORMAL HIGH (ref 0.0–2.0)
Acid-base deficit: 11 mmol/L — ABNORMAL HIGH (ref 0.0–2.0)
Bicarbonate: 15.6 mmol/L — ABNORMAL LOW (ref 20.0–28.0)
Bicarbonate: 16.9 mmol/L — ABNORMAL LOW (ref 20.0–28.0)
Calcium, Ion: 1.34 mmol/L (ref 1.15–1.40)
Calcium, Ion: 1.35 mmol/L (ref 1.15–1.40)
HCT: 30 % — ABNORMAL LOW (ref 39.0–52.0)
HCT: 31 % — ABNORMAL LOW (ref 39.0–52.0)
Hemoglobin: 10.2 g/dL — ABNORMAL LOW (ref 13.0–17.0)
Hemoglobin: 10.5 g/dL — ABNORMAL LOW (ref 13.0–17.0)
O2 Saturation: 100 %
O2 Saturation: 93 %
Patient temperature: 97.6
Patient temperature: 97.6
Potassium: 4.6 mmol/L (ref 3.5–5.1)
Potassium: 5.2 mmol/L — ABNORMAL HIGH (ref 3.5–5.1)
Sodium: 132 mmol/L — ABNORMAL LOW (ref 135–145)
Sodium: 132 mmol/L — ABNORMAL LOW (ref 135–145)
TCO2: 17 mmol/L — ABNORMAL LOW (ref 22–32)
TCO2: 18 mmol/L — ABNORMAL LOW (ref 22–32)
pCO2 arterial: 34.7 mmHg (ref 32–48)
pCO2 arterial: 38 mmHg (ref 32–48)
pH, Arterial: 7.253 — ABNORMAL LOW (ref 7.35–7.45)
pH, Arterial: 7.257 — ABNORMAL LOW (ref 7.35–7.45)
pO2, Arterial: 283 mmHg — ABNORMAL HIGH (ref 83–108)
pO2, Arterial: 76 mmHg — ABNORMAL LOW (ref 83–108)

## 2022-10-20 LAB — COOXEMETRY PANEL
Carboxyhemoglobin: 2.1 % — ABNORMAL HIGH (ref 0.5–1.5)
Methemoglobin: <0.7 % (ref 0.0–1.5)
O2 Saturation: 71.4 %
Total hemoglobin: 9.7 g/dL — ABNORMAL LOW (ref 12.0–16.0)

## 2022-10-20 LAB — BASIC METABOLIC PANEL
Anion gap: 18 — ABNORMAL HIGH (ref 5–15)
BUN: 42 mg/dL — ABNORMAL HIGH (ref 8–23)
CO2: 16 mmol/L — ABNORMAL LOW (ref 22–32)
Calcium: 9 mg/dL (ref 8.9–10.3)
Chloride: 96 mmol/L — ABNORMAL LOW (ref 98–111)
Creatinine, Ser: 2.92 mg/dL — ABNORMAL HIGH (ref 0.61–1.24)
GFR, Estimated: 21 mL/min — ABNORMAL LOW (ref >60)
Glucose, Bld: 462 mg/dL — ABNORMAL HIGH (ref 70–99)
Potassium: 5 mmol/L (ref 3.5–5.1)
Sodium: 130 mmol/L — ABNORMAL LOW (ref 135–145)

## 2022-10-20 LAB — COMPREHENSIVE METABOLIC PANEL
ALT: 41 U/L (ref 0–44)
AST: 69 U/L — ABNORMAL HIGH (ref 15–41)
Albumin: 3.1 g/dL — ABNORMAL LOW (ref 3.5–5.0)
Alkaline Phosphatase: 76 U/L (ref 38–126)
Anion gap: 16 — ABNORMAL HIGH (ref 5–15)
BUN: 50 mg/dL — ABNORMAL HIGH (ref 8–23)
CO2: 18 mmol/L — ABNORMAL LOW (ref 22–32)
Calcium: 10.4 mg/dL — ABNORMAL HIGH (ref 8.9–10.3)
Chloride: 100 mmol/L (ref 98–111)
Creatinine, Ser: 2.66 mg/dL — ABNORMAL HIGH (ref 0.61–1.24)
GFR, Estimated: 23 mL/min — ABNORMAL LOW (ref >60)
Glucose, Bld: 317 mg/dL — ABNORMAL HIGH (ref 70–99)
Potassium: 4 mmol/L (ref 3.5–5.1)
Sodium: 134 mmol/L — ABNORMAL LOW (ref 135–145)
Total Bilirubin: 0.8 mg/dL (ref 0.3–1.2)
Total Protein: 6.4 g/dL — ABNORMAL LOW (ref 6.5–8.1)

## 2022-10-20 LAB — MAGNESIUM
Magnesium: 2 mg/dL (ref 1.7–2.4)
Magnesium: 1.9 mg/dL (ref 1.7–2.4)

## 2022-10-20 LAB — URINE CULTURE: Culture: NO GROWTH

## 2022-10-20 LAB — TROPONIN I (HIGH SENSITIVITY)
Troponin I (High Sensitivity): 313 ng/L (ref <18)
Troponin I (High Sensitivity): 477 ng/L (ref <18)
Troponin I (High Sensitivity): 698 ng/L (ref <18)

## 2022-10-20 LAB — CBC
HCT: 29.7 % — ABNORMAL LOW (ref 39.0–52.0)
Hemoglobin: 9 g/dL — ABNORMAL LOW (ref 13.0–17.0)
MCH: 27.7 pg (ref 26.0–34.0)
MCHC: 30.3 g/dL (ref 30.0–36.0)
MCV: 91.4 fL (ref 80.0–100.0)
Platelets: 212 10*3/uL (ref 150–400)
RBC: 3.25 MIL/uL — ABNORMAL LOW (ref 4.22–5.81)
RDW: 13 % (ref 11.5–15.5)
WBC: 11.3 10*3/uL — ABNORMAL HIGH (ref 4.0–10.5)
nRBC: 0 % (ref 0.0–0.2)

## 2022-10-20 LAB — GLUCOSE, CAPILLARY
Glucose-Capillary: 200 mg/dL — ABNORMAL HIGH (ref 70–99)
Glucose-Capillary: 366 mg/dL — ABNORMAL HIGH (ref 70–99)
Glucose-Capillary: 318 mg/dL — ABNORMAL HIGH (ref 70–99)
Glucose-Capillary: 202 mg/dL — ABNORMAL HIGH (ref 70–99)
Glucose-Capillary: 181 mg/dL — ABNORMAL HIGH (ref 70–99)
Glucose-Capillary: 166 mg/dL — ABNORMAL HIGH (ref 70–99)

## 2022-10-20 LAB — LACTIC ACID, PLASMA
Lactic Acid, Venous: 4.1 mmol/L (ref 0.5–1.9)
Lactic Acid, Venous: 1.9 mmol/L (ref 0.5–1.9)
Lactic Acid, Venous: 1.7 mmol/L (ref 0.5–1.9)

## 2022-10-20 LAB — CULTURE, RESPIRATORY W GRAM STAIN

## 2022-10-20 LAB — HEPARIN LEVEL (UNFRACTIONATED): Heparin Unfractionated: 0.56 IU/mL (ref 0.30–0.70)

## 2022-10-20 LAB — CULTURE, BLOOD (ROUTINE X 2): Special Requests: ADEQUATE

## 2022-10-20 MED ORDER — GLYCOPYRROLATE 0.2 MG/ML IJ SOLN: 0.2000 mg | INTRAMUSCULAR | Status: DC | PRN

## 2022-10-20 MED ORDER — DEXTROSE 50 % IV SOLN: 0.0000 mL | INTRAVENOUS | Status: DC | PRN

## 2022-10-20 MED ORDER — SODIUM CHLORIDE 0.9 % IV SOLN
2.0000 g | INTRAVENOUS | Status: DC
  Administered 2022-10-20: 2 g via INTRAVENOUS
  Filled 2022-10-20: qty 12.5

## 2022-10-20 MED ORDER — MIDAZOLAM HCL 2 MG/2ML IJ SOLN: 4.0000 mg | Freq: Once | INTRAMUSCULAR | Status: DC

## 2022-10-20 MED ORDER — ORAL CARE MOUTH RINSE
15.0000 mL | OROMUCOSAL | Status: DC
  Administered 2022-10-20: 15 mL via OROMUCOSAL

## 2022-10-20 MED ORDER — ACETAMINOPHEN 650 MG RE SUPP: 650.0000 mg | Freq: Four times a day (QID) | RECTAL | Status: DC | PRN

## 2022-10-20 MED ORDER — MIDAZOLAM-SODIUM CHLORIDE 100-0.9 MG/100ML-% IV SOLN: 0.5000 mg/h | INTRAVENOUS | Status: DC

## 2022-10-20 MED ORDER — VANCOMYCIN HCL 1750 MG/350ML IV SOLN
1750.0000 mg | Freq: Once | INTRAVENOUS | Status: AC
  Administered 2022-10-20: 1750 mg via INTRAVENOUS
  Filled 2022-10-20: qty 350

## 2022-10-20 MED ORDER — SODIUM CHLORIDE 0.9 % IV SOLN: INTRAVENOUS | Status: DC

## 2022-10-20 MED ORDER — SODIUM BICARBONATE 8.4 % IV SOLN
50.0000 meq | Freq: Once | INTRAVENOUS | Status: AC
  Administered 2022-10-20: 50 meq via INTRAVENOUS
  Filled 2022-10-20: qty 50

## 2022-10-20 MED ORDER — ORAL CARE MOUTH RINSE: 15.0000 mL | OROMUCOSAL | Status: DC | PRN

## 2022-10-20 MED ORDER — NOREPINEPHRINE 4 MG/250ML-% IV SOLN
0.0000 ug/min | INTRAVENOUS | Status: DC
  Administered 2022-10-20: 8 ug/min via INTRAVENOUS
  Administered 2022-10-20: 5 ug/min via INTRAVENOUS
  Filled 2022-10-20: qty 250

## 2022-10-20 MED ORDER — MORPHINE BOLUS VIA INFUSION
5.0000 mg | INTRAVENOUS | Status: DC | PRN
  Administered 2022-10-20 (×3): 5 mg via INTRAVENOUS

## 2022-10-20 MED ORDER — DEXMEDETOMIDINE HCL IN NACL 400 MCG/100ML IV SOLN: 0.0000 ug/kg/h | INTRAVENOUS | Status: DC

## 2022-10-20 MED ORDER — ACETAMINOPHEN 325 MG PO TABS: 650.0000 mg | ORAL_TABLET | Freq: Four times a day (QID) | ORAL | Status: DC | PRN

## 2022-10-20 MED ORDER — VANCOMYCIN VARIABLE DOSE PER UNSTABLE RENAL FUNCTION (PHARMACIST DOSING): Status: DC

## 2022-10-20 MED ORDER — INSULIN ASPART 100 UNIT/ML IJ SOLN: 2.0000 [IU] | INTRAMUSCULAR | Status: DC

## 2022-10-20 MED ORDER — MIDAZOLAM HCL 2 MG/2ML IJ SOLN
2.0000 mg | INTRAMUSCULAR | Status: DC | PRN
  Filled 2022-10-20: qty 4

## 2022-10-20 MED ORDER — INSULIN REGULAR(HUMAN) IN NACL 100-0.9 UT/100ML-% IV SOLN
INTRAVENOUS | Status: DC
  Administered 2022-10-20: 11 [IU]/h via INTRAVENOUS
  Filled 2022-10-20: qty 100

## 2022-10-20 MED ORDER — GLYCOPYRROLATE 0.2 MG/ML IJ SOLN
0.2000 mg | INTRAMUSCULAR | Status: DC | PRN
  Administered 2022-10-20: 0.2 mg via INTRAVENOUS
  Filled 2022-10-20: qty 1

## 2022-10-20 MED ORDER — INSULIN GLARGINE-YFGN 100 UNIT/ML ~~LOC~~ SOLN
12.0000 [IU] | Freq: Two times a day (BID) | SUBCUTANEOUS | Status: DC
  Filled 2022-10-20 (×2): qty 0.12

## 2022-10-20 MED ORDER — GLYCOPYRROLATE 1 MG PO TABS: 1.0000 mg | ORAL_TABLET | ORAL | Status: DC | PRN

## 2022-10-20 MED ORDER — POLYVINYL ALCOHOL 1.4 % OP SOLN: 1.0000 [drp] | Freq: Four times a day (QID) | OPHTHALMIC | Status: DC | PRN

## 2022-10-20 MED ORDER — MORPHINE 100MG IN NS 100ML (1MG/ML) PREMIX INFUSION
0.0000 mg/h | INTRAVENOUS | Status: DC
  Administered 2022-10-20: 5 mg/h via INTRAVENOUS
  Filled 2022-10-20: qty 100

## 2022-10-21 LAB — CULTURE, BLOOD (ROUTINE X 2)
Culture: NO GROWTH
Special Requests: ADEQUATE

## 2022-10-21 LAB — CULTURE, RESPIRATORY W GRAM STAIN

## 2022-10-21 NOTE — Progress Notes (Signed)
Patient hand off received from [facility] nurse at 2230. Patient no longer meets criteria for left and right soft wrist restraints. Orders discontinued.

## 2022-10-22 LAB — CULTURE, BLOOD (ROUTINE X 2): Culture: NO GROWTH

## 2022-10-24 LAB — CULTURE, RESPIRATORY W GRAM STAIN

## 2022-10-26 NOTE — Progress Notes (Signed)
Sputum culture sent down to the lab.

## 2022-10-26 NOTE — Progress Notes (Signed)
NAME:  Zachary Nolan, MRN:  [id number], DOB:  1940-09-28, LOS: 3 ADMISSION DATE:  10/05/2022, CONSULTATION DATE:  10/19/22 REFERRING MD:  Joya Martyr CHIEF COMPLAINT:  Cardiac Arrest   History of Present Illness:  Pt is encephelopathic; therefore, this HPI is obtained from chart review.  Zachary Nolan is a 82 y.o. male who has a PMH as below including but not limited to sCHF, CAD s/p STEMI 2011 s/p PCI with DES to RCA, ICM, HTN, HLD, DM, OSA. He was admitted to [facility] 4/22 with acute CHF exacerbation, new A.fib with RVR, and RLL PE. Echo 4/22 demonstrated severe biventricular failure with LVEF 20-25%, severe MR, moderately calcified AoV and mod to severe AS, global hypokinesis, mod concentric LVH, severely reduced RV function with mod elevated RSVP. He was started on heparin, lasix, and Cardizem; however, Cardizem was stopped after his echo revealed worsening EF and he was started on Amiodarone. Milrinone also added after echo results were noted. LE duplex 4/23 demonstrate LLE DVT.  Overnight 4/24, he developed respiratory arrest leading to PEA. He received 4 epi, amio 150, 1 bicarb, 1 calcium. He required intubation for airway protection. Supposedly roughly 15 - 20 minutes of CPR; though he has been somewhat purposeful after arrival to ICU; therefore, unsure if truly was PEA for the duration.  PCCM subsequently called for ICU transfer.  Upon review of chart, pt had discussed DNR with CHF team (Dr. Shirlee Latch) and wanted to discuss further with wife per notes 4/23. Palliative care was consulted and they planned to have a family meeting on 4/25 for goals of care.  After pt arrived in ICU, I managed to reach his wife, Zachary Nolan over the phone. She was understandably upset of the fact that he suffered an arrest, but she assured me that pt had told her that he would never want heroic measures or life support machines to keep him alive. She understands that he has already been placed on a ventilator tonight and is OK with  keeping things as is until family has a chance to visit with pt. In the interim, we will continue supportive care but family is against escalating things to include further life sustaining measures in the event of further decline.   Pertinent  Medical History:  has HYPERCHOLESTEROLEMIA; Coronary artery disease involving native coronary artery of native heart without angina pectoris; SINOATRIAL NODE DYSFUNCTION; Essential hypertension, benign; OSA (obstructive sleep apnea); Obesity; Chest pain, rule out acute myocardial infarction; Uncontrolled type 2 diabetes mellitus with hyperglycemia, without long-term current use of insulin; CKD (chronic kidney disease) stage 3, GFR 30-59 ml/min; AKI (acute kidney injury); SOB (shortness of breath); Elevated troponin; Hyperlipidemia; Atrial fibrillation with rapid ventricular response; Acute on chronic combined systolic and diastolic CHF (congestive heart failure); Multiple subsegmental pulmonary emboli without acute cor pulmonale; CAP (community acquired pneumonia); Hyponatremia; Respiratory arrest; Biventricular failure; and DNR (do not resuscitate) on their problem list.  Significant Hospital Events: Including procedures, antibiotic start and stop dates in addition to other pertinent events   4/22 admit 4/24 respiratory leading to PEA arrest, intubated and transferred to ICU. 4/24 discussion with spouse over the phone, DNR issued  Interim History / Subjective:  On vent. Does follow commands with enough prompting.  Objective:  Blood pressure (!) 91/58, pulse (!) 110, temperature 98.6 F (37 C), temperature source Axillary, resp. rate 17, height  (1.753 m), weight 87.3 kg, SpO2 97 %. CVP:  [12 mmHg-25 mmHg] 12 mmHg  Vent Mode: PRVC FiO2 (%):  [40 %-100 %]  40 % Set Rate:  [18 bmp] 18 bmp Vt Set:  [560 mL] 560 mL PEEP:  [8 cmH20] 8 cmH20 Plateau Pressure:  [17 cmH20-24 cmH20] 18 cmH20   Intake/Output Summary (Last 24 hours) at 10/02/2022 1034 Last  data filed at 09/30/2022 0900 Gross per 24 hour  Intake 2390.14 ml  Output 98 ml  Net 2292.14 ml    Filed Weights   10/18/22 0215 10/19/22 0328 10/15/2022 0500  Weight: 90 kg 88.3 kg 87.3 kg    Examination: Frail elderly man in NAD ETT in place, minimal secretions With enough prompting will give weak movement to command Ext warm No edema  Sugars look ok Current gtt insulin, milrinone, levo Coox 71 Trops drifting up current 700 WBC up slightly CXR personally reviewed: benign  Assessment & Plan:  IHCA Low flow AS Functional severe MR Severe cardiomyopathy DM2 with hyperglycemia AKI on CKD3 not dialysis candidate  Discussed with family at bedside, patient would not have wanted this level of support and would have wanted a natural peaceful passing given present circumstances.  We will initiate morphine titrated to comfort then allow natural death.  Vent support to be withdrawn later today.  Case discussed with PMT and CHF team.  31 min cc time  Myrla Halsted MD PCCM

## 2022-10-26 NOTE — Progress Notes (Signed)
Date and time results received: 10/19/2022  0110  Test: Lactic Acid Critical Value: 4.1  Name of Provider Notified: Pola Corn

## 2022-10-26 NOTE — Accreditation Note (Signed)
Restraint death within 24 hours of removal of 2 point soft restraints logged on 10/21/2022 at 1200 by Hannia Matchett RNBSN.

## 2022-10-26 NOTE — Progress Notes (Signed)
Palliative:  Met with nursing staff this AM. Spoke with Dr. Katrinka Blazing later - he had met with family and transitioned to comfort measures. No PMT interventions needed.   Please call if PMT needs arise.  Gerlean Ren, DNP, AGNP-C Palliative Medicine Team Team Phone # [phone]  Pager # [phone]  NO CHARGE

## 2022-10-26 NOTE — Death Summary Note (Addendum)
Death Summary  Zachary Nolan ZOX:[id number] DOB: Nov 26, 1940 DOA: 2022/11/15  PCP: Linus Galas, NP   Admit date: 11-15-2022 Date of Death: 18-Nov-2022  Final Diagnoses:  Acute biventricular heart failure Low output heart failure PEA arrest Moderate to severe mitral regurgitation Moderate aortic stenosis Acute PE, DVT Delirium, agitation A-fib with RVR CAD   Elevated troponin   CAP (community acquired pneumonia)   Uncontrolled type 2 diabetes mellitus with hyperglycemia, without long-term current use of insulin   CKD (chronic kidney disease) stage 3, GFR 30-59 ml/min   Hyponatremia   OSA (obstructive sleep apnea)   Respiratory arrest   Biventricular failure   DNR (do not resuscitate)    History of present illness:  82 y.o. male with medical history significant of hypertension, hyperlipidemia, CAD s/p DES to RCA in 2011, diabetes mellitus type 2, and BPH who presented with complaints of shortness of breath is worsened over the last 2 weeks.  Upon admission patient diagnosed with pulmonary embolism with CHF, ECHo noted severe biventricular failure, EF 25%, moderate to severe MR and moderate aortic stenosis, with low output heart failure also noted to have new A-fib with RVR   Hospital Course:   Acute on chronic systolic CHF, BiV failure Mitral regurgitation, moderate to severe Moderate aortic stenosis -Known ischemic cardiomyopathy, previously EF 35-40%, recovered EF in 5/23 -Now admitted with severe biventricular failure, low output failure -Echo this admit noted EF 25%, moderately reduced RV function, moderate AS, moderate to severe MR -Advanced heart failure team was consulted, he was started on IV milrinone drip with Lasix along with IV amiodarone and heparin gtt. for A-fib RVR  -Palliative care was also consulted on account of severe BiV failure, agitation and delirium at age 74  -Overnight 4/24 patient had a PEA arrest, CPR was performed for 16 minutes, intubated and subsequently  transferred to the ICU, postarrest he had significant worsening renal failure with poor urine output, possible aspiration pneumonia,  -this morning after discussions with family, critical care and advanced heart failure team, felt to have very poor prognosis with low output heart failure and progressive AKI, decision was made to withdraw care, he was not felt to be a dialysis candidate. -He was extubated for comfort and subsequently expired   New A-fib with RVR -Was on amiodarone and heparin gtt.   Acute DVT/PE -On heparin gtt.   Agitation, delirium -Could be secondary to hospitalization, UA from 2022/11/15 was unremarkable, no fevers or leukocytosis -Xanax could also be contributing, DC benzos -Likely secondary to low output and hospital delirium   History of CAD Elevated troponin likely secondary to demand ischemia -Prior RCA stent in 2011   Uncontrolled type 2 diabetes mellitus -HbA1c is 12.5, increased glargine dose   AKI on CKD 3B -Now comfort care   OSA -History of sleep apnea, unable to tolerate CPAP prior to admission  Time:  Signed:  Zannie Cove  [facility] 11/18/22, 11:58 AM

## 2022-10-26 NOTE — Progress Notes (Signed)
eLink Physician-Brief Progress Note Patient Name: Zachary Nolan DOB: 12-Aug-1940 MRN: [id number]   Date of Service  10/16/2022  HPI/Events of Note  Lactic acid 4.1, Troponin 313, status post cardiac arrest with CPR.  eICU Interventions  Will trend lactic acid and Troponin.        Thomasene Lot Lakedra [place] 10/22/2022, 1:33 AM

## 2022-10-26 NOTE — Progress Notes (Signed)
eLink Physician-Brief Progress Note Patient Name: Idan Prime DOB: 1940-12-25 MRN: [id number]   Date of Service  10/16/2022  HPI/Events of Note  Patient with sub-optimal glycemic control.  eICU Interventions  Hyperglycemia phase 2 protocol ordered.        Thomasene Lot Posie Lillibridge 10/09/2022, 4:15 AM

## 2022-10-26 NOTE — Progress Notes (Signed)
Patient ID: Zachary Nolan, male   DOB: [birth year], 82 y.o.   MRN: [id number]     Advanced Heart Failure Rounding Note  PCP-Cardiologist: Donato Schultz, MD   Subjective:    4/23: PICC placed. Initial Co-ox 51%. Stated on Milrinone 0.25.  Venous dopplers + for Lt LE DVT (left posterior tibial veins) 4/24: Poor urine output over the day, had PEA arrest thought to be respiratory in nature overnight.  Had 16 minutes CPR and epinephrine with ROSC, no shock.   Patient is on vent post-PEA arrest.  CXR with right base PNA.  Lactate 4.1 => 1.7.  Covering aspiration PNA with vancomycin/cefepime.  HE is now on milrinone 0.125, NE 8, Lasix 12, amiodarone 60.  He is not making urine.  SBP 80s.  Co-ox 71%. CVP 12-13.   Scr continues to trend up, 1.63>>1.77>>1.92>>2.92  Remains in Afib 100s. On amio gtt + heparin.    Objective:   Weight Range: 87.3 kg Body mass index is 28.42 kg/m.   Vital Signs:   Temp:  [96.5 F (35.8 C)-98.6 F (37 C)] 98.6 F (37 C) 11/11/22 0830) Pulse Rate:  [82-129] 110 Nov 11, 2022 0830) Resp:  [16-34] 17 Nov 11, 2022 0830) BP: (73-118)/(48-89) 91/58 11/11/22 0830) SpO2:  [79 %-100 %] 97 % 11-11-2022 0830) FiO2 (%):  [40 %-100 %] 40 % 11/11/2022 0801) Weight:  [87.3 kg] 87.3 kg 11-11-2022 0500) Last BM Date : 10/18/22  Weight change: Filed Weights   10/18/22 0215 10/19/22 0328 2022-11-11 0500  Weight: 90 kg 88.3 kg 87.3 kg    Intake/Output:   Intake/Output Summary (Last 24 hours) at 11/11/2022 0924 Last data filed at 11-11-22 0900 Gross per 24 hour  Intake 2390.14 ml  Output 93 ml  Net 2297.14 ml      Physical Exam    CVP 12-13 General: on vent, frail Neck: JVP 12-14 cm, no thyromegaly or thyroid nodule.  Lungs: Decreased at bases. CV: Nondisplaced PMI.  Heart irregular S1/S2, no S3/S4, no murmur.  1+ ankle edema.   Abdomen: Soft, nontender, no hepatosplenomegaly, no distention.  Skin: Intact without lesions or rashes.  Neurologic: Alert and oriented x 3.  Psych: Normal  affect. Extremities: No clubbing or cyanosis.  HEENT: Normal.   Telemetry   Atrial fibrillation 100s (personally reviewed)   EKG    No new EKG to review   Labs    CBC Recent Labs    10/19/22 2243 Nov 11, 2022 0008 11/11/22 0223 2022/11/11 0258  WBC 5.2  --   --  11.3*  NEUTROABS 3.6  --   --   --   HGB 10.3*   < > 10.2* 9.0*  HCT 32.3*   < > 30.0* 29.7*  MCV 89.7  --   --  91.4  PLT 248  --   --  212   < > = values in this interval not displayed.   Basic Metabolic Panel Recent Labs    62/95/28 2243 11-Nov-2022 0008 Nov 11, 2022 0223 Nov 11, 2022 0258  NA 134*   < > 132* 130*  K 4.0   < > 5.2* 5.0  CL 100  --   --  96*  CO2 18*  --   --  16*  GLUCOSE 317*  --   --  462*  BUN 50*  --   --  42*  CREATININE 2.66*  --   --  2.92*  CALCIUM 10.4*  --   --  9.0  MG 2.0  --   --  1.9   < > =  values in this interval not displayed.   Liver Function Tests Recent Labs    10/19/22 1410 10/19/22 2243  AST 17 69*  ALT 14 41  ALKPHOS 70 76  BILITOT 1.0 0.8  PROT 6.1* 6.4*  ALBUMIN 3.1* 3.1*   Recent Labs    10/15/2022 1013  LIPASE 30   Cardiac Enzymes No results for input(s): "CKTOTAL", "CKMB", "CKMBINDEX", "TROPONINI" in the last 72 hours.  BNP: BNP (last 3 results) Recent Labs    11/05/21 1821 10/07/2022 0850  BNP 139.3* 761.8*    ProBNP (last 3 results) No results for input(s): "PROBNP" in the last 8760 hours.   D-Dimer Recent Labs    09/28/2022 1002  DDIMER 2.44*   Hemoglobin A1C Recent Labs    10/22/2022 1534  HGBA1C 12.5*   Fasting Lipid Panel Recent Labs    10/18/22 0642  CHOL 208*  HDL 31*  LDLCALC 162*  TRIG 74  CHOLHDL 6.7   Thyroid Function Tests Recent Labs    10/18/22 0642  TSH 0.733    Other results:   Imaging    Portable Chest xray  Result Date: 10/19/2022 CLINICAL DATA:  Respiratory failure. EXAM: PORTABLE CHEST 1 VIEW COMPARISON:  10/19/2022 FINDINGS: The endotracheal tube is 2.3 cm above the carina. The right PICC line is in  good position, unchanged. External pacer paddles again demonstrated. The heart is mildly enlarged but unchanged. Persistent vascular congestion, small effusions and streaky areas of subsegmental atelectasis. IMPRESSION: 1. Stable support apparatus. 2. Persistent vascular congestion, small effusions and streaky areas of subsegmental atelectasis. Electronically Signed   By: Rudie Meyer M.D.   On: 10/05/2022 08:47   DG Abd 1 View  Result Date: 10/10/2022 CLINICAL DATA:  OG tube placement EXAM: ABDOMEN - 1 VIEW COMPARISON:  None Available. FINDINGS: OG tube tip projects over the right medial lower chest. This is suspicious for location in the right lower lobe bronchus. Nonobstructive bowel gas pattern. IMPRESSION: OG tube tip projects over the right medial lung base concerning for placement in the right lower lobe. Recommend complete removal and replacement. These results were called by telephone at the time of interpretation on 10/18/2022 at 12:15 am to patient's nurse [place], who verbally acknowledged these results. Electronically Signed   By: Charlett Nose M.D.   On: 10/07/2022 00:15   DG CHEST PORT 1 VIEW  Result Date: 10/19/2022 CLINICAL DATA:  Respiratory failure EXAM: PORTABLE CHEST 1 VIEW COMPARISON:  10/19/2022 FINDINGS: Endotracheal tube is seen 5.4 cm above the carina. Right upper extremity PICC line tip noted at the superior cavoatrial junction. Linear atelectasis or scar noted within the right mid lung zone. Patchy consolidation within the medial right lung base, progressive since prior examination. Mild cardiomegaly is stable. Pulmonary vascularity is normal. No acute bone abnormality. IMPRESSION: 1. Support lines and tubes in appropriate position. 2. Progressive medial right basilar consolidation, atelectasis versus infiltrate. 3. Stable cardiomegaly Electronically Signed   By: Helyn Numbers M.D.   On: 10/19/2022 23:30   DG CHEST PORT 1 VIEW  Result Date: 10/19/2022 CLINICAL DATA:  Congestive  failure EXAM: PORTABLE CHEST 1 VIEW COMPARISON:  10/18/2022 FINDINGS: Cardiac shadow is enlarged but stable. No significant vascular congestion is seen. No edema is noted. Right PICC is seen in satisfactory position. Bony abnormality is noted. IMPRESSION: No acute abnormality noted. Electronically Signed   By: Alcide Clever M.D.   On: 10/19/2022 13:06     Medications:     Scheduled Medications:  Chlorhexidine Gluconate Cloth  6 each Topical Daily   docusate  100 mg Per Tube BID   ezetimibe  10 mg Oral Daily   gabapentin  300 mg Oral QHS   insulin glargine-yfgn  12 Units Subcutaneous BID   mouth rinse  15 mL Mouth Rinse Q2H   pneumococcal 20-valent conjugate vaccine  0.5 mL Intramuscular Tomorrow-1000   polyethylene glycol  17 g Per Tube Daily   vancomycin variable dose per unstable renal function (pharmacist dosing)   Does not apply See admin instructions    Infusions:  amiodarone 60 mg/hr (10/12/2022 0900)   ceFEPime (MAXIPIME) IV Stopped (10/19/2022 0647)   furosemide (LASIX) 200 mg in dextrose 5 % 100 mL (2 mg/mL) infusion 12 mg/hr (10/12/2022 0900)   heparin 1,500 Units/hr (10/06/2022 0900)   insulin 4.4 Units/hr (10/15/2022 0900)   milrinone 0.125 mcg/kg/min (09/28/2022 0900)   norepinephrine (LEVOPHED) Adult infusion 10 mcg/min (10/09/2022 0900)    PRN Medications: acetaminophen **OR** acetaminophen, dextrose, fentaNYL (SUBLIMAZE) injection, fentaNYL (SUBLIMAZE) injection, guaiFENesin, hydrALAZINE, levalbuterol, midazolam, ondansetron (ZOFRAN) IV, mouth rinse, senna-docusate, sodium chloride flush    Patient Profile   82 y/o male w/ CAD s/p inferior STEMI in 2011 w/ PCI to RCA, residual mod LAD and LCx treated medically, h/o systolic heart failure w/ improved EF, Aortic Stenosis, T2DM, HTN, HLD, CKD IIIb and untreated OSA, admitted w/ acute systolic heart failure, new Afib w/ RVR and acute PE.   Assessment/Plan   1. Acute on chronic systolic CHF: Patient had STEMI in 2011 with ischemic  cardiomyopathy at that time, EF 35-40%.  In 5/23, EF up to 60-65%.  This admission, echo showed EF 25%, mid-moderate RV dysfunction, low flow/low gradient moderate AS, mod-severe MR, dilated IVC.  Cause for fall in EF uncertain.  Possibly due to atrial fibrillation with RVR, uncertain how long he has been in AF.  It is also possible that he has had worsening of CAD with ischemic CMP.  However, he did not present with ACS and denies chest pain.  Admitted w/ volume overload and low output. Initial poor response to IV diuretics w/ increasing SCr. PICC placed. Initial Co-ox 51%. Started on Milrinone 0.25 mcg/kg/min. Had PEA arrest overnight 4/24, now intubated and on milrinone 0.25 and NE 8.  SBP 80s.  No UOP overnight and creatinine up to 2.9.  Co-ox 71% today with CVP 12-13.  - Decrease milrinone to 0.125 with low BP and titrate up NE as needed.  - Continue Lasix gtt for now but minimal response.  - Discussed situation with wife, poor prognosis at this point.  No UOP and creatinine rising rapidly, if we are going to proceed aggressively, likely will need CVVH.  Patient's wife states that he would not want to remain intubated or go on dialysis.  She is coming in with family, most likely will proceed with comfort care.  Palliative care service following.  2. Atrial fibrillation: New diagnosis, admitted with RVR of uncertain duration.  Possible tachy-mediated CMP.  HR 100s today on amiodarone 60 mg/hr.  - Continue heparin gtt.  - Amiodarone gtt 60 mg/hr.  - Ideally would get him to TEE/DCCV, but see above regarding likely move to comfort care.  3. PE/DVT: Acute PE, RLL.  Patient's wife says that he has been essentially immobile for the last 2 wks.  No h/o VTE.  Venous dopplers + for Lt LE DVT (left posterior tibial veins) - Heparin gtt.  4. CAD: H/o inferior STEMI in 2011 with DES to distal RCA. No chest pain  this admission, troponin mildly elevated with no trend.  Doubt ACS.  As above, concern that worsening  CAD could account for fall in EF.  - Unable to tolerate statins, added Zetia 10 mg daily. - Would need eventual coronary angiography if proceeding with aggressive care.  5. AKI on CKD stage 3: Creatinine 1.6 => 1.77 => 2.92.  Minimal UOP during day yesterday and none since PEA arrest last night.  Discussed with wife, he would not want dialysis.  - Probable move to comfort care.  6. Aortic stenosis: Low flow/low gradient moderate AS on echo, follow. 7. Mitral regurgitation: Moderate-severe, probably functional.   8. Agitation/ Delerium: Now intubated/sedated.  9. PEA arrest: Suspect respiratory in nature.  No shockable rhythm. Had epinephrine, 16 minutes CPR with ROSC.  Poor prognosis with low output HF and progressive AKI.   - Discussed situation with his wife, patient would not want to remain intubated or have HD.  Likely will move to comfort care.  Family to come in today, palliative care involved.   CRITICAL CARE Performed by: Marca Ancona  Total critical care time: 45 minutes  Critical care time was exclusive of separately billable procedures and treating other patients.  Critical care was necessary to treat or prevent imminent or life-threatening deterioration.  Critical care was time spent personally by me on the following activities: development of treatment plan with patient and/or surrogate as well as nursing, discussions with consultants, evaluation of patient's response to treatment, examination of patient, obtaining history from patient or surrogate, ordering and performing treatments and interventions, ordering and review of laboratory studies, ordering and review of radiographic studies, pulse oximetry and re-evaluation of patient's condition.    Length of Stay: 3  Marca Ancona, MD  10/23/2022, 9:24 AM  Advanced Heart Failure Team Pager [phone] (M-F; 7a - 5p)  Please contact [facility] for night-coverage after hours (5p -7a ) and weekends on [email or web address]

## 2022-10-26 NOTE — Progress Notes (Signed)
Pharmacy Antibiotic Note  Zachary Nolan is a 82 y.o. male admitted on 10/06/2022 with SOB with new-onset Afib and multiple PE, now w/ concern for pneumonia.  Pharmacy has been consulted for vancomycin and cefepime dosing.  Pt w/ AKI; baseline SCr ~1.6, now 2.66.  Plan: Vancomycin  IV x1; monitor SCr +/- vanc levels for further dosing. Cefepime 2g IV Q24H.  Height:  (175.3 cm) Weight: 88.3 kg (194 lb 10.7 oz) IBW/kg (Calculated) : 70.7  Temp (24hrs), Avg:98.1 F (36.7 C), Min:96.5 F (35.8 C), Max:98.6 F (37 C)  Recent Labs  Lab 09/30/2022 0850 10/11/2022 1230 10/19/2022 1534 10/18/22 0642 10/19/22 0424 10/19/22 1410 10/19/22 1527 10/19/22 2243  WBC 9.5  --   --  7.6 7.8 8.3  --  5.2  CREATININE 1.63*  --   --  1.77* 1.94* 2.29* 2.40* 2.66*  LATICACIDVEN  --  1.6 2.0*  --   --  1.2  --  4.1*    Estimated Creatinine Clearance: 23.1 mL/min (A) (by C-G formula based on SCr of 2.66 mg/dL (H)).    Allergies  Allergen Reactions   Shellfish-Derived Products Swelling    JUST CRAB MEAT   Statins Other (See Comments)    Dizziness and myalgias    Thank you for allowing pharmacy to be a part of this patient's care.  Vernard Gambles, PharmD, BCPS  10/19/2022 2:05 AM

## 2022-10-26 NOTE — Procedures (Signed)
Extubation Procedure Note  Patient Details:   Name: Zachary Nolan DOB: [birth year] MRN: [id number]   Airway Documentation:    Vent end date: 09/30/2022 Vent end time: 1101   Evaluation   Patient extubated per comfort care measures. RN and family at bedside.  10/10/2022, 11:01 AM

## 2022-10-26 NOTE — Progress Notes (Signed)
eLink Physician-Brief Progress Note Patient Name: Zachary Nolan DOB: [birth year] MRN: [id number]   Date of Service  10/16/2022  HPI/Events of Note  BP 69/49  eICU Interventions  Norepinephrine gtt ordered, Sodium Bicarbonate 50 meq iv x 1.        Migdalia Dk 10/22/2022, 5:43 AM

## 2022-10-26 DEATH — deceased

## 2022-11-08 MED FILL — Medication: Qty: 1 | Status: AC
